# Patient Record
Sex: Female | Born: 1935 | Race: White | Hispanic: No | Smoking: Never smoker
Health system: Southern US, Community
[De-identification: ages and names within clinical notes are randomized; demographics above are authoritative.]

## PROBLEM LIST (undated history)

## (undated) DIAGNOSIS — M545 Low back pain, unspecified: Secondary | ICD-10-CM

## (undated) DIAGNOSIS — I10 Essential (primary) hypertension: Secondary | ICD-10-CM

## (undated) DIAGNOSIS — G459 Transient cerebral ischemic attack, unspecified: Secondary | ICD-10-CM

## (undated) DIAGNOSIS — I4510 Unspecified right bundle-branch block: Secondary | ICD-10-CM

## (undated) DIAGNOSIS — I639 Cerebral infarction, unspecified: Secondary | ICD-10-CM

## (undated) DIAGNOSIS — F99 Mental disorder, not otherwise specified: Secondary | ICD-10-CM

## (undated) DIAGNOSIS — R413 Other amnesia: Secondary | ICD-10-CM

## (undated) DIAGNOSIS — R51 Headache: Secondary | ICD-10-CM

## (undated) DIAGNOSIS — E785 Hyperlipidemia, unspecified: Secondary | ICD-10-CM

## (undated) DIAGNOSIS — R296 Repeated falls: Secondary | ICD-10-CM

## (undated) DIAGNOSIS — R519 Headache, unspecified: Secondary | ICD-10-CM

## (undated) DIAGNOSIS — Z9289 Personal history of other medical treatment: Secondary | ICD-10-CM

## (undated) DIAGNOSIS — R269 Unspecified abnormalities of gait and mobility: Secondary | ICD-10-CM

## (undated) DIAGNOSIS — M1990 Unspecified osteoarthritis, unspecified site: Secondary | ICD-10-CM

## (undated) DIAGNOSIS — C801 Malignant (primary) neoplasm, unspecified: Secondary | ICD-10-CM

## (undated) DIAGNOSIS — I480 Paroxysmal atrial fibrillation: Secondary | ICD-10-CM

## (undated) DIAGNOSIS — R410 Disorientation, unspecified: Secondary | ICD-10-CM

## (undated) DIAGNOSIS — R531 Weakness: Secondary | ICD-10-CM

## (undated) DIAGNOSIS — J029 Acute pharyngitis, unspecified: Secondary | ICD-10-CM

## (undated) HISTORY — DX: Acute pharyngitis, unspecified: J02.9

## (undated) HISTORY — DX: Hyperlipidemia, unspecified: E78.5

## (undated) HISTORY — DX: Unspecified osteoarthritis, unspecified site: M19.90

## (undated) HISTORY — DX: Disorientation, unspecified: R41.0

## (undated) HISTORY — PX: WRIST SURGERY: SHX841

## (undated) HISTORY — PX: OTHER SURGICAL HISTORY: SHX169

## (undated) HISTORY — DX: Headache, unspecified: R51.9

## (undated) HISTORY — PX: ABDOMINAL HYSTERECTOMY: SHX81

## (undated) HISTORY — DX: Low back pain, unspecified: M54.50

## (undated) HISTORY — DX: Essential (primary) hypertension: I10

## (undated) HISTORY — DX: Low back pain: M54.5

## (undated) HISTORY — PX: BACK SURGERY: SHX140

## (undated) HISTORY — DX: Unspecified right bundle-branch block: I45.10

## (undated) HISTORY — PX: MASTECTOMY: SHX3

## (undated) HISTORY — DX: Headache: R51

## (undated) HISTORY — DX: Malignant (primary) neoplasm, unspecified: C80.1

## (undated) HISTORY — PX: BREAST SURGERY: SHX581

## (undated) HISTORY — DX: Transient cerebral ischemic attack, unspecified: G45.9

## (undated) HISTORY — DX: Unspecified abnormalities of gait and mobility: R26.9

## (undated) HISTORY — DX: Paroxysmal atrial fibrillation: I48.0

## (undated) HISTORY — DX: Other amnesia: R41.3

## (undated) HISTORY — DX: Weakness: R53.1

## (undated) HISTORY — DX: Repeated falls: R29.6

## (undated) HISTORY — DX: Personal history of other medical treatment: Z92.89

---

## 1998-04-16 DIAGNOSIS — Z9289 Personal history of other medical treatment: Secondary | ICD-10-CM

## 1998-04-16 HISTORY — DX: Personal history of other medical treatment: Z92.89

## 1999-05-25 ENCOUNTER — Encounter: Admission: RE | Admit: 1999-05-25 | Discharge: 1999-05-25 | Payer: Self-pay | Admitting: Family Medicine

## 1999-05-25 ENCOUNTER — Encounter: Payer: Self-pay | Admitting: Family Medicine

## 1999-06-01 ENCOUNTER — Encounter: Admission: RE | Admit: 1999-06-01 | Discharge: 1999-06-01 | Payer: Self-pay | Admitting: Family Medicine

## 1999-06-01 ENCOUNTER — Encounter: Payer: Self-pay | Admitting: Family Medicine

## 2000-06-20 ENCOUNTER — Encounter: Admission: RE | Admit: 2000-06-20 | Discharge: 2000-06-20 | Payer: Self-pay | Admitting: Family Medicine

## 2000-06-20 ENCOUNTER — Encounter: Payer: Self-pay | Admitting: Family Medicine

## 2000-07-01 ENCOUNTER — Other Ambulatory Visit: Admission: RE | Admit: 2000-07-01 | Discharge: 2000-07-01 | Payer: Self-pay | Admitting: Family Medicine

## 2000-07-01 ENCOUNTER — Encounter: Payer: Self-pay | Admitting: Family Medicine

## 2000-07-01 ENCOUNTER — Encounter: Admission: RE | Admit: 2000-07-01 | Discharge: 2000-07-01 | Payer: Self-pay | Admitting: Family Medicine

## 2000-07-08 ENCOUNTER — Encounter: Admission: RE | Admit: 2000-07-08 | Discharge: 2000-07-08 | Payer: Self-pay | Admitting: General Surgery

## 2000-07-08 ENCOUNTER — Encounter: Payer: Self-pay | Admitting: General Surgery

## 2000-07-09 ENCOUNTER — Ambulatory Visit (HOSPITAL_BASED_OUTPATIENT_CLINIC_OR_DEPARTMENT_OTHER): Admission: RE | Admit: 2000-07-09 | Discharge: 2000-07-09 | Payer: Self-pay | Admitting: General Surgery

## 2000-07-09 ENCOUNTER — Encounter: Payer: Self-pay | Admitting: General Surgery

## 2000-07-09 ENCOUNTER — Encounter (INDEPENDENT_AMBULATORY_CARE_PROVIDER_SITE_OTHER): Payer: Self-pay | Admitting: *Deleted

## 2000-07-09 ENCOUNTER — Encounter: Admission: RE | Admit: 2000-07-09 | Discharge: 2000-07-09 | Payer: Self-pay | Admitting: General Surgery

## 2000-07-29 ENCOUNTER — Ambulatory Visit (HOSPITAL_BASED_OUTPATIENT_CLINIC_OR_DEPARTMENT_OTHER): Admission: RE | Admit: 2000-07-29 | Discharge: 2000-07-30 | Payer: Self-pay | Admitting: General Surgery

## 2000-07-29 ENCOUNTER — Encounter (INDEPENDENT_AMBULATORY_CARE_PROVIDER_SITE_OTHER): Payer: Self-pay | Admitting: Specialist

## 2000-07-29 ENCOUNTER — Encounter: Payer: Self-pay | Admitting: General Surgery

## 2000-08-06 ENCOUNTER — Encounter: Admission: RE | Admit: 2000-08-06 | Discharge: 2000-11-04 | Payer: Self-pay | Admitting: Radiation Oncology

## 2000-09-30 ENCOUNTER — Encounter: Admission: RE | Admit: 2000-09-30 | Discharge: 2000-11-01 | Payer: Self-pay | Admitting: Radiation Oncology

## 2001-01-14 ENCOUNTER — Encounter: Payer: Self-pay | Admitting: General Surgery

## 2001-01-14 ENCOUNTER — Encounter: Admission: RE | Admit: 2001-01-14 | Discharge: 2001-01-14 | Payer: Self-pay | Admitting: General Surgery

## 2001-03-10 ENCOUNTER — Encounter: Admission: RE | Admit: 2001-03-10 | Discharge: 2001-05-13 | Payer: Self-pay | Admitting: Family Medicine

## 2001-06-24 ENCOUNTER — Encounter: Admission: RE | Admit: 2001-06-24 | Discharge: 2001-06-24 | Payer: Self-pay | Admitting: *Deleted

## 2001-06-24 ENCOUNTER — Encounter: Payer: Self-pay | Admitting: *Deleted

## 2001-09-15 ENCOUNTER — Ambulatory Visit (HOSPITAL_COMMUNITY): Admission: RE | Admit: 2001-09-15 | Discharge: 2001-09-15 | Payer: Self-pay | Admitting: Gastroenterology

## 2002-06-03 ENCOUNTER — Ambulatory Visit (HOSPITAL_COMMUNITY): Admission: RE | Admit: 2002-06-03 | Discharge: 2002-06-03 | Payer: Self-pay | Admitting: *Deleted

## 2002-06-03 ENCOUNTER — Encounter: Payer: Self-pay | Admitting: *Deleted

## 2002-06-29 ENCOUNTER — Encounter: Admission: RE | Admit: 2002-06-29 | Discharge: 2002-06-29 | Payer: Self-pay | Admitting: Family Medicine

## 2002-06-29 ENCOUNTER — Encounter: Payer: Self-pay | Admitting: Family Medicine

## 2002-12-24 ENCOUNTER — Encounter: Payer: Self-pay | Admitting: Oncology

## 2002-12-24 ENCOUNTER — Encounter: Admission: RE | Admit: 2002-12-24 | Discharge: 2002-12-24 | Payer: Self-pay | Admitting: Oncology

## 2002-12-31 ENCOUNTER — Encounter: Payer: Self-pay | Admitting: Oncology

## 2002-12-31 ENCOUNTER — Encounter: Admission: RE | Admit: 2002-12-31 | Discharge: 2002-12-31 | Payer: Self-pay | Admitting: Oncology

## 2003-07-06 ENCOUNTER — Encounter: Admission: RE | Admit: 2003-07-06 | Discharge: 2003-07-06 | Payer: Self-pay | Admitting: General Surgery

## 2004-01-03 ENCOUNTER — Ambulatory Visit (HOSPITAL_COMMUNITY): Admission: RE | Admit: 2004-01-03 | Discharge: 2004-01-03 | Payer: Self-pay | Admitting: Oncology

## 2004-04-06 ENCOUNTER — Ambulatory Visit: Payer: Self-pay | Admitting: Oncology

## 2004-07-06 ENCOUNTER — Encounter: Admission: RE | Admit: 2004-07-06 | Discharge: 2004-07-06 | Payer: Self-pay | Admitting: Oncology

## 2004-07-12 ENCOUNTER — Encounter: Admission: RE | Admit: 2004-07-12 | Discharge: 2004-07-12 | Payer: Self-pay | Admitting: Oncology

## 2004-07-14 ENCOUNTER — Ambulatory Visit: Payer: Self-pay | Admitting: Oncology

## 2004-10-13 ENCOUNTER — Ambulatory Visit: Payer: Self-pay | Admitting: Oncology

## 2004-12-25 ENCOUNTER — Encounter: Admission: RE | Admit: 2004-12-25 | Discharge: 2004-12-25 | Payer: Self-pay | Admitting: Oncology

## 2005-04-19 ENCOUNTER — Ambulatory Visit: Payer: Self-pay | Admitting: Oncology

## 2005-05-30 ENCOUNTER — Encounter: Admission: RE | Admit: 2005-05-30 | Discharge: 2005-05-30 | Payer: Self-pay | Admitting: Specialist

## 2005-07-09 ENCOUNTER — Encounter: Admission: RE | Admit: 2005-07-09 | Discharge: 2005-07-09 | Payer: Self-pay | Admitting: Oncology

## 2005-11-13 ENCOUNTER — Ambulatory Visit: Payer: Self-pay | Admitting: Oncology

## 2005-11-15 LAB — CANCER ANTIGEN 27.29: CA 27.29: 46 U/mL — ABNORMAL HIGH (ref 0–39)

## 2005-11-15 LAB — CBC WITH DIFFERENTIAL/PLATELET
Basophils Absolute: 0 10*3/uL (ref 0.0–0.1)
Eosinophils Absolute: 0.2 10*3/uL (ref 0.0–0.5)
HCT: 36.1 % (ref 34.8–46.6)
HGB: 12.3 g/dL (ref 11.6–15.9)
LYMPH%: 30.6 % (ref 14.0–48.0)
MCV: 89.7 fL (ref 81.0–101.0)
MONO#: 0.4 10*3/uL (ref 0.1–0.9)
MONO%: 7.7 % (ref 0.0–13.0)
NEUT#: 3.4 10*3/uL (ref 1.5–6.5)
NEUT%: 58.3 % (ref 39.6–76.8)
Platelets: 243 10*3/uL (ref 145–400)
WBC: 5.8 10*3/uL (ref 3.9–10.0)

## 2005-11-15 LAB — COMPREHENSIVE METABOLIC PANEL
ALT: 9 U/L (ref 0–40)
BUN: 19 mg/dL (ref 6–23)
CO2: 28 mEq/L (ref 19–32)
Calcium: 9.2 mg/dL (ref 8.4–10.5)
Chloride: 103 mEq/L (ref 96–112)
Creatinine, Ser: 0.78 mg/dL (ref 0.40–1.20)
Glucose, Bld: 204 mg/dL — ABNORMAL HIGH (ref 70–99)
Total Bilirubin: 0.3 mg/dL (ref 0.3–1.2)

## 2005-11-15 LAB — LACTATE DEHYDROGENASE: LDH: 131 U/L (ref 94–250)

## 2005-11-21 ENCOUNTER — Ambulatory Visit (HOSPITAL_COMMUNITY): Admission: RE | Admit: 2005-11-21 | Discharge: 2005-11-21 | Payer: Self-pay | Admitting: Neurosurgery

## 2005-12-31 ENCOUNTER — Ambulatory Visit (HOSPITAL_COMMUNITY): Admission: RE | Admit: 2005-12-31 | Discharge: 2005-12-31 | Payer: Self-pay | Admitting: Oncology

## 2006-05-20 ENCOUNTER — Ambulatory Visit: Payer: Self-pay | Admitting: Oncology

## 2006-05-23 LAB — CBC WITH DIFFERENTIAL/PLATELET
BASO%: 0.5 % (ref 0.0–2.0)
EOS%: 2 % (ref 0.0–7.0)
LYMPH%: 27.6 % (ref 14.0–48.0)
MCHC: 35.4 g/dL (ref 32.0–36.0)
MCV: 87.9 fL (ref 81.0–101.0)
MONO%: 7.5 % (ref 0.0–13.0)
Platelets: 264 10*3/uL (ref 145–400)
RBC: 4.22 10*6/uL (ref 3.70–5.32)

## 2006-05-23 LAB — COMPREHENSIVE METABOLIC PANEL
ALT: 11 U/L (ref 0–35)
Alkaline Phosphatase: 77 U/L (ref 39–117)
Glucose, Bld: 137 mg/dL — ABNORMAL HIGH (ref 70–99)
Sodium: 143 mEq/L (ref 135–145)
Total Bilirubin: 0.4 mg/dL (ref 0.3–1.2)
Total Protein: 7 g/dL (ref 6.0–8.3)

## 2006-05-23 LAB — LACTATE DEHYDROGENASE: LDH: 135 U/L (ref 94–250)

## 2006-07-12 ENCOUNTER — Encounter: Admission: RE | Admit: 2006-07-12 | Discharge: 2006-07-12 | Payer: Self-pay | Admitting: Oncology

## 2006-12-19 ENCOUNTER — Ambulatory Visit: Payer: Self-pay | Admitting: Oncology

## 2006-12-23 LAB — COMPREHENSIVE METABOLIC PANEL
AST: 17 U/L (ref 0–37)
BUN: 20 mg/dL (ref 6–23)
CO2: 27 mEq/L (ref 19–32)
Calcium: 9.8 mg/dL (ref 8.4–10.5)
Chloride: 105 mEq/L (ref 96–112)
Creatinine, Ser: 0.81 mg/dL (ref 0.40–1.20)
Glucose, Bld: 136 mg/dL — ABNORMAL HIGH (ref 70–99)

## 2006-12-23 LAB — CBC WITH DIFFERENTIAL/PLATELET
Basophils Absolute: 0 10*3/uL (ref 0.0–0.1)
EOS%: 3.1 % (ref 0.0–7.0)
HCT: 36.4 % (ref 34.8–46.6)
HGB: 12.8 g/dL (ref 11.6–15.9)
MCH: 30.9 pg (ref 26.0–34.0)
NEUT%: 62.1 % (ref 39.6–76.8)
lymph#: 2.2 10*3/uL (ref 0.9–3.3)

## 2006-12-23 LAB — CANCER ANTIGEN 27.29: CA 27.29: 42 U/mL — ABNORMAL HIGH (ref 0–39)

## 2006-12-23 LAB — LACTATE DEHYDROGENASE: LDH: 142 U/L (ref 94–250)

## 2007-02-19 ENCOUNTER — Encounter: Admission: RE | Admit: 2007-02-19 | Discharge: 2007-02-19 | Payer: Self-pay | Admitting: Family Medicine

## 2007-07-10 ENCOUNTER — Ambulatory Visit: Payer: Self-pay | Admitting: Oncology

## 2007-07-14 ENCOUNTER — Encounter: Admission: RE | Admit: 2007-07-14 | Discharge: 2007-07-14 | Payer: Self-pay | Admitting: Oncology

## 2007-07-14 LAB — CBC WITH DIFFERENTIAL/PLATELET
BASO%: 0.1 % (ref 0.0–2.0)
HCT: 37.3 % (ref 34.8–46.6)
LYMPH%: 32.4 % (ref 14.0–48.0)
MCH: 31.3 pg (ref 26.0–34.0)
MCHC: 34.7 g/dL (ref 32.0–36.0)
MCV: 90.1 fL (ref 81.0–101.0)
MONO%: 7.4 % (ref 0.0–13.0)
NEUT%: 57.9 % (ref 39.6–76.8)
Platelets: 238 10*3/uL (ref 145–400)
RBC: 4.14 10*6/uL (ref 3.70–5.32)
WBC: 8.1 10*3/uL (ref 3.9–10.0)

## 2007-07-14 LAB — COMPREHENSIVE METABOLIC PANEL
ALT: 13 U/L (ref 0–35)
AST: 18 U/L (ref 0–37)
BUN: 15 mg/dL (ref 6–23)
Calcium: 9.8 mg/dL (ref 8.4–10.5)
Creatinine, Ser: 0.84 mg/dL (ref 0.40–1.20)
Total Bilirubin: 0.5 mg/dL (ref 0.3–1.2)

## 2007-07-14 LAB — CANCER ANTIGEN 27.29: CA 27.29: 43 U/mL — ABNORMAL HIGH (ref 0–39)

## 2008-07-09 ENCOUNTER — Ambulatory Visit: Payer: Self-pay | Admitting: Oncology

## 2008-07-14 ENCOUNTER — Encounter: Admission: RE | Admit: 2008-07-14 | Discharge: 2008-07-14 | Payer: Self-pay | Admitting: Oncology

## 2008-07-14 LAB — CBC WITH DIFFERENTIAL/PLATELET
BASO%: 0.3 % (ref 0.0–2.0)
EOS%: 2.2 % (ref 0.0–7.0)
HCT: 39.3 % (ref 34.8–46.6)
HGB: 13.2 g/dL (ref 11.6–15.9)
MCH: 31 pg (ref 25.1–34.0)
MCHC: 33.6 g/dL (ref 31.5–36.0)
MONO#: 0.5 10*3/uL (ref 0.1–0.9)
NEUT%: 65.8 % (ref 38.4–76.8)
RDW: 14.3 % (ref 11.2–14.5)
WBC: 7.4 10*3/uL (ref 3.9–10.3)
lymph#: 1.9 10*3/uL (ref 0.9–3.3)

## 2008-07-15 ENCOUNTER — Encounter: Admission: RE | Admit: 2008-07-15 | Discharge: 2008-07-15 | Payer: Self-pay | Admitting: Oncology

## 2008-07-15 LAB — COMPREHENSIVE METABOLIC PANEL
ALT: 11 U/L (ref 0–35)
AST: 18 U/L (ref 0–37)
Albumin: 4 g/dL (ref 3.5–5.2)
CO2: 29 mEq/L (ref 19–32)
Calcium: 9.6 mg/dL (ref 8.4–10.5)
Chloride: 103 mEq/L (ref 96–112)
Creatinine, Ser: 0.89 mg/dL (ref 0.40–1.20)
Potassium: 4 mEq/L (ref 3.5–5.3)
Total Protein: 6.7 g/dL (ref 6.0–8.3)

## 2008-07-15 LAB — LACTATE DEHYDROGENASE: LDH: 147 U/L (ref 94–250)

## 2009-07-13 ENCOUNTER — Encounter: Admission: RE | Admit: 2009-07-13 | Discharge: 2009-07-13 | Payer: Self-pay | Admitting: Sports Medicine

## 2009-07-20 ENCOUNTER — Encounter: Admission: RE | Admit: 2009-07-20 | Discharge: 2009-07-20 | Payer: Self-pay | Admitting: Sports Medicine

## 2009-07-21 ENCOUNTER — Encounter: Admission: RE | Admit: 2009-07-21 | Discharge: 2009-07-21 | Payer: Self-pay | Admitting: Oncology

## 2009-08-10 ENCOUNTER — Ambulatory Visit: Payer: Self-pay | Admitting: Oncology

## 2009-08-11 LAB — CBC WITH DIFFERENTIAL/PLATELET
Eosinophils Absolute: 0.3 10*3/uL (ref 0.0–0.5)
HCT: 39.3 % (ref 34.8–46.6)
HGB: 13.5 g/dL (ref 11.6–15.9)
LYMPH%: 29.2 % (ref 14.0–49.7)
MONO#: 0.7 10*3/uL (ref 0.1–0.9)
NEUT#: 5.6 10*3/uL (ref 1.5–6.5)
NEUT%: 60.2 % (ref 38.4–76.8)
Platelets: 197 10*3/uL (ref 145–400)
WBC: 9.3 10*3/uL (ref 3.9–10.3)
lymph#: 2.7 10*3/uL (ref 0.9–3.3)

## 2009-08-11 LAB — LACTATE DEHYDROGENASE: LDH: 179 U/L (ref 94–250)

## 2009-08-11 LAB — COMPREHENSIVE METABOLIC PANEL
CO2: 31 mEq/L (ref 19–32)
Calcium: 9.5 mg/dL (ref 8.4–10.5)
Chloride: 106 mEq/L (ref 96–112)
Creatinine, Ser: 0.81 mg/dL (ref 0.40–1.20)
Glucose, Bld: 116 mg/dL — ABNORMAL HIGH (ref 70–99)
Total Bilirubin: 0.5 mg/dL (ref 0.3–1.2)
Total Protein: 6.5 g/dL (ref 6.0–8.3)

## 2009-08-11 LAB — CANCER ANTIGEN 27.29: CA 27.29: 29 U/mL (ref 0–39)

## 2009-08-24 ENCOUNTER — Encounter: Admission: RE | Admit: 2009-08-24 | Discharge: 2009-08-24 | Payer: Self-pay | Admitting: Sports Medicine

## 2009-09-29 ENCOUNTER — Encounter: Admission: RE | Admit: 2009-09-29 | Discharge: 2009-09-29 | Payer: Self-pay | Admitting: Sports Medicine

## 2009-10-15 ENCOUNTER — Emergency Department (HOSPITAL_COMMUNITY): Admission: EM | Admit: 2009-10-15 | Discharge: 2009-10-16 | Payer: Self-pay | Admitting: Emergency Medicine

## 2009-10-19 ENCOUNTER — Ambulatory Visit: Payer: Self-pay | Admitting: Vascular Surgery

## 2009-11-09 ENCOUNTER — Ambulatory Visit (HOSPITAL_COMMUNITY): Admission: RE | Admit: 2009-11-09 | Discharge: 2009-11-09 | Payer: Self-pay | Admitting: Family Medicine

## 2009-12-08 ENCOUNTER — Inpatient Hospital Stay (HOSPITAL_COMMUNITY): Admission: RE | Admit: 2009-12-08 | Discharge: 2009-12-09 | Payer: Self-pay | Admitting: Neurosurgery

## 2010-02-07 ENCOUNTER — Inpatient Hospital Stay (HOSPITAL_COMMUNITY): Admission: EM | Admit: 2010-02-07 | Discharge: 2010-02-16 | Payer: Self-pay | Admitting: Emergency Medicine

## 2010-02-07 ENCOUNTER — Encounter (INDEPENDENT_AMBULATORY_CARE_PROVIDER_SITE_OTHER): Payer: Self-pay | Admitting: Cardiovascular Disease

## 2010-02-07 HISTORY — PX: CARDIAC CATHETERIZATION: SHX172

## 2010-02-11 HISTORY — PX: LAPAROSCOPIC CHOLECYSTECTOMY: SUR755

## 2010-02-13 ENCOUNTER — Encounter (INDEPENDENT_AMBULATORY_CARE_PROVIDER_SITE_OTHER): Payer: Self-pay | Admitting: Cardiovascular Disease

## 2010-05-06 ENCOUNTER — Other Ambulatory Visit: Payer: Self-pay | Admitting: Oncology

## 2010-05-06 DIAGNOSIS — Z1231 Encounter for screening mammogram for malignant neoplasm of breast: Secondary | ICD-10-CM

## 2010-06-27 LAB — BASIC METABOLIC PANEL
BUN: 15 mg/dL (ref 6–23)
CO2: 30 mEq/L (ref 19–32)
Chloride: 99 mEq/L (ref 96–112)
GFR calc non Af Amer: 53 mL/min — ABNORMAL LOW (ref >60)
Glucose, Bld: 155 mg/dL — ABNORMAL HIGH (ref 70–99)
Potassium: 3.9 mEq/L (ref 3.5–5.1)
Sodium: 138 mEq/L (ref 135–145)

## 2010-06-27 LAB — COMPREHENSIVE METABOLIC PANEL
ALT: 40 U/L — ABNORMAL HIGH (ref 0–35)
ALT: 65 U/L — ABNORMAL HIGH (ref 0–35)
AST: 27 U/L (ref 0–37)
AST: 37 U/L (ref 0–37)
Albumin: 2.8 g/dL — ABNORMAL LOW (ref 3.5–5.2)
Albumin: 3 g/dL — ABNORMAL LOW (ref 3.5–5.2)
CO2: 31 mEq/L (ref 19–32)
Calcium: 9.1 mg/dL (ref 8.4–10.5)
Calcium: 9.2 mg/dL (ref 8.4–10.5)
Chloride: 100 mEq/L (ref 96–112)
Creatinine, Ser: 0.89 mg/dL (ref 0.4–1.2)
Creatinine, Ser: 1.03 mg/dL (ref 0.4–1.2)
GFR calc Af Amer: >60 mL/min (ref >60)
GFR calc Af Amer: >60 mL/min (ref >60)
GFR calc non Af Amer: >60 mL/min (ref >60)
Sodium: 140 mEq/L (ref 135–145)
Sodium: 140 mEq/L (ref 135–145)
Total Protein: 6.1 g/dL (ref 6.0–8.3)

## 2010-06-27 LAB — GLUCOSE, CAPILLARY
Glucose-Capillary: 152 mg/dL — ABNORMAL HIGH (ref 70–99)
Glucose-Capillary: 192 mg/dL — ABNORMAL HIGH (ref 70–99)
Glucose-Capillary: 237 mg/dL — ABNORMAL HIGH (ref 70–99)

## 2010-06-27 LAB — CBC
Hemoglobin: 11.3 g/dL — ABNORMAL LOW (ref 12.0–15.0)
Hemoglobin: 11.7 g/dL — ABNORMAL LOW (ref 12.0–15.0)
MCH: 28.5 pg (ref 26.0–34.0)
MCH: 28.9 pg (ref 26.0–34.0)
MCHC: 32.3 g/dL (ref 30.0–36.0)
Platelets: 220 10*3/uL (ref 150–400)
Platelets: 237 10*3/uL (ref 150–400)
RBC: 4.11 MIL/uL (ref 3.87–5.11)

## 2010-06-28 LAB — COMPREHENSIVE METABOLIC PANEL
AST: 48 U/L — ABNORMAL HIGH (ref 0–37)
AST: 78 U/L — ABNORMAL HIGH (ref 0–37)
Albumin: 2.7 g/dL — ABNORMAL LOW (ref 3.5–5.2)
Albumin: 2.7 g/dL — ABNORMAL LOW (ref 3.5–5.2)
Albumin: 3.1 g/dL — ABNORMAL LOW (ref 3.5–5.2)
Alkaline Phosphatase: 181 U/L — ABNORMAL HIGH (ref 39–117)
Alkaline Phosphatase: 211 U/L — ABNORMAL HIGH (ref 39–117)
Alkaline Phosphatase: 446 U/L — ABNORMAL HIGH (ref 39–117)
BUN: 10 mg/dL (ref 6–23)
BUN: 11 mg/dL (ref 6–23)
BUN: 12 mg/dL (ref 6–23)
BUN: 14 mg/dL (ref 6–23)
CO2: 25 mEq/L (ref 19–32)
CO2: 27 mEq/L (ref 19–32)
CO2: 28 mEq/L (ref 19–32)
Calcium: 8.7 mg/dL (ref 8.4–10.5)
Calcium: 9.6 mg/dL (ref 8.4–10.5)
Calcium: 9.7 mg/dL (ref 8.4–10.5)
Chloride: 111 mEq/L (ref 96–112)
Chloride: 97 mEq/L (ref 96–112)
Creatinine, Ser: 0.74 mg/dL (ref 0.4–1.2)
Creatinine, Ser: 0.94 mg/dL (ref 0.4–1.2)
Creatinine, Ser: 0.98 mg/dL (ref 0.4–1.2)
GFR calc Af Amer: >60 mL/min (ref >60)
GFR calc Af Amer: >60 mL/min (ref >60)
GFR calc non Af Amer: 54 mL/min — ABNORMAL LOW (ref >60)
GFR calc non Af Amer: 57 mL/min — ABNORMAL LOW (ref >60)
GFR calc non Af Amer: >60 mL/min (ref >60)
Glucose, Bld: 162 mg/dL — ABNORMAL HIGH (ref 70–99)
Glucose, Bld: 164 mg/dL — ABNORMAL HIGH (ref 70–99)
Glucose, Bld: 185 mg/dL — ABNORMAL HIGH (ref 70–99)
Potassium: 3.5 mEq/L (ref 3.5–5.1)
Potassium: 3.7 mEq/L (ref 3.5–5.1)
Potassium: 4 mEq/L (ref 3.5–5.1)
Total Bilirubin: 1.5 mg/dL — ABNORMAL HIGH (ref 0.3–1.2)
Total Bilirubin: 2.2 mg/dL — ABNORMAL HIGH (ref 0.3–1.2)
Total Bilirubin: 3.1 mg/dL — ABNORMAL HIGH (ref 0.3–1.2)
Total Protein: 5.3 g/dL — ABNORMAL LOW (ref 6.0–8.3)
Total Protein: 6.5 g/dL (ref 6.0–8.3)

## 2010-06-28 LAB — CBC
HCT: 33.1 % — ABNORMAL LOW (ref 36.0–46.0)
HCT: 33.3 % — ABNORMAL LOW (ref 36.0–46.0)
HCT: 37.6 % (ref 36.0–46.0)
HCT: 42.8 % (ref 36.0–46.0)
Hemoglobin: 10.6 g/dL — ABNORMAL LOW (ref 12.0–15.0)
Hemoglobin: 12 g/dL (ref 12.0–15.0)
Hemoglobin: 12.3 g/dL (ref 12.0–15.0)
Hemoglobin: 14.1 g/dL (ref 12.0–15.0)
MCH: 28.5 pg (ref 26.0–34.0)
MCH: 28.7 pg (ref 26.0–34.0)
MCH: 28.8 pg (ref 26.0–34.0)
MCH: 29.5 pg (ref 26.0–34.0)
MCH: 29.7 pg (ref 26.0–34.0)
MCHC: 32 g/dL (ref 30.0–36.0)
MCHC: 32.9 g/dL (ref 30.0–36.0)
MCHC: 32.9 g/dL (ref 30.0–36.0)
MCHC: 33 g/dL (ref 30.0–36.0)
MCV: 89.5 fL (ref 78.0–100.0)
MCV: 89.5 fL (ref 78.0–100.0)
MCV: 89.7 fL (ref 78.0–100.0)
MCV: 89.7 fL (ref 78.0–100.0)
MCV: 90.3 fL (ref 78.0–100.0)
MCV: 90.3 fL (ref 78.0–100.0)
Platelets: 121 10*3/uL — ABNORMAL LOW (ref 150–400)
Platelets: 134 10*3/uL — ABNORMAL LOW (ref 150–400)
Platelets: 135 10*3/uL — ABNORMAL LOW (ref 150–400)
Platelets: 148 10*3/uL — ABNORMAL LOW (ref 150–400)
Platelets: 208 10*3/uL (ref 150–400)
RBC: 3.72 MIL/uL — ABNORMAL LOW (ref 3.87–5.11)
RBC: 3.92 MIL/uL (ref 3.87–5.11)
RDW: 14.4 % (ref 11.5–15.5)
RDW: 14.5 % (ref 11.5–15.5)
RDW: 14.5 % (ref 11.5–15.5)
RDW: 14.9 % (ref 11.5–15.5)
WBC: 10.7 10*3/uL — ABNORMAL HIGH (ref 4.0–10.5)
WBC: 5.5 10*3/uL (ref 4.0–10.5)
WBC: 5.6 10*3/uL (ref 4.0–10.5)
WBC: 7.4 10*3/uL (ref 4.0–10.5)
WBC: 7.5 10*3/uL (ref 4.0–10.5)

## 2010-06-28 LAB — BASIC METABOLIC PANEL
BUN: 12 mg/dL (ref 6–23)
CO2: 29 mEq/L (ref 19–32)
Calcium: 9.1 mg/dL (ref 8.4–10.5)
Creatinine, Ser: 0.78 mg/dL (ref 0.4–1.2)
Glucose, Bld: 164 mg/dL — ABNORMAL HIGH (ref 70–99)

## 2010-06-28 LAB — GLUCOSE, CAPILLARY
Glucose-Capillary: 102 mg/dL — ABNORMAL HIGH (ref 70–99)
Glucose-Capillary: 113 mg/dL — ABNORMAL HIGH (ref 70–99)
Glucose-Capillary: 118 mg/dL — ABNORMAL HIGH (ref 70–99)
Glucose-Capillary: 118 mg/dL — ABNORMAL HIGH (ref 70–99)
Glucose-Capillary: 129 mg/dL — ABNORMAL HIGH (ref 70–99)
Glucose-Capillary: 130 mg/dL — ABNORMAL HIGH (ref 70–99)
Glucose-Capillary: 142 mg/dL — ABNORMAL HIGH (ref 70–99)
Glucose-Capillary: 143 mg/dL — ABNORMAL HIGH (ref 70–99)
Glucose-Capillary: 148 mg/dL — ABNORMAL HIGH (ref 70–99)
Glucose-Capillary: 159 mg/dL — ABNORMAL HIGH (ref 70–99)
Glucose-Capillary: 159 mg/dL — ABNORMAL HIGH (ref 70–99)
Glucose-Capillary: 165 mg/dL — ABNORMAL HIGH (ref 70–99)
Glucose-Capillary: 165 mg/dL — ABNORMAL HIGH (ref 70–99)
Glucose-Capillary: 172 mg/dL — ABNORMAL HIGH (ref 70–99)
Glucose-Capillary: 174 mg/dL — ABNORMAL HIGH (ref 70–99)
Glucose-Capillary: 179 mg/dL — ABNORMAL HIGH (ref 70–99)
Glucose-Capillary: 184 mg/dL — ABNORMAL HIGH (ref 70–99)
Glucose-Capillary: 267 mg/dL — ABNORMAL HIGH (ref 70–99)

## 2010-06-28 LAB — CARDIAC PANEL(CRET KIN+CKTOT+MB+TROPI)
CK, MB: 1.1 ng/mL (ref 0.3–4.0)
Relative Index: INVALID (ref 0.0–2.5)
Relative Index: INVALID (ref 0.0–2.5)
Troponin I: 0.08 ng/mL — ABNORMAL HIGH (ref 0.00–0.06)
Troponin I: 0.15 ng/mL — ABNORMAL HIGH (ref 0.00–0.06)

## 2010-06-28 LAB — POCT I-STAT, CHEM 8
BUN: 11 mg/dL (ref 6–23)
Calcium, Ion: 1.11 mmol/L — ABNORMAL LOW (ref 1.12–1.32)
Chloride: 107 mEq/L (ref 96–112)
Creatinine, Ser: 0.7 mg/dL (ref 0.4–1.2)
Glucose, Bld: 237 mg/dL — ABNORMAL HIGH (ref 70–99)

## 2010-06-28 LAB — URINALYSIS, ROUTINE W REFLEX MICROSCOPIC
Ketones, ur: 15 mg/dL — AB
Nitrite: NEGATIVE
Protein, ur: NEGATIVE mg/dL

## 2010-06-28 LAB — MRSA PCR SCREENING: MRSA by PCR: NEGATIVE

## 2010-06-28 LAB — MITOCHONDRIAL ANTIBODIES: Mitochondrial M2 Ab, IgG: <0.6

## 2010-06-28 LAB — LIPASE, BLOOD: Lipase: 30 U/L (ref 11–59)

## 2010-06-28 LAB — POCT CARDIAC MARKERS: CKMB, poc: <1 ng/mL — ABNORMAL LOW (ref 1.0–8.0)

## 2010-06-28 LAB — TYPE AND SCREEN: ABO/RH(D): O POS

## 2010-06-28 LAB — HEPATITIS C ANTIBODY: HCV Ab: NEGATIVE

## 2010-06-28 LAB — DIFFERENTIAL
Basophils Absolute: 0 10*3/uL (ref 0.0–0.1)
Basophils Relative: 0 % (ref 0–1)
Eosinophils Absolute: 0.2 10*3/uL (ref 0.0–0.7)
Lymphocytes Relative: 7 % — ABNORMAL LOW (ref 12–46)
Lymphs Abs: 0.5 10*3/uL — ABNORMAL LOW (ref 0.7–4.0)
Monocytes Absolute: 0.5 10*3/uL (ref 0.1–1.0)
Monocytes Relative: 10 % (ref 3–12)
Monocytes Relative: 7 % (ref 3–12)
Neutro Abs: 6.5 10*3/uL (ref 1.7–7.7)
Neutrophils Relative %: 54 % (ref 43–77)

## 2010-06-28 LAB — APTT: aPTT: 67 seconds — ABNORMAL HIGH (ref 24–37)

## 2010-06-28 LAB — HEPARIN LEVEL (UNFRACTIONATED): Heparin Unfractionated: 0.26 IU/mL — ABNORMAL LOW (ref 0.30–0.70)

## 2010-06-28 LAB — HEMOGLOBIN A1C
Hgb A1c MFr Bld: 7.4 % — ABNORMAL HIGH (ref <5.7)
Mean Plasma Glucose: 166 mg/dL — ABNORMAL HIGH (ref <117)

## 2010-06-28 LAB — URINE CULTURE
Colony Count: >=100000
Culture  Setup Time: 201110251031

## 2010-06-28 LAB — LIPID PANEL
Cholesterol: 94 mg/dL (ref 0–200)
LDL Cholesterol: 51 mg/dL (ref 0–99)

## 2010-06-28 LAB — PROTEIN ELECTROPH W RFLX QUANT IMMUNOGLOBULINS
Alpha-1-Globulin: 6.5 % — ABNORMAL HIGH (ref 2.9–4.9)
Gamma Globulin: 13.6 % (ref 11.1–18.8)
M-Spike, %: NOT DETECTED g/dL

## 2010-06-28 LAB — ANA: Anti Nuclear Antibody(ANA): NEGATIVE

## 2010-06-28 LAB — HEPATIC FUNCTION PANEL
Albumin: 3.1 g/dL — ABNORMAL LOW (ref 3.5–5.2)
Total Bilirubin: 1.6 mg/dL — ABNORMAL HIGH (ref 0.3–1.2)

## 2010-06-28 LAB — BRAIN NATRIURETIC PEPTIDE: Pro B Natriuretic peptide (BNP): <30 pg/mL (ref 0.0–100.0)

## 2010-06-28 LAB — CK TOTAL AND CKMB (NOT AT ARMC): CK, MB: 1.7 ng/mL (ref 0.3–4.0)

## 2010-06-28 LAB — ABO/RH: ABO/RH(D): O POS

## 2010-06-28 LAB — PROTIME-INR: INR: 1.28 (ref 0.00–1.49)

## 2010-06-28 LAB — TSH: TSH: 0.31 u[IU]/mL — ABNORMAL LOW (ref 0.350–4.500)

## 2010-06-28 LAB — TROPONIN I: Troponin I: 0.08 ng/mL — ABNORMAL HIGH (ref 0.00–0.06)

## 2010-06-29 LAB — GLUCOSE, CAPILLARY
Glucose-Capillary: 154 mg/dL — ABNORMAL HIGH (ref 70–99)
Glucose-Capillary: 171 mg/dL — ABNORMAL HIGH (ref 70–99)
Glucose-Capillary: 183 mg/dL — ABNORMAL HIGH (ref 70–99)
Glucose-Capillary: 189 mg/dL — ABNORMAL HIGH (ref 70–99)
Glucose-Capillary: 301 mg/dL — ABNORMAL HIGH (ref 70–99)
Glucose-Capillary: 319 mg/dL — ABNORMAL HIGH (ref 70–99)

## 2010-06-29 LAB — BASIC METABOLIC PANEL
CO2: 32 mEq/L (ref 19–32)
Chloride: 105 mEq/L (ref 96–112)
GFR calc Af Amer: >60 mL/min (ref >60)
Glucose, Bld: 153 mg/dL — ABNORMAL HIGH (ref 70–99)
Sodium: 142 mEq/L (ref 135–145)

## 2010-06-29 LAB — URINALYSIS, ROUTINE W REFLEX MICROSCOPIC
Glucose, UA: NEGATIVE mg/dL
Ketones, ur: 15 mg/dL — AB
Nitrite: NEGATIVE
Protein, ur: NEGATIVE mg/dL
Urobilinogen, UA: 1 mg/dL (ref 0.0–1.0)

## 2010-06-29 LAB — URINE MICROSCOPIC-ADD ON

## 2010-06-29 LAB — CBC
HCT: 36.2 % (ref 36.0–46.0)
Hemoglobin: 12.3 g/dL (ref 12.0–15.0)
MCH: 31.1 pg (ref 26.0–34.0)
MCHC: 34 g/dL (ref 30.0–36.0)
MCV: 91.4 fL (ref 78.0–100.0)
RBC: 3.96 MIL/uL (ref 3.87–5.11)

## 2010-06-29 LAB — APTT: aPTT: 26 seconds (ref 24–37)

## 2010-06-29 LAB — SURGICAL PCR SCREEN: MRSA, PCR: NEGATIVE

## 2010-07-25 ENCOUNTER — Ambulatory Visit
Admission: RE | Admit: 2010-07-25 | Discharge: 2010-07-25 | Disposition: A | Discharge status: Home / Self Care | Payer: Medicare Other | Source: Ambulatory Visit | Attending: Oncology | Admitting: Oncology

## 2010-07-25 DIAGNOSIS — Z1231 Encounter for screening mammogram for malignant neoplasm of breast: Secondary | ICD-10-CM

## 2010-08-09 ENCOUNTER — Other Ambulatory Visit: Payer: Self-pay | Admitting: Oncology

## 2010-08-09 ENCOUNTER — Encounter (HOSPITAL_BASED_OUTPATIENT_CLINIC_OR_DEPARTMENT_OTHER): Payer: Medicare Other | Admitting: Oncology

## 2010-08-09 DIAGNOSIS — C50919 Malignant neoplasm of unspecified site of unspecified female breast: Secondary | ICD-10-CM

## 2010-08-09 LAB — CBC WITH DIFFERENTIAL/PLATELET
BASO%: 0.4 % (ref 0.0–2.0)
Basophils Absolute: 0 10*3/uL (ref 0.0–0.1)
EOS%: 2.3 % (ref 0.0–7.0)
HCT: 36.8 % (ref 34.8–46.6)
HGB: 12.5 g/dL (ref 11.6–15.9)
LYMPH%: 22.3 % (ref 14.0–49.7)
MCH: 29.6 pg (ref 25.1–34.0)
MCHC: 33.9 g/dL (ref 31.5–36.0)
MONO#: 0.5 10*3/uL (ref 0.1–0.9)
NEUT%: 68.4 % (ref 38.4–76.8)
Platelets: 209 10*3/uL (ref 145–400)
lymph#: 1.8 10*3/uL (ref 0.9–3.3)

## 2010-08-10 LAB — COMPREHENSIVE METABOLIC PANEL
ALT: 12 U/L (ref 0–35)
BUN: 19 mg/dL (ref 6–23)
CO2: 25 mEq/L (ref 19–32)
Calcium: 9.9 mg/dL (ref 8.4–10.5)
Chloride: 104 mEq/L (ref 96–112)
Creatinine, Ser: 0.87 mg/dL (ref 0.40–1.20)
Total Bilirubin: 0.4 mg/dL (ref 0.3–1.2)

## 2010-08-10 LAB — LACTATE DEHYDROGENASE: LDH: 146 U/L (ref 94–250)

## 2010-08-16 ENCOUNTER — Encounter (HOSPITAL_BASED_OUTPATIENT_CLINIC_OR_DEPARTMENT_OTHER): Payer: Medicare Other | Admitting: Oncology

## 2010-08-16 DIAGNOSIS — E559 Vitamin D deficiency, unspecified: Secondary | ICD-10-CM

## 2010-08-16 DIAGNOSIS — C50919 Malignant neoplasm of unspecified site of unspecified female breast: Secondary | ICD-10-CM

## 2010-08-17 ENCOUNTER — Other Ambulatory Visit: Payer: Self-pay | Admitting: Oncology

## 2010-08-17 DIAGNOSIS — Z1231 Encounter for screening mammogram for malignant neoplasm of breast: Secondary | ICD-10-CM

## 2010-08-29 NOTE — Assessment & Plan Note (Signed)
OFFICE VISIT   RADONNA, BRACHER I  DOB:  11-15-1925                                       10/19/2009  TDDUK#:[id number]   ADDENDUM:  I ordered, reviewed and interpreted Ms. Morneault' ABIs from  the vascular lab today.     Janetta Hora. Fields, MD  Electronically Signed   CEF/MEDQ  D:  10/19/2009  T:  10/20/2009  Job:  [id number]

## 2010-08-29 NOTE — Assessment & Plan Note (Signed)
OFFICE VISIT   Sara [name], Sara [name] [name]  DOB:  03-Dec-1925                                       10/19/2009  ZOXWR#:[id number]   The patient is an 77 year old female referred by Dr. Herd Scotland for  evaluation of lower extremity arterial occlusive disease.   The patient states that for the last 3 months she has had pain in her  back with radiation to her hip and left foot.  She states that the back  pain has improved somewhat after receiving injections from Dr. Margaretha Sheffield  but has not totally resolved.  She states that primarily at this point,  however, that she has pain in her calf and knee.  She states that  currently she can get out of bed in the morning and 2 or 3 steps later,  she begins to have pain that starts in her knee and radiates down into  her calf.  She denies any pain in her foot.  She has no numbness or  tingling in the feet.  She states that occasionally her feet do swell up  and they have a dusky appearance to them.  She does not really describe  classic claudication problems as her symptoms are not completely  reproducible and are intermittent in nature and sometimes worse on  certain days than others.   Chronic medical problems include diabetes which she has had for 10 years  and hypertension.  She also has a previous history of breast cancer in  2002 and is followed by Dr. Donnie Coffin for this.  Her diabetes and  hypertension are well-controlled and followed by Dr. Klett Scotland.  She denies  prior history of tobacco abuse or elevated cholesterol.   PAST SURGICAL HISTORY:  Hysterectomy.   PAST MEDICAL HISTORY:  As mentioned above.   SOCIAL HISTORY:  She is widowed.  She has 3 children.  She is retired.  She does not smoke or consume alcohol regularly.   FAMILY HISTORY:  Unremarkable.   REVIEW OF SYSTEMS:  A 12 point review of systems was performed with the  patient today.  Please see intake referral form for details regarding  this.   MEDICATIONS:  Medications  include Flexeril 10 mg p.r.n., Micardis  40/12.5 once a day, glucosamine, Inderal 60 mg once a day, calcium 600  mg 2 daily, Glucotrol XL 5 mg once a day, Lasix 20 mg once a day, Simcor  20/500 two p.o. q.h.s. aspirin 81 mg once a day, Aleve p.r.n.   ALLERGIES:  She has allergy listed to codeine which causes nausea.   PHYSICAL EXAMINATION:  Blood pressure is 112/67 in the left arm, heart  rate 52 and regular, respirations 16.  HEENT:  Remarkable for bilateral eye ecchymosis which occurred from a  recent fall.  NECK:  Has 2+ carotid pulses without bruit.  CHEST:  Clear to auscultation.  CARDIAC:  Regular rate and rhythm without murmur.  ABDOMEN:  Obese, soft, nontender, nondistended.  No masses.  EXTREMITIES:  She has no significant lower or upper extremity edema  today.  She has 2+ radial, 2+ femoral and 2+ dorsalis pedis pulses  bilaterally.  Posterior tibial pulses are not easily palpated and  popliteal pulses are not easily palpated but this is most likely due to  the fact that the patient has very large legs.  MUSCULOSKELETAL:  Exam shows no obvious  major joint deformities.  NEUROLOGIC:  Exam shows symmetric upper extremity and lower extremity  motor strength which is 5/5.  SKIN:  No open ulcers or rashes.  She does have ecchymosis over her  right shoulder, her right knee and the pre mentioned periorbital  ecchymosis.   She had bilateral ABIs performed today which were normal triphasic  waveforms with normal ABIs of 1.26 on the right and 1.31 on the left.   In summary, Sara [name] [name] has a normal arterial exam on physical exam by  palpation of normal dorsalis pedis pulses.  She also has normal ABIs  bilaterally.  I do not believe the pain is related to lower extremity  arterial occlusive disease.  [name] discussed with the patient and her  daughter today that this seems more musculoskeletal or orthopedic in  nature but that she had a completely normal arterial exam on noninvasive   testing and on physical exam today.  She will follow up either with Dr.  Owczarzak Scotland, Dr. Margaretha Sheffield or Dr. Phoebe Perch regarding her leg problems for further  evaluation.  She will follow up with me on an as-needed basis.     Janetta Hora. Fields, MD  Electronically Signed   CEF/MEDQ  D:  10/19/2009  T:  10/20/2009  Job:  [id number]   cc:   Tammy R. Carawan Scotland, M.D.  Clydene Fake, M.D.

## 2010-09-01 NOTE — Op Note (Signed)
[place]  Patient:    Sara [name], Sara [name]                       MRN: [id number] Proc. Date: 07/09/00 Adm. Date:  [id number] Attending:  Janalyn Rouse CC:         Tammy R. Seiple Scotland, M.D.   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  PROCEDURE:  Left partial mastectomy with wire localization.  SURGEON:  Rose Phi. Maple Hudson, M.D.  ANESTHESIA:  General.  OPERATIVE PROCEDURE:  This 77 year old white female had presented for routine mammography and was found to have 2 areas of microcalcifications in the upper portion of her left breast.  Needle core biopsy done on one of the microcalcifications showed high grade DCIS without evidence of invasion. We are going to do a partial mastectomy and have wire bracketing of both microcalcifications to include them in the partial mastectomy specimen.  After suitable general anesthesia was induced the patient was placed in the supine position with the left breast prepped and draped in the usual fashion. Using the 2 previously placed wires in the upper portion of the breast as a guide a curvilinear incision was outlined and then having made the incision I undermined superiorly and inferiorly in order to incorporate both wires within the specimen.  Using the cautery I then widely excised the wires and the surrounding tissue. Hemostasis obtained with a cautery.  Specimen mammography confirmed the removaL of both areas of microcalcifications.  A subcuticular closure of 4-0 monacryl and Steri-Strips were carried out.  Dressing applied.  The patient was transferred to the recovery room in satisfactory condition having tolerated the procedure well. DD:  07/09/00 TD:  07/09/00 Job: [id number]

## 2010-09-01 NOTE — Op Note (Signed)
[facility]  Patient:    Sara [name], Sara [name] Visit Number: [id number] MRN: [id number]          Service Type: END Location: ENDO Attending Physician:  Louie Bun Dictated by:   Everardo All Madilyn Fireman, M.D. Proc. Date: 09/15/01 Admit Date:  09/15/2001   CC:         Tammy R. Meiners Scotland, M.D.   Operative Report  INDICATIONS FOR PROCEDURE:  Rectal bleeding and a personal history of breast cancer.  PROCEDURE:  The patient was placed in the left lateral decubitus position and placed on the pulse monitor with continuous low-flow oxygen delivered by nasal cannula. He was sedated with 50 mg IV Demerol and 5 mg IV Versed. The Olympus video colonoscope is inserted into the rectum and advanced to the cecum, confirmed by transillumination of McBurneys point and visualization of the ileocecal valve and appendiceal orifice. The prep was excellent. The cecum, ascending, transverse, descending, sigmoid colon all appeared normal. No masses, polyps, diverticula, or other mucosal abnormalities. The rectum likewise appeared normal. Retroflex view of the anus revealed some small internal hemorrhoids. The colonoscope is then withdrawn and the patient returned to the recovery room in stable condition. She tolerated the procedure well and there were no immediate complications.  IMPRESSION:  Internal hemorrhoids otherwise, normal colonoscopy.  PLAN: 1. Treat hemorrhoids symptomatically. 2. Repeat colonoscopy in five years based on her personal history of breast    cancer. Dictated by:   Everardo All Madilyn Fireman, M.D. Attending Physician:  Louie Bun DD:  09/15/01 TD:  09/16/01 Job: [id number]

## 2010-09-01 NOTE — Op Note (Signed)
[place]  Patient:    Sara [name], Sara [name]                       MRN: [id number] Proc. Date: 07/29/00 Adm. Date:  [id number] Disc. Date: [id number] Attending:  Janalyn Rouse                           Operative Report  PREOPERATIVE DIAGNOSIS:  Carcinoma of the left breast.  POSTOPERATIVE DIAGNOSIS:  Carcinoma of the left breast.  OPERATION PERFORMED:  Re-excision of the left breast biopsy site and axillary lymph node dissection.  SURGEON:  Rose Phi. Maple Hudson, M.D.  ANESTHESIA:  General.  INDICATIONS FOR PROCEDURE:  This patient had had a previous excision for what was thought to be ductal carcinoma in situ.  She was found to have invasive lobular carcinoma and with a questionable margin at the 9 oclock position of the left breast.  She is scheduled now for re-excision of the previous biopsy site and sentinel node biopsy and possible axillary lymph node dissection.  DESCRIPTION OF PROCEDURE:  After suitable general anesthesia was induced, the patient was placed in the supine position with the left breast draped.  The arm was extended on the arm board.  She had previously had technetium sulfur colloid injected in the periareolar area and we injected 5 cc of Lymphazurin blue and massaged the breast.  With her asleep and prepped and draped, a transverse left axillary incision was made with careful dissection through the subcutaneous tissues to the clavipectoral fascia.  We incised the clavipectoral fascia and continued to look for blue lymphatics and for hot node using the scanning of the Neoprobe. We were unable to identify either and after searching for some time for a sentinel node we went on and did a standard node dissection by incising along the pectoralis major muscle and retracting it and along the pectoralis minor and retracting it after dividing the clavipectoral fascia over the axillary vein.  We then swept out all the axillary contents  from below the vein and deep to the minor giving a level 1 and level 2 node dissection.  The long thoracic and thoracodorsal nerves were identified and preserved.  Intercostal brachialis nerve was divided.  Following the completion of this node dissection, we had good hemostasis.  A 10 mm Jackson-Pratt catheter was inserted and brought out through a separate stab wound.  The subcutaneous tissues were closed with 3-0 Vicryl on the skin with staples.  I then incised the old partial mastectomy site and re-excised marginal tissue extending from 10 to 9 and then down to 6 oclock.  We had good hemostasis and this incision was closed in a similar fashion.  Dressings were applied and the patient transferred to the recovery room in satisfactory condition having tolerated the procedure well. DD:  08/13/00 TD:  08/13/00 Job: [id number]

## 2010-09-21 ENCOUNTER — Encounter (INDEPENDENT_AMBULATORY_CARE_PROVIDER_SITE_OTHER): Payer: Self-pay | Admitting: General Surgery

## 2010-12-06 ENCOUNTER — Encounter (INDEPENDENT_AMBULATORY_CARE_PROVIDER_SITE_OTHER): Payer: Self-pay | Admitting: General Surgery

## 2010-12-14 ENCOUNTER — Other Ambulatory Visit: Payer: Self-pay | Admitting: Physical Medicine and Rehabilitation

## 2010-12-14 DIAGNOSIS — IMO0002 Reserved for concepts with insufficient information to code with codable children: Secondary | ICD-10-CM

## 2010-12-14 DIAGNOSIS — G894 Chronic pain syndrome: Secondary | ICD-10-CM

## 2010-12-14 DIAGNOSIS — M545 Low back pain: Secondary | ICD-10-CM

## 2010-12-14 DIAGNOSIS — M25569 Pain in unspecified knee: Secondary | ICD-10-CM

## 2010-12-14 DIAGNOSIS — M961 Postlaminectomy syndrome, not elsewhere classified: Secondary | ICD-10-CM

## 2010-12-14 DIAGNOSIS — M1710 Unilateral primary osteoarthritis, unspecified knee: Secondary | ICD-10-CM

## 2010-12-15 ENCOUNTER — Other Ambulatory Visit: Payer: Self-pay | Admitting: Physical Medicine and Rehabilitation

## 2010-12-22 ENCOUNTER — Ambulatory Visit
Admission: RE | Admit: 2010-12-22 | Discharge: 2010-12-22 | Disposition: A | Discharge status: Home / Self Care | Payer: Medicare Other | Source: Ambulatory Visit | Attending: Physical Medicine and Rehabilitation | Admitting: Physical Medicine and Rehabilitation

## 2010-12-22 DIAGNOSIS — M545 Low back pain: Secondary | ICD-10-CM

## 2010-12-22 DIAGNOSIS — M961 Postlaminectomy syndrome, not elsewhere classified: Secondary | ICD-10-CM

## 2010-12-22 DIAGNOSIS — G894 Chronic pain syndrome: Secondary | ICD-10-CM

## 2010-12-22 DIAGNOSIS — IMO0002 Reserved for concepts with insufficient information to code with codable children: Secondary | ICD-10-CM

## 2010-12-22 DIAGNOSIS — M1710 Unilateral primary osteoarthritis, unspecified knee: Secondary | ICD-10-CM

## 2010-12-22 DIAGNOSIS — M25569 Pain in unspecified knee: Secondary | ICD-10-CM

## 2010-12-22 MED ORDER — GADOBENATE DIMEGLUMINE 529 MG/ML IV SOLN
19.0000 mL | Freq: Once | INTRAVENOUS | Status: AC | PRN
  Administered 2010-12-22: 19 mL via INTRAVENOUS

## 2011-01-15 HISTORY — PX: FRACTURE SURGERY: SHX138

## 2011-01-28 ENCOUNTER — Emergency Department (HOSPITAL_COMMUNITY): Payer: Medicare Other

## 2011-01-28 ENCOUNTER — Emergency Department (HOSPITAL_COMMUNITY)
Admission: EM | Admit: 2011-01-28 | Discharge: 2011-01-29 | Disposition: A | Discharge status: Home / Self Care | Payer: Medicare Other | Attending: Emergency Medicine | Admitting: Emergency Medicine

## 2011-01-28 DIAGNOSIS — W010XXA Fall on same level from slipping, tripping and stumbling without subsequent striking against object, initial encounter: Secondary | ICD-10-CM | POA: Insufficient documentation

## 2011-01-28 DIAGNOSIS — Z79899 Other long term (current) drug therapy: Secondary | ICD-10-CM | POA: Insufficient documentation

## 2011-01-28 DIAGNOSIS — E7800 Pure hypercholesterolemia, unspecified: Secondary | ICD-10-CM | POA: Insufficient documentation

## 2011-01-28 DIAGNOSIS — E119 Type 2 diabetes mellitus without complications: Secondary | ICD-10-CM | POA: Insufficient documentation

## 2011-01-28 DIAGNOSIS — Z7982 Long term (current) use of aspirin: Secondary | ICD-10-CM | POA: Insufficient documentation

## 2011-01-28 DIAGNOSIS — Y998 Other external cause status: Secondary | ICD-10-CM | POA: Insufficient documentation

## 2011-01-28 DIAGNOSIS — I10 Essential (primary) hypertension: Secondary | ICD-10-CM | POA: Insufficient documentation

## 2011-01-28 DIAGNOSIS — Z853 Personal history of malignant neoplasm of breast: Secondary | ICD-10-CM | POA: Insufficient documentation

## 2011-01-28 DIAGNOSIS — S52539A Colles' fracture of unspecified radius, initial encounter for closed fracture: Secondary | ICD-10-CM | POA: Insufficient documentation

## 2011-01-28 DIAGNOSIS — Y93G9 Activity, other involving cooking and grilling: Secondary | ICD-10-CM | POA: Insufficient documentation

## 2011-01-28 LAB — DIFFERENTIAL
Basophils Relative: 1 % (ref 0–1)
Lymphs Abs: 1.6 10*3/uL (ref 0.7–4.0)
Monocytes Relative: 8 % (ref 3–12)
Neutro Abs: 5.9 10*3/uL (ref 1.7–7.7)
Neutrophils Relative %: 71 % (ref 43–77)

## 2011-01-28 LAB — POCT I-STAT TROPONIN I: Troponin i, poc: 0 ng/mL (ref 0.00–0.08)

## 2011-01-28 LAB — CBC
Hemoglobin: 13.2 g/dL (ref 12.0–15.0)
MCH: 28.9 pg (ref 26.0–34.0)
MCV: 88.2 fL (ref 78.0–100.0)
RBC: 4.56 MIL/uL (ref 3.87–5.11)

## 2011-01-28 LAB — COMPREHENSIVE METABOLIC PANEL
Albumin: 3.2 g/dL — ABNORMAL LOW (ref 3.5–5.2)
BUN: 15 mg/dL (ref 6–23)
Calcium: 9.7 mg/dL (ref 8.4–10.5)
Creatinine, Ser: 0.71 mg/dL (ref 0.50–1.10)
Total Protein: 7 g/dL (ref 6.0–8.3)

## 2011-02-01 NOTE — Consult Note (Signed)
  Sara [name], Sara [name]                ACCOUNT NO.:  [id number]  MEDICAL RECORD NO.:  [id number]  LOCATION:  [facility]                         FACILITY:  [facility]  PHYSICIAN:  Sara Pais. Mina [name], M.D.DATE OF BIRTH:  10/15/1925  DATE OF CONSULTATION:  01/28/2011 DATE OF DISCHARGE:  01/29/2011                                CONSULTATION   PHYSICIAN REQUESTING CONSULTATION:  Lear Ng, MD  REASON FOR CONSULTATION:  Sara [name] is an 77 year old right-hand- dominant female who fell at home, presents today with a displaced distal radius fracture on her left side.  She is 77 years old.  She has no known drug allergies, though apparently she is intolerant of narcotic pain medications.  She is a non-insulin dependent diabetic.  Other medications and medical history is listed on her chart.  PHYSICAL EXAMINATION:  GENERAL:  Reveals a well-nourished female, alert and oriented x3.  She is aware of her fall and of her fracture. NEUROSENSORY EXAM:  Normal.  She has an obvious deformity. SKIN:  Intact.  No open wounds.  X-rays show a displaced distal radius fracture with significant dorsal angulation and shortening.  The patient was given 2% plain lidocaine hematoma block, was placed in finger trap traction.  Closed reduction was performed.  She is placed in a well-padded sugar-tong splint.  She is given a sling and is discharged to home with follow up in my office on this Thursday.  I discussed with the family in great detail the nature of this injury and the fact that because she is intolerant of narcotic pain medication, she can take Advil every 8 hours with food and we would repeat x-rays in my office this Thursday to determine whether or not this is something we are going to treat either surgically or non surgically.  There are to call us immediately if there is any sign of compartment syndrome, which we discussed in great detail.  Again, discharged to home.  Follow up in my office on this  Thursday.     Sara [name], M.D.     MAW/MEDQ  D:  01/29/2011  T:  01/29/2011  Job:  [id number]  Electronically Signed by Dairl Ponder M.D. on 02/01/2011 02:07:21 PM

## 2011-02-05 ENCOUNTER — Encounter (INDEPENDENT_AMBULATORY_CARE_PROVIDER_SITE_OTHER): Payer: Self-pay | Admitting: General Surgery

## 2011-02-05 ENCOUNTER — Ambulatory Visit (INDEPENDENT_AMBULATORY_CARE_PROVIDER_SITE_OTHER): Payer: Medicare Other | Admitting: General Surgery

## 2011-02-05 VITALS — BP 138/72 | HR 76 | Temp 96.9°F | Resp 20 | Ht 61.5 in | Wt 200.4 lb

## 2011-02-05 DIAGNOSIS — Z853 Personal history of malignant neoplasm of breast: Secondary | ICD-10-CM

## 2011-02-05 NOTE — Patient Instructions (Signed)
Continue regular self exams May need an evaluation by your GI doc for abdominal pain - Dr. Ewing Schlein

## 2011-02-05 NOTE — Progress Notes (Signed)
Subjective:     Patient ID: Sara [name], female   DOB: 1925/08/11, 77 y.o.   MRN: [id number]  HPI The patient is a 77 year old white female who is now 10 years out from a left breast lobectomy and negative similar biopsy for a left breast cancer by Dr. Maple Hudson. She finished radiation and 5 years of Arimidex. She recently fell and fractured her left forearm. She had to have this plated. Her only other complaint is of some epigastric discomfort. This is been occurring off and on since her cholecystectomy about a year ago.  Review of Systems  Constitutional: Negative.   HENT: Negative.   Eyes: Negative.   Respiratory: Negative.   Cardiovascular: Negative.   Gastrointestinal: Positive for nausea and abdominal pain.  Genitourinary: Negative.   Musculoskeletal: Negative.   Skin: Negative.   Neurological: Negative.   Hematological: Negative.   Psychiatric/Behavioral: Negative.        Objective:   Physical Exam  Constitutional: She is oriented to person, place, and time. She appears well-developed and well-nourished.  HENT:  Head: Normocephalic and atraumatic.  Eyes: Conjunctivae and EOM are normal. Pupils are equal, round, and reactive to light.  Neck: Normal range of motion. Neck supple.  Cardiovascular: Normal rate, regular rhythm and normal heart sounds.   Pulmonary/Chest: Effort normal and breath sounds normal.       The left breast has some contraction of her scar with some thickness that is stable. She has some petechia of the skin. Otherwise the left breast is stable. No palpable mass in the right breast. No axillary supraclavicular or cervical lymphadenopathy  Abdominal: Soft. Bowel sounds are normal.       Occasional discomfort in the epigastric region. Nontender today.  Musculoskeletal: Normal range of motion.       Left arm is in a sling and a cast  Neurological: She is alert and oriented to person, place, and time.  Skin: Skin is warm and dry.  Psychiatric: She has a  normal mood and affect. Her behavior is normal.       Assessment:     10 years out from a left breast lumpectomy    Plan:     Her last mammogram was in April that was negative for any malignancy. We will plan to see her back on a yearly basis. If she needs a followup MRI study we will leave this up to Dr. Donnie Coffin to schedule

## 2011-02-23 DIAGNOSIS — I639 Cerebral infarction, unspecified: Secondary | ICD-10-CM

## 2011-02-23 HISTORY — DX: Cerebral infarction, unspecified: I63.9

## 2011-03-03 ENCOUNTER — Emergency Department (HOSPITAL_COMMUNITY): Payer: Medicare Other

## 2011-03-03 ENCOUNTER — Other Ambulatory Visit: Payer: Self-pay

## 2011-03-03 ENCOUNTER — Encounter (HOSPITAL_COMMUNITY): Payer: Self-pay | Admitting: *Deleted

## 2011-03-03 ENCOUNTER — Inpatient Hospital Stay (HOSPITAL_COMMUNITY)
Admission: EM | Admit: 2011-03-03 | Discharge: 2011-03-12 | DRG: 749 | Disposition: A | Discharge status: Home Health | Payer: Medicare Other | Attending: Internal Medicine | Admitting: Internal Medicine

## 2011-03-03 DIAGNOSIS — I10 Essential (primary) hypertension: Secondary | ICD-10-CM | POA: Insufficient documentation

## 2011-03-03 DIAGNOSIS — N764 Abscess of vulva: Principal | ICD-10-CM | POA: Diagnosis present

## 2011-03-03 DIAGNOSIS — N739 Female pelvic inflammatory disease, unspecified: Secondary | ICD-10-CM

## 2011-03-03 DIAGNOSIS — R319 Hematuria, unspecified: Secondary | ICD-10-CM | POA: Diagnosis present

## 2011-03-03 DIAGNOSIS — L0390 Cellulitis, unspecified: Secondary | ICD-10-CM | POA: Diagnosis present

## 2011-03-03 DIAGNOSIS — A4902 Methicillin resistant Staphylococcus aureus infection, unspecified site: Secondary | ICD-10-CM | POA: Diagnosis present

## 2011-03-03 DIAGNOSIS — I158 Other secondary hypertension: Secondary | ICD-10-CM | POA: Diagnosis present

## 2011-03-03 DIAGNOSIS — Z79899 Other long term (current) drug therapy: Secondary | ICD-10-CM

## 2011-03-03 DIAGNOSIS — M519 Unspecified thoracic, thoracolumbar and lumbosacral intervertebral disc disorder: Secondary | ICD-10-CM | POA: Insufficient documentation

## 2011-03-03 DIAGNOSIS — IMO0002 Reserved for concepts with insufficient information to code with codable children: Secondary | ICD-10-CM | POA: Diagnosis present

## 2011-03-03 DIAGNOSIS — Z923 Personal history of irradiation: Secondary | ICD-10-CM

## 2011-03-03 DIAGNOSIS — Z9221 Personal history of antineoplastic chemotherapy: Secondary | ICD-10-CM

## 2011-03-03 DIAGNOSIS — E669 Obesity, unspecified: Secondary | ICD-10-CM | POA: Diagnosis present

## 2011-03-03 DIAGNOSIS — F0390 Unspecified dementia without behavioral disturbance: Secondary | ICD-10-CM | POA: Diagnosis present

## 2011-03-03 DIAGNOSIS — H9190 Unspecified hearing loss, unspecified ear: Secondary | ICD-10-CM | POA: Diagnosis present

## 2011-03-03 DIAGNOSIS — R32 Unspecified urinary incontinence: Secondary | ICD-10-CM | POA: Diagnosis present

## 2011-03-03 DIAGNOSIS — E1169 Type 2 diabetes mellitus with other specified complication: Secondary | ICD-10-CM | POA: Diagnosis present

## 2011-03-03 DIAGNOSIS — E1159 Type 2 diabetes mellitus with other circulatory complications: Secondary | ICD-10-CM | POA: Insufficient documentation

## 2011-03-03 DIAGNOSIS — Z853 Personal history of malignant neoplasm of breast: Secondary | ICD-10-CM

## 2011-03-03 DIAGNOSIS — W19XXXA Unspecified fall, initial encounter: Secondary | ICD-10-CM | POA: Diagnosis present

## 2011-03-03 DIAGNOSIS — R451 Restlessness and agitation: Secondary | ICD-10-CM

## 2011-03-03 DIAGNOSIS — N390 Urinary tract infection, site not specified: Secondary | ICD-10-CM | POA: Diagnosis present

## 2011-03-03 DIAGNOSIS — L02419 Cutaneous abscess of limb, unspecified: Secondary | ICD-10-CM | POA: Diagnosis present

## 2011-03-03 DIAGNOSIS — L0291 Cutaneous abscess, unspecified: Secondary | ICD-10-CM | POA: Diagnosis present

## 2011-03-03 DIAGNOSIS — M1990 Unspecified osteoarthritis, unspecified site: Secondary | ICD-10-CM | POA: Diagnosis present

## 2011-03-03 DIAGNOSIS — R4182 Altered mental status, unspecified: Secondary | ICD-10-CM | POA: Diagnosis present

## 2011-03-03 DIAGNOSIS — Y92009 Unspecified place in unspecified non-institutional (private) residence as the place of occurrence of the external cause: Secondary | ICD-10-CM

## 2011-03-03 HISTORY — DX: Mental disorder, not otherwise specified: F99

## 2011-03-03 HISTORY — DX: Cerebral infarction, unspecified: I63.9

## 2011-03-03 LAB — DIFFERENTIAL
Basophils Absolute: 0 10*3/uL (ref 0.0–0.1)
Eosinophils Relative: 2 % (ref 0–5)
Lymphocytes Relative: 19 % (ref 12–46)
Lymphs Abs: 2.4 10*3/uL (ref 0.7–4.0)
Monocytes Absolute: 1.1 10*3/uL — ABNORMAL HIGH (ref 0.1–1.0)
Monocytes Relative: 9 % (ref 3–12)

## 2011-03-03 LAB — CBC
HCT: 39.9 % (ref 36.0–46.0)
MCV: 88.7 fL (ref 78.0–100.0)
RDW: 15.4 % (ref 11.5–15.5)
WBC: 12.5 10*3/uL — ABNORMAL HIGH (ref 4.0–10.5)

## 2011-03-03 LAB — URINALYSIS, ROUTINE W REFLEX MICROSCOPIC
Glucose, UA: 100 mg/dL — AB
Hgb urine dipstick: NEGATIVE
Protein, ur: NEGATIVE mg/dL

## 2011-03-03 LAB — URINE MICROSCOPIC-ADD ON

## 2011-03-03 LAB — BASIC METABOLIC PANEL
BUN: 18 mg/dL (ref 6–23)
CO2: 28 mEq/L (ref 19–32)
Calcium: 9.8 mg/dL (ref 8.4–10.5)
Creatinine, Ser: 0.65 mg/dL (ref 0.50–1.10)
Glucose, Bld: 116 mg/dL — ABNORMAL HIGH (ref 70–99)

## 2011-03-03 LAB — GLUCOSE, CAPILLARY: Glucose-Capillary: 174 mg/dL — ABNORMAL HIGH (ref 70–99)

## 2011-03-03 MED ORDER — FLUOXETINE HCL 10 MG PO CAPS
10.0000 mg | ORAL_CAPSULE | Freq: Every day | ORAL | Status: DC
  Administered 2011-03-03 – 2011-03-12 (×10): 10 mg via ORAL
  Filled 2011-03-03 (×10): qty 1

## 2011-03-03 MED ORDER — METFORMIN HCL 500 MG PO TABS
500.0000 mg | ORAL_TABLET | Freq: Two times a day (BID) | ORAL | Status: DC
  Administered 2011-03-04 – 2011-03-12 (×15): 500 mg via ORAL
  Filled 2011-03-03 (×18): qty 1

## 2011-03-03 MED ORDER — DILTIAZEM HCL ER COATED BEADS 120 MG PO CP24
120.0000 mg | ORAL_CAPSULE | Freq: Every day | ORAL | Status: DC
  Administered 2011-03-03 – 2011-03-12 (×10): 120 mg via ORAL
  Filled 2011-03-03 (×10): qty 1

## 2011-03-03 MED ORDER — THERA M PLUS PO TABS
1.0000 | ORAL_TABLET | Freq: Every day | ORAL | Status: DC
  Administered 2011-03-04: 1 via ORAL
  Administered 2011-03-05: 09:00:00 via ORAL
  Administered 2011-03-06 – 2011-03-08 (×3): 1 via ORAL
  Administered 2011-03-09: 10:00:00 via ORAL
  Administered 2011-03-10 – 2011-03-11 (×2): 1 via ORAL
  Administered 2011-03-12: 11:00:00 via ORAL
  Filled 2011-03-03 (×9): qty 1

## 2011-03-03 MED ORDER — SODIUM CHLORIDE 0.9 % IV BOLUS (SEPSIS)
1000.0000 mL | Freq: Once | INTRAVENOUS | Status: AC
  Administered 2011-03-03: 1000 mL via INTRAVENOUS

## 2011-03-03 MED ORDER — NIACIN 500 MG PO TABS
1000.0000 mg | ORAL_TABLET | Freq: Every day | ORAL | Status: DC
  Administered 2011-03-03 – 2011-03-05 (×3): 1000 mg via ORAL
  Administered 2011-03-06: 23:00:00 via ORAL
  Administered 2011-03-07 – 2011-03-10 (×4): 1000 mg via ORAL
  Filled 2011-03-03 (×11): qty 2

## 2011-03-03 MED ORDER — ASPIRIN 81 MG PO TABS: 81.0000 mg | ORAL_TABLET | Freq: Every day | ORAL | Status: DC

## 2011-03-03 MED ORDER — SENNA 8.6 MG PO TABS: 2.0000 | ORAL_TABLET | Freq: Every day | ORAL | Status: DC | PRN

## 2011-03-03 MED ORDER — GLIPIZIDE ER 5 MG PO TB24
5.0000 mg | ORAL_TABLET | Freq: Every day | ORAL | Status: DC
  Administered 2011-03-04 – 2011-03-12 (×9): 5 mg via ORAL
  Filled 2011-03-03 (×9): qty 1

## 2011-03-03 MED ORDER — VANCOMYCIN HCL IN DEXTROSE 1-5 GM/200ML-% IV SOLN
1000.0000 mg | Freq: Two times a day (BID) | INTRAVENOUS | Status: DC
  Administered 2011-03-03 – 2011-03-06 (×6): 1000 mg via INTRAVENOUS
  Filled 2011-03-03 (×8): qty 200

## 2011-03-03 MED ORDER — ACETAMINOPHEN 650 MG RE SUPP: 650.0000 mg | Freq: Four times a day (QID) | RECTAL | Status: DC | PRN

## 2011-03-03 MED ORDER — SODIUM CHLORIDE 0.9 % IV SOLN
INTRAVENOUS | Status: DC
  Administered 2011-03-04: 1000 mL via INTRAVENOUS
  Administered 2011-03-04 – 2011-03-06 (×4): via INTRAVENOUS
  Administered 2011-03-07: 1000 mL via INTRAVENOUS
  Administered 2011-03-08 – 2011-03-11 (×5): via INTRAVENOUS

## 2011-03-03 MED ORDER — ALUM & MAG HYDROXIDE-SIMETH 200-200-20 MG/5ML PO SUSP: 30.0000 mL | Freq: Four times a day (QID) | ORAL | Status: DC | PRN

## 2011-03-03 MED ORDER — HYDROMORPHONE HCL PF 2 MG/ML IJ SOLN
INTRAMUSCULAR | Status: AC
  Filled 2011-03-03: qty 1

## 2011-03-03 MED ORDER — ONDANSETRON HCL 4 MG/2ML IJ SOLN
4.0000 mg | Freq: Four times a day (QID) | INTRAMUSCULAR | Status: DC | PRN
  Administered 2011-03-05 – 2011-03-08 (×2): 4 mg via INTRAVENOUS
  Filled 2011-03-03 (×2): qty 2

## 2011-03-03 MED ORDER — ACETAMINOPHEN 325 MG PO TABS
650.0000 mg | ORAL_TABLET | Freq: Four times a day (QID) | ORAL | Status: DC | PRN
  Filled 2011-03-03: qty 2

## 2011-03-03 MED ORDER — VITAMIN-B COMPLEX PO TABS: 1.0000 | ORAL_TABLET | ORAL | Status: DC

## 2011-03-03 MED ORDER — B COMPLEX-C PO TABS
1.0000 | ORAL_TABLET | Freq: Every day | ORAL | Status: DC
  Administered 2011-03-03 – 2011-03-12 (×10): 1 via ORAL
  Filled 2011-03-03 (×10): qty 1

## 2011-03-03 MED ORDER — NAPROXEN 250 MG PO TABS
250.0000 mg | ORAL_TABLET | Freq: Two times a day (BID) | ORAL | Status: DC
  Administered 2011-03-04 – 2011-03-12 (×14): 250 mg via ORAL
  Filled 2011-03-03 (×18): qty 1

## 2011-03-03 MED ORDER — ENOXAPARIN SODIUM 40 MG/0.4ML ~~LOC~~ SOLN
40.0000 mg | SUBCUTANEOUS | Status: DC
  Administered 2011-03-03 – 2011-03-10 (×8): 40 mg via SUBCUTANEOUS
  Filled 2011-03-03 (×10): qty 0.4

## 2011-03-03 MED ORDER — POTASSIUM CHLORIDE CRYS ER 20 MEQ PO TBCR
20.0000 meq | EXTENDED_RELEASE_TABLET | Freq: Every day | ORAL | Status: DC
  Administered 2011-03-03 – 2011-03-12 (×9): 20 meq via ORAL
  Filled 2011-03-03 (×10): qty 1

## 2011-03-03 MED ORDER — ACETAMINOPHEN 325 MG PO TABS: 650.0000 mg | ORAL_TABLET | Freq: Four times a day (QID) | ORAL | Status: DC | PRN

## 2011-03-03 MED ORDER — CALCIUM CARBONATE 200 MG PO CAPS: 200.0000 mg | ORAL_CAPSULE | Freq: Every day | ORAL | Status: DC

## 2011-03-03 MED ORDER — DARIFENACIN HYDROBROMIDE ER 7.5 MG PO TB24
7.5000 mg | ORAL_TABLET | Freq: Every day | ORAL | Status: DC
  Administered 2011-03-03 – 2011-03-12 (×10): 7.5 mg via ORAL
  Filled 2011-03-03 (×10): qty 1

## 2011-03-03 MED ORDER — INSULIN ASPART 100 UNIT/ML ~~LOC~~ SOLN: 0.0000 [IU] | Freq: Every day | SUBCUTANEOUS | Status: DC

## 2011-03-03 MED ORDER — CALCIUM CARBONATE ANTACID 500 MG PO CHEW
1.0000 | CHEWABLE_TABLET | Freq: Every day | ORAL | Status: DC
  Administered 2011-03-03 – 2011-03-12 (×10): 200 mg via ORAL
  Filled 2011-03-03 (×10): qty 1

## 2011-03-03 MED ORDER — INSULIN ASPART 100 UNIT/ML ~~LOC~~ SOLN
0.0000 [IU] | Freq: Three times a day (TID) | SUBCUTANEOUS | Status: DC
  Administered 2011-03-04 – 2011-03-05 (×2): 1 [IU] via SUBCUTANEOUS
  Administered 2011-03-05 – 2011-03-06 (×2): 2 [IU] via SUBCUTANEOUS
  Administered 2011-03-07 (×2): 1 [IU] via SUBCUTANEOUS
  Administered 2011-03-07 – 2011-03-08 (×2): 2 [IU] via SUBCUTANEOUS
  Administered 2011-03-08 (×2): 1 [IU] via SUBCUTANEOUS
  Administered 2011-03-12: 2 [IU] via SUBCUTANEOUS
  Filled 2011-03-03: qty 3

## 2011-03-03 MED ORDER — HYDROMORPHONE HCL PF 1 MG/ML IJ SOLN: 0.5000 mg | Freq: Once | INTRAMUSCULAR | Status: DC

## 2011-03-03 MED ORDER — HYDROMORPHONE HCL PF 1 MG/ML IJ SOLN: 0.5000 mg | INTRAMUSCULAR | Status: DC | PRN

## 2011-03-03 MED ORDER — ACETAMINOPHEN 325 MG PO TABS
650.0000 mg | ORAL_TABLET | Freq: Once | ORAL | Status: AC
  Administered 2011-03-03: 650 mg via ORAL
  Filled 2011-03-03: qty 2

## 2011-03-03 MED ORDER — PROPRANOLOL HCL 60 MG PO TABS
60.0000 mg | ORAL_TABLET | Freq: Every day | ORAL | Status: DC
  Administered 2011-03-03 – 2011-03-12 (×10): 60 mg via ORAL
  Filled 2011-03-03 (×11): qty 1

## 2011-03-03 MED ORDER — OMEGA-3 FISH OIL 1000 MG PO CAPS: 1.0000 | ORAL_CAPSULE | Freq: Two times a day (BID) | ORAL | Status: DC

## 2011-03-03 MED ORDER — ONDANSETRON HCL 4 MG PO TABS: 4.0000 mg | ORAL_TABLET | Freq: Four times a day (QID) | ORAL | Status: DC | PRN

## 2011-03-03 MED ORDER — MULTIVITAMINS PO CAPS: 1.0000 | ORAL_CAPSULE | Freq: Every day | ORAL | Status: DC

## 2011-03-03 MED ORDER — NAPROXEN SODIUM 220 MG PO TABS: 220.0000 mg | ORAL_TABLET | Freq: Two times a day (BID) | ORAL | Status: DC

## 2011-03-03 MED ORDER — DOCUSATE SODIUM 100 MG PO CAPS
100.0000 mg | ORAL_CAPSULE | Freq: Two times a day (BID) | ORAL | Status: DC
  Administered 2011-03-03 – 2011-03-12 (×17): 100 mg via ORAL
  Filled 2011-03-03 (×19): qty 1

## 2011-03-03 MED ORDER — CIPROFLOXACIN IN D5W 400 MG/200ML IV SOLN
400.0000 mg | Freq: Two times a day (BID) | INTRAVENOUS | Status: DC
  Administered 2011-03-04 – 2011-03-06 (×6): 400 mg via INTRAVENOUS
  Filled 2011-03-03 (×9): qty 200

## 2011-03-03 MED ORDER — OMEGA-3-ACID ETHYL ESTERS 1 G PO CAPS
1.0000 g | ORAL_CAPSULE | Freq: Two times a day (BID) | ORAL | Status: DC
  Administered 2011-03-03 – 2011-03-12 (×17): 1 g via ORAL
  Filled 2011-03-03 (×19): qty 1

## 2011-03-03 MED ORDER — ASPIRIN EC 81 MG PO TBEC
81.0000 mg | DELAYED_RELEASE_TABLET | Freq: Every day | ORAL | Status: DC
  Administered 2011-03-04 – 2011-03-11 (×8): 81 mg via ORAL
  Filled 2011-03-03 (×9): qty 1

## 2011-03-03 MED ORDER — HYDROCODONE-ACETAMINOPHEN 5-325 MG PO TABS: 1.0000 | ORAL_TABLET | ORAL | Status: DC | PRN

## 2011-03-03 MED ORDER — CLINDAMYCIN PHOSPHATE 900 MG/50ML IV SOLN
900.0000 mg | INTRAVENOUS | Status: AC
  Administered 2011-03-03: 900 mg via INTRAVENOUS
  Filled 2011-03-03: qty 50

## 2011-03-03 NOTE — ED Notes (Signed)
HYDROMORPHONE was NOT given---After pt.. Was scanned---and just before med was pushed--daughter came into room and stated opiates make her crazy.  Hospitalist cancelled order and ordered Tylenol as pt. Stated she only had pain when she moved her leg.

## 2011-03-03 NOTE — ED Notes (Signed)
Hospitalist here and in to assess pt---Culture obtained of right labial wound--Pt. Tolerated well.

## 2011-03-03 NOTE — H&P (Signed)
Sara [name] is an 77 y.o. female.   Chief Complaint: Falls HPI: [name] is an 77 year old Caucasian female who had been very shaky and weak the last 3 days today she had 2 falls she came into the emergency room. She had noticed skin lesions which have been getting worse over the last 3 days and she attributed her weakness and shakiness to that. She has a past medical history of left breast cancer status post lumpectomy approximately 10 years ago and has been cancer free since and she received radiation and chemotherapy following her lumpectomy. Her last cancer screening visit was this past year and her mammogram was negative for any cancer and she is just following up on a yearly basis. Patient lives alone but has supportive family. She has medical she has diabetes hypertension arthritis back problems and had received steroid injections of her back in the last few months. She had spoken with her doctor regarding the steroid injections and she was told that they were not from the labs that had been found to have contaminants. She received antibiotics in the emergency room but blood cultures were obtained prior to receiving the antibiotics and wound culture and urine culture obtained post receiving antiantibiotic. her urinalysis in the emergency room showed many bacteria and white cells so a culture was obtained.  Past Medical History  Diagnosis Date  . Cancer     breast - left  . Arthritis   . Diabetes mellitus   . Hypertension   . Generalized headaches   . Hearing loss   . Sore throat   . Confusion   . Weakness     Past Surgical History  Procedure Date  . Back surgery     lower  . Vericose veins   . Abdominal hysterectomy   . Breast surgery   . Wrist surgery     No family history on file. Social History:  reports that she has never smoked. She has never used smokeless tobacco. She reports that she does not drink alcohol or use illicit drugs.  Allergies:  Allergies    Allergen Reactions  . Codeine Nausea Only and Other (See Comments)    In addition, most pain medications cause confusion, make patient light headed.    Medications Prior to Admission  Medication Dose Route Frequency Provider Last Rate Last Dose  . acetaminophen (TYLENOL) tablet 650 mg  650 mg Oral Once Arne Cleveland, MD      . clindamycin (CLEOCIN) IVPB 900 mg  900 mg Intravenous To ER Gerhard Munch, MD   900 mg at 03/03/11 1721  . HYDROmorphone (DILAUDID) 2 MG/ML injection           . HYDROmorphone (DILAUDID) injection 0.5 mg  0.5 mg Intravenous Once Gerhard Munch, MD      . sodium chloride 0.9 % bolus 1,000 mL  1,000 mL Intravenous Once Gerhard Munch, MD   1,000 mL at 03/03/11 1720   Medications Prior to Admission  Medication Sig Dispense Refill  . acetaminophen (TYLENOL) 325 MG tablet Take 650 mg by mouth every 4 (four) hours as needed. pain      . aspirin 81 MG tablet Take 81 mg by mouth daily.        . B Complex Vitamins (VITAMIN-B COMPLEX PO) Take by mouth daily.        . calcium carbonate 200 MG capsule Take 250 mg by mouth daily.        Marland Kitchen diltiazem (CARDIZEM CD) 120  MG 24 hr capsule Take 120 mg by mouth daily.       Marland Kitchen FLUoxetine (PROZAC) 10 MG capsule Take 10 mg by mouth daily.       Marland Kitchen KLOR-CON M20 20 MEQ tablet Take 20 mEq by mouth daily.       . metFORMIN (GLUCOPHAGE) 500 MG tablet Take 500 mg by mouth 2 (two) times daily with a meal.       . Multiple Vitamin (MULTIVITAMIN) capsule Take 1 capsule by mouth daily.        . Naproxen Sodium (ALEVE PO) Take 1 tablet by mouth daily as needed. Pain.      . niacin 500 MG tablet Take 1,000 mg by mouth at bedtime.       . Omega-3 Fatty Acids (OMEGA-3 FISH OIL PO) Take 1 capsule by mouth daily.       . propranolol (INDERAL) 60 MG tablet Take 60 mg by mouth daily.       . VESICARE 5 MG tablet Take 5 mg by mouth daily.         Results for orders placed during the hospital encounter of 03/03/11 (from the past 48 hour(s))  CBC      Status: Abnormal   Collection Time   03/03/11  2:35 PM      Component Value Range Comment   WBC 12.5 (*) 4.0 - 10.5 (K/uL)    RBC 4.50  3.87 - 5.11 (MIL/uL)    Hemoglobin 13.3  12.0 - 15.0 (g/dL)    HCT 16.1  09.6 - 04.5 (%)    MCV 88.7  78.0 - 100.0 (fL)    MCH 29.6  26.0 - 34.0 (pg)    MCHC 33.3  30.0 - 36.0 (g/dL)    RDW 40.9  81.1 - 91.4 (%)    Platelets 171  150 - 400 (K/uL)   DIFFERENTIAL     Status: Abnormal   Collection Time   03/03/11  2:35 PM      Component Value Range Comment   Neutrophils Relative 71  43 - 77 (%)    Neutro Abs 8.8 (*) 1.7 - 7.7 (K/uL)    Lymphocytes Relative 19  12 - 46 (%)    Lymphs Abs 2.4  0.7 - 4.0 (K/uL)    Monocytes Relative 9  3 - 12 (%)    Monocytes Absolute 1.1 (*) 0.1 - 1.0 (K/uL)    Eosinophils Relative 2  0 - 5 (%)    Eosinophils Absolute 0.2  0.0 - 0.7 (K/uL)    Basophils Relative 0  0 - 1 (%)    Basophils Absolute 0.0  0.0 - 0.1 (K/uL)   BASIC METABOLIC PANEL     Status: Abnormal   Collection Time   03/03/11  2:35 PM      Component Value Range Comment   Sodium 140  135 - 145 (mEq/L)    Potassium 3.9  3.5 - 5.1 (mEq/L)    Chloride 105  96 - 112 (mEq/L)    CO2 28  19 - 32 (mEq/L)    Glucose, Bld 116 (*) 70 - 99 (mg/dL)    BUN 18  6 - 23 (mg/dL)    Creatinine, Ser 7.82  0.50 - 1.10 (mg/dL)    Calcium 9.8  8.4 - 10.5 (mg/dL)    GFR calc non Af Amer 80 (*) >90 (mL/min)    GFR calc Af Amer >90  >90 (mL/min)   GLUCOSE, CAPILLARY     Status:  Abnormal   Collection Time   03/03/11  4:04 PM      Component Value Range Comment   Glucose-Capillary 111 (*) 70 - 99 (mg/dL)    Comment 1 Documented in Chart      Comment 2 Notify RN     URINALYSIS, ROUTINE W REFLEX MICROSCOPIC     Status: Abnormal   Collection Time   03/03/11  4:19 PM      Component Value Range Comment   Color, Urine AMBER (*) YELLOW  BIOCHEMICALS MAY BE AFFECTED BY COLOR   Appearance CLOUDY (*) CLEAR     Specific Gravity, Urine 1.028  1.005 - 1.030     pH 5.0  5.0 - 8.0      Glucose, UA 100 (*) NEGATIVE (mg/dL)    Hgb urine dipstick NEGATIVE  NEGATIVE     Bilirubin Urine SMALL (*) NEGATIVE     Ketones, ur TRACE (*) NEGATIVE (mg/dL)    Protein, ur NEGATIVE  NEGATIVE (mg/dL)    Urobilinogen, UA 0.2  0.0 - 1.0 (mg/dL)    Nitrite NEGATIVE  NEGATIVE     Leukocytes, UA SMALL (*) NEGATIVE    URINE MICROSCOPIC-ADD ON     Status: Abnormal   Collection Time   03/03/11  4:19 PM      Component Value Range Comment   Squamous Epithelial / LPF RARE  RARE     WBC, UA 7-10  <3 (WBC/hpf)    Bacteria, UA MANY (*) RARE     Crystals CA OXALATE CRYSTALS (*) NEGATIVE     Dg Knee Ap/lat W/sunrise Left  03/03/2011  *RADIOLOGY REPORT*  Clinical Data: Fall, knee swelling  DG KNEE - 3 VIEWS  Comparison: 12/22/2010  Findings: Moderate to severe tricompartmental degenerative change again identified.  Trace suprapatellar fluid noted.  The patella tracks appropriately in the patellofemoral groove.  No fracture or dislocation.  Vascular calcification noted.  IMPRESSION: Moderate to severe tricompartmental degenerative change.  No acute finding.  Original Report Authenticated By: Harrel Lemon, M.D.    Review of Systems  Constitutional: Positive for chills and malaise/fatigue. Negative for fever.  HENT: Positive for hearing loss. Negative for congestion and sore throat.   Eyes: Negative.   Respiratory: Negative for cough, hemoptysis, sputum production, shortness of breath and wheezing.   Cardiovascular: Negative for chest pain, palpitations, orthopnea, leg swelling and PND.  Gastrointestinal: Negative for heartburn, nausea, vomiting, abdominal pain, diarrhea, constipation, blood in stool and melena.  Genitourinary: Positive for frequency and hematuria. Negative for dysuria, urgency and flank pain.  Musculoskeletal: Positive for back pain and joint pain.  Skin: Positive for rash.  Neurological: Positive for weakness and headaches. Negative for dizziness, seizures and loss of  consciousness.  Endo/Heme/Allergies: Negative.   Psychiatric/Behavioral: Positive for memory loss. Negative for depression, suicidal ideas, hallucinations and substance abuse. The patient is not nervous/anxious and does not have insomnia.     Blood pressure 134/70, pulse 108, temperature 98.1 F (36.7 C), temperature source Oral, resp. rate 18, SpO2 95.00%. Physical Exam  Constitutional: She appears well-developed and well-nourished. She appears distressed.  HENT:  Head: Normocephalic and atraumatic.  Right Ear: External ear normal.  Left Ear: External ear normal.  Nose: Nose normal.  Mouth/Throat: Oropharynx is clear and moist. No oropharyngeal exudate.  Eyes: Conjunctivae and EOM are normal. Pupils are equal, round, and reactive to light. Right eye exhibits no discharge. Left eye exhibits no discharge. No scleral icterus.  Neck: Normal range of motion. Neck supple. No  JVD present. No tracheal deviation present. No thyromegaly present.  Cardiovascular: Normal rate and normal heart sounds.  Exam reveals no friction rub.   No murmur heard. Respiratory: Effort normal and breath sounds normal. No respiratory distress. She has no wheezes. She has no rales. She exhibits no tenderness.  GI: Soft. Bowel sounds are normal. She exhibits no distension. There is no tenderness. There is no rebound.  Genitourinary:     Lymphadenopathy:    She has no cervical adenopathy.  Skin:        Assessment/Plan Generalize weakness and falls secondary to cellulitis from her multiple abscess. Abscess on her left posterior proximal forearm left mid proximal thigh right inferior perineum and spreading cellulitis.  Diabetes type 2 Mild Senile Dementia Hypertension UTI  Plan  Admit to [facility] team 4 IV antibiotics accu check AC &HS IV fluids Will treat Vancomycin & Cipro IV Am labs  Hospital sliding scale.       Nicoletta Hush 03/03/2011, 7:47 PM

## 2011-03-03 NOTE — ED Provider Notes (Signed)
History     CSN: [id number] Arrival date & time: 03/03/2011  2:54 PM   First MD Initiated Contact with Patient 03/03/11 1530      Chief Complaint  Patient presents with  . Fall  . Abscess    thigh, arm    (Consider location/radiation/quality/duration/timing/severity/associated sxs/prior treatment) HPI The patient presents following 2 falls today. She notes that aside from the recent development of multiple skin lesions, she has generally been in her usual state of health, which includes chronic back pain. Her 2 fossae were 12 hours, and 9 hours prior to presentation. Each of the falls occurred while the patient felt unbalanced. I. therefore, nor after either of the episodes the patient had any chest pain, dyspnea, headache, confusion, abdominal pain, nausea, fever, chills. She notes that each the falls occur from sitting height, one from a walker, the other from the edge of the bed. She denies any significant trauma to any joints, head, hips.  He'll exception is minor convex to the left knee on the second fall. The patient's sepsis he developed insidiously 3 days ago. Since onset she has developed 3 particular areas of discomfort. Areas are on her left proximal forearm, her left posterior mid thigh, and her right labial fold. She has a history of one prior abscess.  The pain of the abscess use is described as sharp, burning. It is otherwise nonradiating. No relief with OTC medications. No current fever, chills, nausea, disorientation. Past Medical History  Diagnosis Date  . Cancer     breast - left  . Arthritis   . Diabetes mellitus   . Hypertension   . Generalized headaches   . Hearing loss   . Sore throat   . Confusion   . Weakness     Past Surgical History  Procedure Date  . Back surgery     lower  . Vericose veins   . Abdominal hysterectomy   . Breast surgery   . Wrist surgery     No family history on file.  History  Substance Use Topics  . Smoking status: Never  Smoker   . Smokeless tobacco: Never Used  . Alcohol Use: No    OB History    Grav Para Term Preterm Abortions TAB SAB Ect Mult Living                  Review of Systems Gen: Per HPI HEENT: No HA CV: No CP Resp: No dyspnea Abd: Per HPI, otherwise negative Musk: Per HPI, otherwise negative Neuro: No dysesthesia, or focal changes GU: Per HPI, otherwise negative Skin: hpi Psych: Neg  Allergies  Codeine  Home Medications   Current Outpatient Rx  Name Route Sig Dispense Refill  . ACETAMINOPHEN 325 MG PO TABS Oral Take 650 mg by mouth every 4 (four) hours as needed.      Marland Kitchen ALUM & MAG HYDROXIDE-SIMETH 200-200-20 MG/5ML PO SUSP Oral Take 5 mLs by mouth every 2 (two) hours as needed.      . ASPIRIN 81 MG PO TABS Oral Take 81 mg by mouth daily.      Marland Kitchen VITAMIN-B COMPLEX PO Oral Take by mouth daily.      . B COMPLEX-VITAMIN B12 PO Oral Take by mouth.      Marland Kitchen CALCIUM CARBONATE 200 MG PO CAPS Oral Take 250 mg by mouth daily.      Marland Kitchen CALCIUM 600 + D PO Oral Take by mouth.      . ALLERGY RELIEF PO  Oral Take by mouth.      Marland Kitchen VITAMIN D PO Oral Take by mouth daily.      Marland Kitchen VITAMIN B-12 CR PO Oral Take by mouth daily.      . CYCLOBENZAPRINE HCL 10 MG PO TABS Oral Take 10 mg by mouth 3 (three) times daily as needed.      Marland Kitchen DILTIAZEM HCL COATED BEADS 120 MG PO CP24      . FLUOXETINE HCL 10 MG PO CAPS      . FUROSEMIDE PO Oral Take by mouth.      Marland Kitchen GLIPIZIDE PO Oral Take by mouth.      Marland Kitchen KLOR-CON M20 20 MEQ PO TBCR      . LORATADINE 10 MG PO TABS Oral Take 10 mg by mouth daily.      . MELOXICAM 7.5 MG PO TABS Oral Take 7.5 mg by mouth daily.      Marland Kitchen METFORMIN HCL 500 MG PO TABS      . MULTIVITAMINS PO CAPS Oral Take 1 capsule by mouth daily.      . ALEVE PO Oral Take by mouth.      Marland Kitchen NIACIN 500 MG PO TABS Oral Take 500 mg by mouth daily with breakfast.      . NIACIN-SIMVASTATIN 500-20 MG PO TB24 Oral Take 1 tablet by mouth at bedtime.      . OMEGA-3 FISH OIL PO Oral Take by mouth.      Marland Kitchen  PROPRANOLOL HCL 60 MG PO TABS Oral Take 60 mg by mouth 3 (three) times daily.      Marland Kitchen MICARDIS HCT PO Oral Take by mouth.      . VESICARE 5 MG PO TABS        BP 124/55  Pulse 61  Temp(Src) 98.1 F (36.7 C) (Oral)  Resp 18  SpO2 98%  Physical Exam  Constitutional: She is oriented to person, place, and time. She appears well-developed and well-nourished. No distress.  HENT:  Head: Normocephalic and atraumatic.  Eyes: Conjunctivae are normal. Pupils are equal, round, and reactive to light.  Neck: Neck supple.  Cardiovascular: Normal rate and regular rhythm.   Murmur heard. Pulmonary/Chest: Effort normal and breath sounds normal.  Abdominal: Soft. She exhibits no distension. There is no tenderness. There is no rebound and no guarding.  Genitourinary:       On the right lateral inferior labial crease, there is an actively draining, punctate lesions with surrounding erythema, approximately 4 cm in diameter.  Musculoskeletal:       On the left proximal posterior forearm there is a 5 cm circular erythematous area with a focal excoriated lesion, approximately half centimeter in diameter. No active drainage. There is mild induration focally in the Center. On the left posterior proximal mid thigh there is a 12 cm diameter erythematous area with central patches induration, approximately 6 cm. No fluctuance, or active drainage.  Neurological: She is alert and oriented to person, place, and time. No cranial nerve deficit. Coordination normal.  Skin: Skin is warm and dry.  Psychiatric: She has a normal mood and affect.    ED Course  Procedures (including critical care time)   Labs Reviewed  CBC  DIFFERENTIAL  BASIC METABOLIC PANEL  URINALYSIS, ROUTINE W REFLEX MICROSCOPIC   No results found.   No diagnosis found.   Date: 03/04/2011  Rate: 78  Rhythm: normal sinus rhythm  QRS Axis: normal  Intervals: normal  ST/T Wave abnormalities: nonspecific T wave changes  Conduction  Disutrbances:right bundle branch block and left anterior fascicular block  Narrative Interpretation:   Old EKG Reviewed: unchanged   Elderly F s/p fall w mult abscesses.  R/O occult infection vs. Systemic effects of mult skin lesions. MDM  This 77 year old female presents with new skin lesions. Notably the patient was in her usual state of health prior to development of these lesions. On exam she is in no distress but has multiple erythematous, indurated lesions, notably near her perineum. The patient is in not in distress, the rapidity of progression of the lesions is concerning for cellulitis. The patient received clindamycin, analgesics, and will be admitted for further evaluation and management.        Gerhard Munch, MD 03/04/11 [phone]

## 2011-03-03 NOTE — Progress Notes (Signed)
ANTIBIOTIC CONSULT NOTE - INITIAL  Pharmacy Consult for Vancomycin Indication: cellulitis/abscess  Allergies  Allergen Reactions  . Codeine Nausea Only and Other (See Comments)    In addition, most pain medications cause confusion, make patient light headed.    Patient Measurements:  per 02/05/11 measurements - 90.9 kg   Vital Signs: Temp: 98.5 F (36.9 C) (11/17 2100) Temp src: Oral (11/17 2100) BP: 129/62 mmHg (11/17 2100) Pulse Rate: 77  (11/17 2100) Intake/Output from previous day:   Intake/Output from this shift:    Labs:  Basename 03/03/11 1435  WBC 12.5*  HGB 13.3  PLT 171  LABCREA --  CREATININE 0.65   Normalized CrCl = 61.6 ml/min/1.74m2 The CrCl is unknown because both a height and weight (above a minimum accepted value) are required for this calculation. No results found for this basename: VANCOTROUGH:2,VANCOPEAK:2,VANCORANDOM:2,GENTTROUGH:2,GENTPEAK:2,GENTRANDOM:2,TOBRATROUGH:2,TOBRAPEAK:2,TOBRARND:2,AMIKACINPEAK:2,AMIKACINTROU:2,AMIKACIN:2, in the last 72 hours   Microbiology: No results found for this or any previous visit (from the past 720 hour(s)).  Medical History: Past Medical History  Diagnosis Date  . Cancer     breast - left  . Arthritis   . Diabetes mellitus   . Hypertension   . Generalized headaches   . Hearing loss   . Sore throat   . Confusion   . Weakness     Medications:  Scheduled:    . acetaminophen  650 mg Oral Once  . aspirin  81 mg Oral Daily  . calcium carbonate  200 mg Oral Daily  . ciprofloxacin  400 mg Intravenous Q12H  . clindamycin (CLEOCIN) IV  900 mg Intravenous To ER  . darifenacin  7.5 mg Oral Daily  . diltiazem  120 mg Oral Daily  . docusate sodium  100 mg Oral BID  . enoxaparin  40 mg Subcutaneous Q24H  . FLUoxetine  10 mg Oral Daily  . glipiZIDE  5 mg Oral Daily  . HYDROmorphone      .  HYDROmorphone (DILAUDID) injection  0.5 mg Intravenous Once  . insulin aspart  0-5 Units Subcutaneous QHS  .  insulin aspart  0-9 Units Subcutaneous TID WC  . metFORMIN  500 mg Oral BID WC  . multivitamin  1 capsule Oral Daily  . naproxen sodium  220 mg Oral BID WC  . niacin  1,000 mg Oral QHS  . omega-3 fish oil  1 capsule Oral BID  . potassium chloride SA  20 mEq Oral Daily  . propranolol  60 mg Oral Daily  . sodium chloride  1,000 mL Intravenous Once  . vancomycin  1,000 mg Intravenous Q12H  . Vitamin-B Complex  1 tablet Oral 1 day or 1 dose   Assessment: 77 yo admitted with spreading cellulitis and multiple abscesses  Goal of Therapy:  Vancomycin trough level 10-15 mcg/ml  Plan:  Follow up culture results Start Vancomycin 1g IV q12.  Will check a trough prn once reaches steady state  Berkley Harvey 03/03/2011,9:14 PM

## 2011-03-03 NOTE — ED Notes (Signed)
Pt fell twice this morning, no LOC, went to [facility] sent here for evaluation, also has wounds on arm and thigh

## 2011-03-04 ENCOUNTER — Encounter (HOSPITAL_COMMUNITY): Payer: Self-pay | Admitting: *Deleted

## 2011-03-04 LAB — MRSA PCR SCREENING: MRSA by PCR: POSITIVE — AB

## 2011-03-04 LAB — COMPREHENSIVE METABOLIC PANEL
ALT: 25 U/L (ref 0–35)
AST: 18 U/L (ref 0–37)
Albumin: 2.6 g/dL — ABNORMAL LOW (ref 3.5–5.2)
Alkaline Phosphatase: 72 U/L (ref 39–117)
GFR calc Af Amer: >90 mL/min (ref >90)
Glucose, Bld: 109 mg/dL — ABNORMAL HIGH (ref 70–99)
Potassium: 3.8 mEq/L (ref 3.5–5.1)
Sodium: 139 mEq/L (ref 135–145)
Total Protein: 5.5 g/dL — ABNORMAL LOW (ref 6.0–8.3)

## 2011-03-04 LAB — GLUCOSE, CAPILLARY
Glucose-Capillary: 111 mg/dL — ABNORMAL HIGH (ref 70–99)
Glucose-Capillary: 143 mg/dL — ABNORMAL HIGH (ref 70–99)
Glucose-Capillary: 62 mg/dL — ABNORMAL LOW (ref 70–99)
Glucose-Capillary: 138 mg/dL — ABNORMAL HIGH (ref 70–99)

## 2011-03-04 MED ORDER — CHLORHEXIDINE GLUCONATE 4 % EX LIQD
Freq: Every day | CUTANEOUS | Status: DC
  Administered 2011-03-05 – 2011-03-12 (×6): via TOPICAL
  Filled 2011-03-04 (×8): qty 15

## 2011-03-04 NOTE — Plan of Care (Signed)
Problem: Consults Goal: Skin Care Protocol Initiated - if indicated If consults are not indicated, leave blank or document N/A Outcome: Completed/Met Date Met:  03/04/11 With incontinence, used peri products.

## 2011-03-04 NOTE — Plan of Care (Signed)
Problem: Phase I Progression Outcomes Goal: Wound assessment- dressing change as appropriate Outcome: Completed/Met Date Met:  03/04/11 Wounds marked by ER MD, no dressings ordered.

## 2011-03-04 NOTE — Progress Notes (Signed)
Interval History: Sara [name] is an 77 year old female who was admitted with weakness and skin lesions. She was diagnosed with cellulitis involving her left forearm status post fall, a left thigh abscess, and also an abscess on the right labial fold. Culture was taken of the right labial wound. She was put on empiric Cipro and vancomycin by the admitting M.D. ROS: The patient states that the skin lesions are smaller and responding to the antibiotic. She denies any fever or chills. Her appetite is good with no nausea, vomiting or diarrhea.   Objective: Vital signs in last 24 hours: Temp:  [98.1 F (36.7 C)-98.9 F (37.2 C)] 98.9 F (37.2 C) (11/18 0615) Pulse Rate:  [65-108] 70  (11/18 1011) Resp:  [22-24] 24  (11/18 0615) BP: (126-134)/(62-76) 130/76 mmHg (11/18 1011) SpO2:  [92 %-97 %] 92 % (11/18 0615) Weight:  [90.719 kg (200 lb)] 200 lb (90.719 kg) (11/18 0100) Weight change:  Last BM Date: 03/02/11  Intake/Output from previous day:  Intake/Output Summary (Last 24 hours) at 03/04/11 1658 Last data filed at 03/04/11 0659  Gross per 24 hour  Intake 1364.58 ml  Output    300 ml  Net 1064.58 ml   CBG (last 3)   Basename 03/04/11 1208 03/04/11 0802 03/03/11 2307  GLUCAP 143* 111* 174*      Physical Exam:  Gen:  No acute distress. Cardiovascular:  Heart sounds regular with no murmurs, rubs, or gallops. Respiratory: Lungs clear to auscultation bilaterally. Gastrointestinal: Abdomen soft, nontender, nondistended with normal active bowel sounds. Extremities: No clubbing, edema, or cyanosis. Skin: Patient has an area on her left lateral forearm, left inner thigh, and right labial fold that are consistent with cellulitis. He has a small pustule on the left lateral forearm. There is a larger area of induration in the left inner thigh. These areas appear to be consistent with staph infection.   Lab Results: CBC    Component Value Date/Time   WBC 12.5* 03/03/2011 1435   WBC  8.1 08/09/2010 1338   RBC 4.50 03/03/2011 1435   HGB 13.3 03/03/2011 1435   HGB 12.5 08/09/2010 1338   HCT 39.9 03/03/2011 1435   HCT 36.8 08/09/2010 1338   PLT 171 03/03/2011 1435   PLT 209 08/09/2010 1338   MCV 88.7 03/03/2011 1435   MCV 87.3 08/09/2010 1338   MCH 29.6 03/03/2011 1435   MCHC 33.3 03/03/2011 1435   RDW 15.4 03/03/2011 1435   LYMPHSABS 2.4 03/03/2011 1435   MONOABS 1.1* 03/03/2011 1435   EOSABS 0.2 03/03/2011 1435   EOSABS 0.2 08/09/2010 1338   BASOSABS 0.0 03/03/2011 1435   BASOSABS 0.0 08/09/2010 1338    BMET    Component Value Date/Time   NA 139 03/04/2011 0535   K 3.8 03/04/2011 0535   CL 106 03/04/2011 0535   CO2 24 03/04/2011 0535   GLUCOSE 109* 03/04/2011 0535   BUN 12 03/04/2011 0535   CREATININE 0.54 03/04/2011 0535   CALCIUM 8.7 03/04/2011 0535   GFRNONAA 86* 03/04/2011 0535   GFRAA >90 03/04/2011 0535    Recent Results (from the past 240 hour(s))  WOUND CULTURE     Status: Normal (Preliminary result)   Collection Time   03/03/11  7:19 PM      Component Value Range Status Comment   Specimen Description VULVA RIGHT LABIA   Final    Special Requests NONE   Final    Gram Stain PENDING   Incomplete    Culture NO GROWTH  Final    Report Status PENDING   Incomplete     Studies/Results: Dg Knee Ap/lat W/sunrise Left  03/03/2011  *RADIOLOGY REPORT*  Clinical Data: Fall, knee swelling  DG KNEE - 3 VIEWS  Comparison: 12/22/2010  Findings: Moderate to severe tricompartmental degenerative change again identified.  Trace suprapatellar fluid noted.  The patella tracks appropriately in the patellofemoral groove.  No fracture or dislocation.  Vascular calcification noted.  IMPRESSION: Moderate to severe tricompartmental degenerative change.  No acute finding.  Original Report Authenticated By: Harrel Lemon, M.D.    Medications: Scheduled Meds:   . acetaminophen  650 mg Oral Once  . aspirin EC  81 mg Oral Daily  . B-complex with vitamin C  1 tablet  Oral Daily  . calcium carbonate  1 tablet Oral Daily  . ciprofloxacin  400 mg Intravenous Q12H  . clindamycin (CLEOCIN) IV  900 mg Intravenous To ER  . darifenacin  7.5 mg Oral Daily  . diltiazem  120 mg Oral Daily  . docusate sodium  100 mg Oral BID  . enoxaparin  40 mg Subcutaneous Q24H  . FLUoxetine  10 mg Oral Daily  . glipiZIDE  5 mg Oral QAC breakfast  . HYDROmorphone      .  HYDROmorphone (DILAUDID) injection  0.5 mg Intravenous Once  . insulin aspart  0-5 Units Subcutaneous QHS  . insulin aspart  0-9 Units Subcutaneous TID WC  . metFORMIN  500 mg Oral BID WC  . multivitamins ther. w/minerals  1 tablet Oral Daily  . naproxen  250 mg Oral BID WC  . niacin  1,000 mg Oral QHS  . omega-3 acid ethyl esters  1 g Oral BID  . potassium chloride SA  20 mEq Oral Daily  . propranolol  60 mg Oral Daily  . sodium chloride  1,000 mL Intravenous Once  . vancomycin  1,000 mg Intravenous Q12H  . DISCONTD: aspirin  81 mg Oral Daily  . DISCONTD: calcium carbonate  200 mg Oral Daily  . DISCONTD: multivitamin  1 capsule Oral Daily  . DISCONTD: naproxen sodium  220 mg Oral BID WC  . DISCONTD: omega-3 fish oil  1 capsule Oral BID  . DISCONTD: Vitamin-B Complex  1 tablet Oral 1 day or 1 dose   Continuous Infusions:   . sodium chloride 125 mL/hr at 03/04/11 0017   PRN Meds:.acetaminophen, acetaminophen, acetaminophen, alum & mag hydroxide-simeth, HYDROcodone-acetaminophen, HYDROmorphone, ondansetron (ZOFRAN) IV, ondansetron, senna  Assessment/Plan:  Principal Problem:  *Cellulitis and abscess The patient is on day #2 of vancomycin and Cipro. Followup wound cultures. Would also check her for nasal carriage of staph and if her screen is positive, start intranasal Bactroban. Would decontaminate skin with surgical scrub. Active Problems:  Diabetes mellitus type 2 in obese Sugars controlled on current regimen.  Fall PT/OT evaluation.  UTI (lower urinary tract infection) The patient is on  Cipro. This will cover urinary pathogens. Urine cultures do not appear to have been sent prior to the initiation of antibiotics.    LOS: 1 day   Hillery Aldo, MD Pager [phone]  03/04/2011, 4:58 PM

## 2011-03-04 NOTE — Plan of Care (Signed)
Problem: Phase II Progression Outcomes Goal: Tolerating diet Outcome: Completed/Met Date Met:  03/04/11 Eating carb mod diet

## 2011-03-04 NOTE — Plan of Care (Signed)
Problem: Phase I Progression Outcomes Goal: Initial discharge plan identified Outcome: Completed/Met Date Met:  03/04/11 Hopes to D/C to home.

## 2011-03-05 DIAGNOSIS — L03119 Cellulitis of unspecified part of limb: Secondary | ICD-10-CM

## 2011-03-05 DIAGNOSIS — IMO0002 Reserved for concepts with insufficient information to code with codable children: Secondary | ICD-10-CM

## 2011-03-05 DIAGNOSIS — L02419 Cutaneous abscess of limb, unspecified: Secondary | ICD-10-CM

## 2011-03-05 LAB — BASIC METABOLIC PANEL
BUN: 11 mg/dL (ref 6–23)
Calcium: 8.9 mg/dL (ref 8.4–10.5)
Creatinine, Ser: 0.58 mg/dL (ref 0.50–1.10)
GFR calc Af Amer: >90 mL/min (ref >90)
GFR calc non Af Amer: 84 mL/min — ABNORMAL LOW (ref >90)

## 2011-03-05 LAB — CBC
HCT: 35.8 % — ABNORMAL LOW (ref 36.0–46.0)
MCHC: 32.1 g/dL (ref 30.0–36.0)
Platelets: 143 10*3/uL — ABNORMAL LOW (ref 150–400)
RDW: 15.1 % (ref 11.5–15.5)
WBC: 10.3 10*3/uL (ref 4.0–10.5)

## 2011-03-05 MED ORDER — HALOPERIDOL LACTATE 5 MG/ML IJ SOLN
5.0000 mg | Freq: Once | INTRAMUSCULAR | Status: AC
  Administered 2011-03-05: 5 mg via INTRAVENOUS

## 2011-03-05 MED ORDER — MUPIROCIN 2 % EX OINT
TOPICAL_OINTMENT | Freq: Two times a day (BID) | CUTANEOUS | Status: DC
  Administered 2011-03-05 – 2011-03-12 (×12): via NASAL
  Filled 2011-03-05 (×2): qty 22

## 2011-03-05 MED ORDER — HALOPERIDOL LACTATE 5 MG/ML IJ SOLN
2.0000 mg | Freq: Four times a day (QID) | INTRAMUSCULAR | Status: DC | PRN
  Administered 2011-03-05 – 2011-03-11 (×3): 2 mg via INTRAVENOUS
  Filled 2011-03-05 (×4): qty 1

## 2011-03-05 MED ORDER — HALOPERIDOL LACTATE 5 MG/ML IJ SOLN
INTRAMUSCULAR | Status: AC
  Administered 2011-03-05: 5 mg via INTRAVENOUS
  Filled 2011-03-05: qty 1

## 2011-03-05 NOTE — Progress Notes (Signed)
ANTIBIOTIC CONSULT NOTE - FOLLOW UP  Pharmacy Consult for Vancomycin Indication: Multiple abscesses and spreading cellulitis  Allergies  Allergen Reactions  . Codeine Nausea Only and Other (See Comments)    In addition, most pain medications cause confusion, make patient light headed.    Patient Measurements: Height: 5\' 2"  (157.5 cm) Weight: 200 lb (90.719 kg) IBW/kg (Calculated) : 50.1    Vital Signs: Temp: 98.2 F (36.8 C) (11/19 0500) Temp src: Oral (11/19 0500) BP: 185/85 mmHg (11/19 0500) Pulse Rate: 69  (11/19 0500) Intake/Output from previous day: 11/18 0701 - 11/19 0700 In: 4523.8 [P.O.:1080; I.V.:3043.8; IV Piggyback:400] Out: 1250 [Urine:1250]  Labs:  [facility] 03/05/11 0415 03/04/11 0535 03/03/11 1435  WBC 10.3 -- 12.5*  HGB 11.5* -- 13.3  PLT 143* -- 171  LABCREA -- -- --  CREATININE 0.58 0.54 0.65   Estimated Creatinine Clearance: 56.7 ml/min (by C-G formula based on Cr of 0.58).  Microbiology: MRSA by PCR Screening = positive Wound culture vulva/right labia = moderate Staph aureus Blood and urine cultures = pending  Antibiotics: Day #3 Vancomycin 1000mg  IV q12h Day #3 Cipro 400mg  IV q12h  Assessment: Wound culture with Staph aureus Tmax 98.9 SCr stable WBCs improved  Goal of Therapy:  Vancomycin trough levels 15-20 mcg/ml Eradication of infection  Plan:  1. Continue current Vancomycin dose. 2. Trough Vancomycin level prior to 10 am dose on 11/20  [facility] 03/05/2011,11:06 AM

## 2011-03-05 NOTE — Progress Notes (Signed)
ot note  Pt states that she really doesn't want to do therapy.  Explained benefits.  Will reattempt later.

## 2011-03-05 NOTE — Consults (Signed)
Sara [name] is an 77 y.o. female.   Primary Care MD: Dewain Penning Requesting Physician: Lianne Moris Chief Complaintt: Cellulitis with multiple abscess HPI: Patient's 77 year old female who was brought to the emergency room and [facility] because of to falls and the last 3 days of progressive weakness the her family felt this was secondary to some abscess. The patient normally lives alone but has a very supportive family. Since admission she has been placed on IV Cipro and vancomycin. His sites on her left arm and left thigh has shown no appreciable improvement and we were asked to see in consultation.  Past Medical History  Diagnosis Date  . Cancer     breast - left  . Arthritis   . Diabetes mellitus   . Hypertension   . Generalized headaches   . Hearing loss   . Sore throat   . Confusion   . Weakness   . Mental disorder   . DEMENTIA   . Stroke 02-23-2011    recently diagnosed with mini-strokes    Past Surgical History  Procedure Date  . Back surgery     lower  . Vericose veins   . Abdominal hysterectomy   . Breast surgery   . Wrist surgery   . Cardiac catheterization   . Fracture surgery     left arm 01/2011  . Mastectomy     LEft breast with lymph node removeal   Review of systems: Since admission the only new problem is some mild confusion. She receive some Haldol for this early this morning but it appears to be improved. Her family says she's still not back to her baseline. No family history on file. Social History:  reports that she has never smoked. She has never used smokeless tobacco. She reports that she does not drink alcohol or use illicit drugs.  Allergies:  Allergies  Allergen Reactions  . Codeine Nausea Only and Other (See Comments)    In addition, most pain medications cause confusion, make patient light headed.    Medications Prior to Admission  Medication Dose Route Frequency Provider Last Rate Last Dose  . 0.9 %  sodium chloride  infusion   Intravenous Continuous Glenetta A. Lynch 125 mL/hr at 03/05/11 1418    . acetaminophen (TYLENOL) tablet 650 mg  650 mg Oral Q6H PRN Arne Cleveland, MD       Or  . acetaminophen (TYLENOL) suppository 650 mg  650 mg Rectal Q6H PRN Arne Cleveland, MD      . acetaminophen (TYLENOL) tablet 650 mg  650 mg Oral Once Arne Cleveland, MD   650 mg at 03/03/11 1948  . acetaminophen (TYLENOL) tablet 650 mg  650 mg Oral Q6H PRN Arne Cleveland, MD      . alum & mag hydroxide-simeth (MAALOX/MYLANTA) 200-200-20 MG/5ML suspension 30 mL  30 mL Oral Q6H PRN Arne Cleveland, MD      . aspirin EC tablet 81 mg  81 mg Oral Daily Arne Cleveland, MD   81 mg at 03/05/11 0900  . B-complex with vitamin C tablet 1 tablet  1 tablet Oral Daily Arne Cleveland, MD   1 tablet at 03/05/11 0900  . calcium carbonate (TUMS - dosed in mg elemental calcium) chewable tablet 200 mg of elemental calcium  1 tablet Oral Daily Arne Cleveland, MD   200 mg of elemental calcium at 03/05/11 0900  . chlorhexidine (HIBICLENS) 4 % liquid   Topical Daily Christina Rama      . ciprofloxacin (  CIPRO) IVPB 400 mg  400 mg Intravenous Q12H Arne Cleveland, MD   400 mg at 03/05/11 1226  . clindamycin (CLEOCIN) IVPB 900 mg  900 mg Intravenous To ER Gerhard Munch, MD   900 mg at 03/03/11 1721  . darifenacin (ENABLEX) 24 hr tablet 7.5 mg  7.5 mg Oral Daily Arne Cleveland, MD   7.5 mg at 03/05/11 0900  . diltiazem (CARDIZEM CD) 24 hr capsule 120 mg  120 mg Oral Daily Arne Cleveland, MD   120 mg at 03/05/11 0900  . docusate sodium (COLACE) capsule 100 mg  100 mg Oral BID Arne Cleveland, MD   100 mg at 03/05/11 0900  . enoxaparin (LOVENOX) injection 40 mg  40 mg Subcutaneous Q24H Arne Cleveland, MD   40 mg at 03/04/11 2135  . FLUoxetine (PROZAC) capsule 10 mg  10 mg Oral Daily Arne Cleveland, MD   10 mg at 03/05/11 0900  . glipiZIDE (GLUCOTROL XL) 24 hr tablet 5 mg  5 mg Oral QAC breakfast Arne Cleveland, MD   5 mg at 03/05/11 0900  . haloperidol lactate (HALDOL) injection 2 mg  2 mg  Intravenous Q6H PRN Lavene A. Lynch   2 mg at 03/05/11 0212  . HYDROcodone-acetaminophen (NORCO) 5-325 MG per tablet 1-2 tablet  1-2 tablet Oral Q4H PRN Arne Cleveland, MD      . HYDROmorphone (DILAUDID) 2 MG/ML injection           . HYDROmorphone (DILAUDID) injection 0.5 mg  0.5 mg Intravenous Once Gerhard Munch, MD      . insulin aspart (novoLOG) injection 0-5 Units  0-5 Units Subcutaneous QHS Arne Cleveland, MD      . insulin aspart (novoLOG) injection 0-9 Units  0-9 Units Subcutaneous TID WC Arne Cleveland, MD   2 Units at 03/05/11 1300  . metFORMIN (GLUCOPHAGE) tablet 500 mg  500 mg Oral BID WC Arne Cleveland, MD   500 mg at 03/05/11 0900  . multivitamins ther. w/minerals tablet 1 tablet  1 tablet Oral Daily Arne Cleveland, MD      . mupirocin ointment (BACTROBAN) 2 %   Nasal BID Christina Rama      . naproxen (NAPROSYN) tablet 250 mg  250 mg Oral BID WC Arne Cleveland, MD   250 mg at 03/05/11 0900  . niacin tablet 1,000 mg  1,000 mg Oral QHS Arne Cleveland, MD   1,000 mg at 03/04/11 2135  . omega-3 acid ethyl esters (LOVAZA) capsule 1 g  1 g Oral BID Arne Cleveland, MD   1 g at 03/05/11 0900  . ondansetron (ZOFRAN) tablet 4 mg  4 mg Oral Q6H PRN Arne Cleveland, MD       Or  . ondansetron [facility]) injection 4 mg  4 mg Intravenous Q6H PRN Arne Cleveland, MD   4 mg at 03/05/11 0935  . potassium chloride SA (K-DUR,KLOR-CON) CR tablet 20 mEq  20 mEq Oral Daily Arne Cleveland, MD   20 mEq at 03/05/11 0900  . propranolol (INDERAL) tablet 60 mg  60 mg Oral Daily Arne Cleveland, MD   60 mg at 03/05/11 0900  . senna (SENOKOT) tablet 17.2 mg  2 tablet Oral Daily PRN Arne Cleveland, MD      . sodium chloride 0.9 % bolus 1,000 mL  1,000 mL Intravenous Once Gerhard Munch, MD   1,000 mL at 03/03/11 1720  . vancomycin (VANCOCIN) IVPB 1000 mg/200 mL premix  1,000 mg Intravenous Q12H Arne Cleveland, MD   1,000 mg at 03/05/11  1000  . DISCONTD: aspirin tablet 81 mg  81 mg Oral Daily Arne Cleveland, MD      . DISCONTD: calcium carbonate  capsule 200 mg  200 mg Oral Daily Arne Cleveland, MD      . DISCONTD: HYDROmorphone (DILAUDID) injection 0.5 mg  0.5 mg Intravenous Q4H PRN Arne Cleveland, MD      . DISCONTD: multivitamin capsule 1 capsule  1 capsule Oral Daily Arne Cleveland, MD      . DISCONTD: naproxen sodium (ANAPROX) tablet 220 mg  220 mg Oral BID WC Arne Cleveland, MD      . DISCONTD: omega-3 fish oil (MAXEPA) capsule 1,000 mg  1 capsule Oral BID Arne Cleveland, MD      . DISCONTD: Vitamin-B Complex TABS 1 tablet  1 tablet Oral 1 day or 1 dose Arne Cleveland, MD       Medications Prior to Admission  Medication Sig Dispense Refill  . acetaminophen (TYLENOL) 325 MG tablet Take 650 mg by mouth every 4 (four) hours as needed. pain      . aspirin 81 MG tablet Take 81 mg by mouth daily.        . B Complex Vitamins (VITAMIN-B COMPLEX PO) Take by mouth daily.        . calcium carbonate 200 MG capsule Take 250 mg by mouth daily.        Marland Kitchen diltiazem (CARDIZEM CD) 120 MG 24 hr capsule Take 120 mg by mouth daily.       Marland Kitchen FLUoxetine (PROZAC) 10 MG capsule Take 10 mg by mouth daily.       Marland Kitchen KLOR-CON M20 20 MEQ tablet Take 20 mEq by mouth daily.       . metFORMIN (GLUCOPHAGE) 500 MG tablet Take 500 mg by mouth 2 (two) times daily with a meal.       . Multiple Vitamin (MULTIVITAMIN) capsule Take 1 capsule by mouth daily.        . Naproxen Sodium (ALEVE PO) Take 1 tablet by mouth daily as needed. Pain.      . niacin 500 MG tablet Take 1,000 mg by mouth at bedtime.       . Omega-3 Fatty Acids (OMEGA-3 FISH OIL PO) Take 1 capsule by mouth daily.       . propranolol (INDERAL) 60 MG tablet Take 60 mg by mouth daily.       . VESICARE 5 MG tablet Take 5 mg by mouth daily.           Blood pressure 145/80, pulse 62, temperature 97.8 F (36.6 C), temperature source Oral, resp. rate 20, height 5\' 2"  (1.575 m), weight 90.719 kg (200 lb), SpO2 81.00%. Physical exam: Well-nourished well-developed 77 year old white female in no acute distress slightly  confused Head: Normocephalic EENT: Essentially normal mucosa is normal. Neck: No JVD number is no thyromegaly. Chest: Clear to auscultation with a few basilar rales at the bases. Cardiac: Normal S1 and S2. Abdomen: Obese nontender no palpable hepatomegaly positive bowel sound. GU/rectal there is a 0.5 cm area on the right labia. Extremities: Left upper [place] she has a 1.5 cm area which is slightly fluctuant. With erythema surrounding it. Left thigh: There is a 2 cm area which is indurated with a fluctuant area in the midportion and erythema surrounding it. Neurologic: No focal cranial nerves are grossly intact. Psych: Slightly confused and disoriented to time. Impression: 1. Abscesses left arm, left thigh right labia. 2. MRSA by PCR. 3. Staph aureus on  culture. 4. History left breast cancer. 5. Diabetes mellitus Type II      Plan: Apply warm compresses tonight. K pad, I&D traded bedside we'll decide on incision and drainage of the arm and thigh abscess tomorrow. This is will [facility] physician assistant for Dr. Claud Kelp.    Results for orders placed during the hospital encounter of 03/03/11 (from the past 48 hour(s))  GLUCOSE, CAPILLARY     Status: Abnormal   Collection Time   03/03/11  4:04 PM      Component Value Range Comment   Glucose-Capillary 111 (*) 70 - 99 (mg/dL)    Comment 1 Documented in Chart      Comment 2 Notify RN     URINALYSIS, ROUTINE W REFLEX MICROSCOPIC     Status: Abnormal   Collection Time   03/03/11  4:19 PM      Component Value Range Comment   Color, Urine AMBER (*) YELLOW  BIOCHEMICALS MAY BE AFFECTED BY COLOR   Appearance CLOUDY (*) CLEAR     Specific Gravity, Urine 1.028  1.005 - 1.030     pH 5.0  5.0 - 8.0     Glucose, UA 100 (*) NEGATIVE (mg/dL)    Hgb urine dipstick NEGATIVE  NEGATIVE     Bilirubin Urine SMALL (*) NEGATIVE     Ketones, ur TRACE (*) NEGATIVE (mg/dL)    Protein, ur NEGATIVE  NEGATIVE (mg/dL)    Urobilinogen, UA 0.2  0.0 -  1.0 (mg/dL)    Nitrite NEGATIVE  NEGATIVE     Leukocytes, UA SMALL (*) NEGATIVE    URINE MICROSCOPIC-ADD ON     Status: Abnormal   Collection Time   03/03/11  4:19 PM      Component Value Range Comment   Squamous Epithelial / LPF RARE  RARE     WBC, UA 7-10  <3 (WBC/hpf)    Bacteria, UA MANY (*) RARE     Crystals CA OXALATE CRYSTALS (*) NEGATIVE    WOUND CULTURE     Status: Normal (Preliminary result)   Collection Time   03/03/11  7:19 PM      Component Value Range Comment   Specimen Description VULVA RIGHT LABIA      Special Requests NONE      Gram Stain PENDING      Culture        Value: MODERATE STAPHYLOCOCCUS AUREUS     Note: RIFAMPIN AND GENTAMICIN SHOULD NOT BE USED AS SINGLE DRUGS FOR TREATMENT OF STAPH INFECTIONS.   Report Status PENDING     GLUCOSE, CAPILLARY     Status: Abnormal   Collection Time   03/03/11 11:07 PM      Component Value Range Comment   Glucose-Capillary 174 (*) 70 - 99 (mg/dL)    Comment 1 Notify RN     COMPREHENSIVE METABOLIC PANEL     Status: Abnormal   Collection Time   03/04/11  5:35 AM      Component Value Range Comment   Sodium 139  135 - 145 (mEq/L)    Potassium 3.8  3.5 - 5.1 (mEq/L)    Chloride 106  96 - 112 (mEq/L)    CO2 24  19 - 32 (mEq/L)    Glucose, Bld 109 (*) 70 - 99 (mg/dL)    BUN 12  6 - 23 (mg/dL)    Creatinine, Ser 1.61  0.50 - 1.10 (mg/dL)    Calcium 8.7  8.4 - 10.5 (mg/dL)    Total  Protein 5.5 (*) 6.0 - 8.3 (g/dL)    Albumin 2.6 (*) 3.5 - 5.2 (g/dL)    AST 18  0 - 37 (U/L)    ALT 25  0 - 35 (U/L)    Alkaline Phosphatase 72  39 - 117 (U/L)    Total Bilirubin 0.8  0.3 - 1.2 (mg/dL)    GFR calc non Af Amer 86 (*) >90 (mL/min)    GFR calc Af Amer >90  >90 (mL/min)   GLUCOSE, CAPILLARY     Status: Abnormal   Collection Time   03/04/11  8:02 AM      Component Value Range Comment   Glucose-Capillary 111 (*) 70 - 99 (mg/dL)   GLUCOSE, CAPILLARY     Status: Abnormal   Collection Time   03/04/11 12:08 PM      Component  Value Range Comment   Glucose-Capillary 143 (*) 70 - 99 (mg/dL)   GLUCOSE, CAPILLARY     Status: Abnormal   Collection Time   03/04/11  5:03 PM      Component Value Range Comment   Glucose-Capillary 62 (*) 70 - 99 (mg/dL)   GLUCOSE, CAPILLARY     Status: Abnormal   Collection Time   03/04/11  6:58 PM      Component Value Range Comment   Glucose-Capillary 138 (*) 70 - 99 (mg/dL)   MRSA PCR SCREENING     Status: Abnormal   Collection Time   03/04/11  7:15 PM      Component Value Range Comment   MRSA by PCR POSITIVE (*) NEGATIVE    BASIC METABOLIC PANEL     Status: Abnormal   Collection Time   03/05/11  4:15 AM      Component Value Range Comment   Sodium 142  135 - 145 (mEq/L)    Potassium 3.8  3.5 - 5.1 (mEq/L)    Chloride 110  96 - 112 (mEq/L)    CO2 23  19 - 32 (mEq/L)    Glucose, Bld 138 (*) 70 - 99 (mg/dL)    BUN 11  6 - 23 (mg/dL)    Creatinine, Ser 1.61  0.50 - 1.10 (mg/dL)    Calcium 8.9  8.4 - 10.5 (mg/dL)    GFR calc non Af Amer 84 (*) >90 (mL/min)    GFR calc Af Amer >90  >90 (mL/min)   CBC     Status: Abnormal   Collection Time   03/05/11  4:15 AM      Component Value Range Comment   WBC 10.3  4.0 - 10.5 (K/uL)    RBC 4.01  3.87 - 5.11 (MIL/uL)    Hemoglobin 11.5 (*) 12.0 - 15.0 (g/dL)    HCT 09.6 (*) 04.5 - 46.0 (%)    MCV 89.3  78.0 - 100.0 (fL)    MCH 28.7  26.0 - 34.0 (pg)    MCHC 32.1  30.0 - 36.0 (g/dL)    RDW 40.9  81.1 - 91.4 (%)    Platelets 143 (*) 150 - 400 (K/uL)   GLUCOSE, CAPILLARY     Status: Abnormal   Collection Time   03/05/11  7:59 AM      Component Value Range Comment   Glucose-Capillary 133 (*) 70 - 99 (mg/dL)   GLUCOSE, CAPILLARY     Status: Abnormal   Collection Time   03/05/11 11:42 AM      Component Value Range Comment   Glucose-Capillary 159 (*) 70 - 99 (mg/dL)  GLUCOSE, CAPILLARY     Status: Normal   Collection Time   03/05/11  3:58 PM      Component Value Range Comment   Glucose-Capillary 95  70 - 99 (mg/dL)    Dg Knee  Ap/lat W/sunrise Left  03/03/2011  *RADIOLOGY REPORT*  Clinical Data: Fall, knee swelling  DG KNEE - 3 VIEWS  Comparison: 12/22/2010  Findings: Moderate to severe tricompartmental degenerative change again identified.  Trace suprapatellar fluid noted.  The patella tracks appropriately in the patellofemoral groove.  No fracture or dislocation.  Vascular calcification noted.  IMPRESSION: Moderate to severe tricompartmental degenerative change.  No acute finding.  Original Report Authenticated By: Harrel Lemon, M.D.       Assessment/Plan See above  Keilon Ressel 03/05/2011, 4:03 PM

## 2011-03-05 NOTE — Progress Notes (Addendum)
Interval History: Mrs. Wamboldt is an 77 year old female who was admitted with weakness and skin lesions. She was diagnosed with cellulitis involving her left forearm status post fall, a left thigh abscess, and also an abscess on the right labial fold. Culture was taken of the right labial wound. She was put on empiric Cipro and vancomycin by the admitting M.D.  ROS: Mrs. Gallego is more confused today per her family.  She thinks her soft tissue infections are a little better.  She denies N/V, diarrhea.   Objective: Vital signs in last 24 hours: Temp:  [98.2 F (36.8 C)-98.9 F (37.2 C)] 98.2 F (36.8 C) (11/19 0500) Pulse Rate:  [61-69] 69  (11/19 0500) Resp:  [20] 20  (11/19 0500) BP: (107-185)/(58-85) 185/85 mmHg (11/19 0500) SpO2:  [91 %-94 %] 94 % (11/19 0500) Weight change:  Last BM Date: 03/02/11  Intake/Output from previous day:  Intake/Output Summary (Last 24 hours) at 03/05/11 1311 Last data filed at 03/05/11 1008  Gross per 24 hour  Intake 4043.75 ml  Output   1650 ml  Net 2393.75 ml   CBG (last 3)   Basename 03/05/11 1142 03/05/11 0759 03/04/11 1858  GLUCAP 159* 133* 138*      Physical Exam:  Gen: No acute distress.  Cardiovascular: Heart sounds regular with no murmurs, rubs, or gallops.  Respiratory: Lungs clear to auscultation bilaterally.  Gastrointestinal: Abdomen soft, nontender, nondistended with normal active bowel sounds.  Extremities: No clubbing, edema, or cyanosis.  Skin: Patient has an area on her left lateral forearm, left inner thigh, and right labial fold that are consistent with cellulitis. He has a small pustule on the left lateral forearm. There is a larger area of induration in the left inner thigh. These areas appear to be consistent with staph infection.  There is less intense erythema than there was yesterday.   Lab Results: CBC    Component Value Date/Time   WBC 10.3 03/05/2011 0415   WBC 8.1 08/09/2010 1338   RBC 4.01 03/05/2011  0415   RBC 4.21 08/09/2010 1338   HGB 11.5* 03/05/2011 0415   HGB 12.5 08/09/2010 1338   HCT 35.8* 03/05/2011 0415   HCT 36.8 08/09/2010 1338   PLT 143* 03/05/2011 0415   PLT 209 08/09/2010 1338   MCV 89.3 03/05/2011 0415   MCV 87.3 08/09/2010 1338   MCH 28.7 03/05/2011 0415   MCH 29.6 08/09/2010 1338   MCHC 32.1 03/05/2011 0415   MCHC 33.9 08/09/2010 1338   RDW 15.1 03/05/2011 0415   RDW 15.9* 08/09/2010 1338   LYMPHSABS 2.4 03/03/2011 1435   LYMPHSABS 1.8 08/09/2010 1338   MONOABS 1.1* 03/03/2011 1435   MONOABS 0.5 08/09/2010 1338   EOSABS 0.2 03/03/2011 1435   EOSABS 0.2 08/09/2010 1338   BASOSABS 0.0 03/03/2011 1435   BASOSABS 0.0 08/09/2010 1338    BMET    Component Value Date/Time   NA 142 03/05/2011 0415   K 3.8 03/05/2011 0415   CL 110 03/05/2011 0415   CO2 23 03/05/2011 0415   GLUCOSE 138* 03/05/2011 0415   BUN 11 03/05/2011 0415   CREATININE 0.58 03/05/2011 0415   CALCIUM 8.9 03/05/2011 0415   GFRNONAA 84* 03/05/2011 0415   GFRAA >90 03/05/2011 0415    Recent Results (from the past 240 hour(s))  CULTURE, BLOOD (SINGLE)     Status: Normal (Preliminary result)   Collection Time   03/03/11  2:35 PM      Component Value Range Status Comment  Specimen Description BLOOD RIGHT ARM   Final    Special Requests BOTTLES DRAWN AEROBIC ONLY 5CC   Final    Setup Time [id number]   Final    Culture     Final    Value:        BLOOD CULTURE RECEIVED NO GROWTH TO DATE CULTURE WILL BE HELD FOR 5 DAYS BEFORE ISSUING A FINAL NEGATIVE REPORT   Report Status PENDING   Incomplete   WOUND CULTURE     Status: Normal (Preliminary result)   Collection Time   03/03/11  7:19 PM      Component Value Range Status Comment   Specimen Description VULVA RIGHT LABIA   Final    Special Requests NONE   Final    Gram Stain PENDING   Incomplete    Culture     Final    Value: MODERATE STAPHYLOCOCCUS AUREUS     Note: RIFAMPIN AND GENTAMICIN SHOULD NOT BE USED AS SINGLE DRUGS FOR TREATMENT OF STAPH  INFECTIONS.   Report Status PENDING   Incomplete   MRSA PCR SCREENING     Status: Abnormal   Collection Time   03/04/11  7:15 PM      Component Value Range Status Comment   MRSA by PCR POSITIVE (*) NEGATIVE  Final     Studies/Results: Dg Knee Ap/lat W/sunrise Left  03/03/2011  *RADIOLOGY REPORT*  Clinical Data: Fall, knee swelling  DG KNEE - 3 VIEWS  Comparison: 12/22/2010  Findings: Moderate to severe tricompartmental degenerative change again identified.  Trace suprapatellar fluid noted.  The patella tracks appropriately in the patellofemoral groove.  No fracture or dislocation.  Vascular calcification noted.  IMPRESSION: Moderate to severe tricompartmental degenerative change.  No acute finding.  Original Report Authenticated By: Harrel Lemon, M.D.    Medications: Scheduled Meds:   . aspirin EC  81 mg Oral Daily  . B-complex with vitamin C  1 tablet Oral Daily  . calcium carbonate  1 tablet Oral Daily  . chlorhexidine   Topical Daily  . ciprofloxacin  400 mg Intravenous Q12H  . darifenacin  7.5 mg Oral Daily  . diltiazem  120 mg Oral Daily  . docusate sodium  100 mg Oral BID  . enoxaparin  40 mg Subcutaneous Q24H  . FLUoxetine  10 mg Oral Daily  . glipiZIDE  5 mg Oral QAC breakfast  .  HYDROmorphone (DILAUDID) injection  0.5 mg Intravenous Once  . insulin aspart  0-5 Units Subcutaneous QHS  . insulin aspart  0-9 Units Subcutaneous TID WC  . metFORMIN  500 mg Oral BID WC  . multivitamins ther. w/minerals  1 tablet Oral Daily  . naproxen  250 mg Oral BID WC  . niacin  1,000 mg Oral QHS  . omega-3 acid ethyl esters  1 g Oral BID  . potassium chloride SA  20 mEq Oral Daily  . propranolol  60 mg Oral Daily  . vancomycin  1,000 mg Intravenous Q12H   Continuous Infusions:   . sodium chloride 125 mL/hr at 03/05/11 0307   PRN Meds:.acetaminophen, acetaminophen, acetaminophen, alum & mag hydroxide-simeth, haloperidol lactate, HYDROcodone-acetaminophen, ondansetron (ZOFRAN)  IV, ondansetron, senna, DISCONTD: HYDROmorphone  Assessment/Plan:  Principal Problem:  *Cellulitis and abscess  The patient is on day #3 of vancomycin and Cipro. Wound cultures are growing staph aureus. Her nasal swab was positive for MRSA.. Will start intranasal Bactroban. Would decontaminate skin with surgical scrub. She has been seen by the wound care nurse who  recommends surgical consultation for incision and drainage of her soft tissue infections. I have requested a surgical consultation. Active Problems:  Diabetes mellitus type 2 in obese  Sugars controlled on current regimen.  Fall  PT/OT evaluation.  UTI (lower urinary tract infection)  The patient is on Cipro. This will cover urinary pathogens. Urine cultures do not appear to have been sent prior to the initiation of antibiotics.  AMS D/C narcotics.     LOS: 2 days   Hillery Aldo, MD Pager [phone]  03/05/2011, 1:11 PM

## 2011-03-05 NOTE — Progress Notes (Signed)
PT eval deferred today due to patient being more confused. Has pulled out catheter per RN. Will f/u tomorrow.

## 2011-03-05 NOTE — Consult Note (Signed)
WOC consult Note Reason for Consult: Consult requested for multiple skin lesions. Wound type: Red pustular vesicles , each measuring .2X.2 cm no open wounds or drainage, very painful to touch, erythremia surrounding in area approx 4cm, hard to palpation. Affected areas include right labia, left thigh, left arm. Topical care will not be effective in promoting healing.  Recommend surgical consult for I&D to sites. Will not plan to follow further unless re-consulted.  [address] RN, MSN, [employer]  [phone]

## 2011-03-05 NOTE — Plan of Care (Signed)
Problem: Phase III Progression Outcomes Goal: Temperature < 100 Outcome: Completed/Met Date Met:  03/05/11 Temp < 100 all day

## 2011-03-06 DIAGNOSIS — R4182 Altered mental status, unspecified: Secondary | ICD-10-CM | POA: Diagnosis not present

## 2011-03-06 LAB — WOUND CULTURE

## 2011-03-06 LAB — GLUCOSE, CAPILLARY
Glucose-Capillary: 149 mg/dL — ABNORMAL HIGH (ref 70–99)
Glucose-Capillary: 154 mg/dL — ABNORMAL HIGH (ref 70–99)

## 2011-03-06 MED ORDER — KETOROLAC TROMETHAMINE 30 MG/ML IJ SOLN
30.0000 mg | Freq: Four times a day (QID) | INTRAMUSCULAR | Status: AC | PRN
  Filled 2011-03-06: qty 1

## 2011-03-06 MED ORDER — LORAZEPAM 2 MG/ML IJ SOLN
1.0000 mg | Freq: Once | INTRAMUSCULAR | Status: DC
  Filled 2011-03-06: qty 1

## 2011-03-06 NOTE — Progress Notes (Signed)
Occupational Therapy Evaluation Patient Details Name: Sara [name] MRN: [id number] DOB: [birth year] Today's Date: 03/07/2011 9:37-10:08  Ev II Problem List:  Patient Active Problem List  Diagnoses  . History of breast cancer in female  . Diabetes mellitus type 2 in obese  . Hypertension associated with diabetes  . Hypertension associated with diabetes  . Uncomplicated senile dementia  . Osteoarthritis (arthritis due to wear and tear of joints)  . Lumbar disc disease  . Cellulitis and abscess  . Fall  . UTI (lower urinary tract infection)  . Altered mental status    Past Medical History:  Past Medical History  Diagnosis Date  . Cancer     breast - left  . Arthritis   . Diabetes mellitus   . Hypertension   . Generalized headaches   . Hearing loss   . Sore throat   . Confusion   . Weakness   . Mental disorder   . DEMENTIA   . Stroke 02-23-2011    recently diagnosed with mini-strokes   Past Surgical History:  Past Surgical History  Procedure Date  . Back surgery     lower  . Vericose veins   . Abdominal hysterectomy   . Breast surgery   . Wrist surgery   . Cardiac catheterization   . Fracture surgery     left arm 01/2011  . Mastectomy     LEft breast with lymph node removeal    OT Assessment/Plan/Recommendation OT Assessment Clinical Impression Statement: Pleasant 77 year old female admitted with cellulitis of the LLE.  She presents with overall weakness in her LE's and right shoulder.  Currently she needs min assist for balance with simulated toildet transfers and overall mod assist for LB selfcare tasks.  Prior to admission she was mod I for most basic selfcare and had some assist for hommaking and meals.  Feel she willl benefit from acute rehab services to help progress back to a mod I level.  Do feel that she will need initial 24 hour supervision based on her balance and increased chance of falls. If 24 hour supervision is not available recommend short SNF  level rehab prio to home.  If 24 hour supervision is availble for d/c from acute services, recommend home healt OT. OT Recommendation/Assessment: Patient will need skilled OT in the acute care venue OT Problem List: Decreased strength;Impaired balance (sitting and/or standing);Decreased knowledge of use of DME or AE Barriers to Discharge: Decreased caregiver support Barriers to Discharge Comments: Pt lived alone and family is unsure if they can provide 24 hour supervision. OT Therapy Diagnosis : Generalized weakness OT Plan OT Frequency: Min 2X/week OT Treatment/Interventions: Self-care/ADL training;Therapeutic exercise;DME and/or AE instruction;Patient/family education;Balance training;Therapeutic activities OT Recommendation Follow Up Recommendations: Skilled nursing facility Equipment Recommended: None recommended by OT Individuals Consulted Consulted and Agree with Results and Recommendations: Patient OT Goals Acute Rehab OT Goals OT Goal Formulation: With patient Time For Goal Achievement: 2 weeks ADL Goals Pt Will Perform Grooming: with modified independence;Standing at sink ADL Goal: Grooming - Progress: Progressing toward goals Pt Will Perform Lower Body Bathing: with supervision;Sit to stand from bed;with adaptive equipment ADL Goal: Lower Body Bathing - Progress: Progressing toward goals Pt Will Perform Upper Body Dressing: with set-up;Sitting, bed;Unsupported ADL Goal: Upper Body Dressing - Progress: Progressing toward goals Pt Will Perform Lower Body Dressing: with supervision;with adaptive equipment;Sit to stand from bed ADL Goal: Lower Body Dressing - Progress: Progressing toward goals Pt Will Transfer to Toilet: with modified independence;with  DME;Ambulation;3-in-1 ADL Goal: Toilet Transfer - Progress: Progressing toward goals Pt Will Perform Toileting - Clothing Manipulation: with modified independence;Sitting on 3-in-1 or toilet ADL Goal: Toileting - Clothing  Manipulation - Progress: Progressing toward goals Pt Will Perform Toileting - Hygiene: with modified independence;Sit to stand from 3-in-1/toilet ADL Goal: Toileting - Hygiene - Progress: Progressing toward goals Arm Goals Additional Arm Goal #1: Pt will increase right shoulder strength to 3-/5 in order to increase functional use with all selfcare and grooming tasks.  OT Evaluation Precautions/Restrictions  Restrictions Weight Bearing Restrictions: No Prior Functioning Home Living Lives With: Alone Receives Help From: Family Type of Home: House Home Layout: One level Home Access: Stairs to enter Entrance Stairs-Rails: Right Entrance Stairs-Number of Steps: 5-6 Bathroom Shower/Tub: Walk-in shower (Pt sponge bathes and does not use shower.) Bathroom Toilet: Standard Home Adaptive Equipment: Grab bars in shower;Shower chair with back;Walker - rolling;Quad cane;Straight cane;Bedside commode/3-in-1 Additional Comments: Pt has built in shower seat. Prior Function Level of Independence: Requires assistive device for independence;Needs assistance with homemaking Meal Prep: Supervision/set-up (Other family friends would bring food as well.) Driving: Yes (She basically just drove to church.) Vocation: Retired ADL ADL Eating/Feeding: Simulated;Independent Grooming: Performed;Brushing hair;Set up Where Assessed - Grooming: Sitting, chair Upper Body Bathing: Simulated;Supervision/safety Where Assessed - Upper Body Bathing: Unsupported;Sitting, bed Lower Body Bathing: Simulated;Moderate assistance Where Assessed - Lower Body Bathing: Sit to stand from bed Upper Body Dressing: Simulated;Minimal assistance Where Assessed - Upper Body Dressing: Unsupported;Sitting, bed Lower Body Dressing: Performed;Moderate assistance Where Assessed - Lower Body Dressing: Sit to stand from bed Toilet Transfer: Simulated;Minimal assistance Toilet Transfer Method: Stand pivot Toilet Transfer Equipment:  Bedside commode Toileting - Clothing Manipulation: Simulated;Minimal assistance Where Assessed - Toileting Clothing Manipulation: Sit on 3-in-1 or toilet Toileting - Hygiene: Simulated Where Assessed - Toileting Hygiene: Sit on 3-in-1 or toilet Tub/Shower Transfer: Not assessed Equipment Used: Rolling walker ADL Comments: Pt exhibited increased difficulty reaching either foot for dressing and bathing tasks. Vision/Perception  Vision - History Baseline Vision: Wears glasses all the time Cognition Cognition Arousal/Alertness: Awake/alert Overall Cognitive Status: Appears within functional limits for tasks assessed Orientation Level: Oriented to place;Oriented to person;Oriented to situation Sensation/Coordination Sensation Light Touch: Appears Intact Extremity Assessment RUE AROM (degrees) Right Shoulder Flexion  0-170: 20 Degrees (Shoulder flexion AAROM wfls, PROM limited) RUE Strength Right Shoulder Flexion: 2-/5 Right Elbow Flexion: 3+/5 Gross Grasp: Functional LUE Strength LUE Overall Strength: Deficits;Other (Comment) (Overall strength 4/5 throughout.) Mobility  Bed Mobility Bed Mobility: Yes Supine to Sit: 3: Mod assist;With rails;HOB elevated (Comment degrees) Supine to Sit Details (indicate cue type and reason): HOB elevated approximately 40 degrees. Transfers Sit to Stand: 4: Min assist;With upper extremity assist Exercises   End of Session OT - End of Session Equipment Utilized During Treatment: Gait belt;Other (comment) (rollling walker) Activity Tolerance: Patient tolerated treatment well Patient left: in chair;with call bell in reach Nurse Communication: Mobility status for transfers General Behavior During Session: [facility] for tasks performed Cognition: [facility] for tasks performed   Daronte Shostak OTR/L 03/07/2011, 12:23 PM  Pager number [phone]

## 2011-03-06 NOTE — Progress Notes (Addendum)
OT Cancellation Note Orders received for OT consult.  Pt currently just underwent procedure and MD is recommending to hold therapy for today and resume tomorrow.  Will check back 11/21 to proceed with OT eval.  Perrin Maltese, OTR/L Pager number [phone] 03/06/2011

## 2011-03-06 NOTE — Op Note (Signed)
Patient Name:   REYAH STREETER  Date of Surgery:   03/06/2011  Pre op Diagnosis:   Abscess left thigh  Post op Diagnosis:   Abscess left thigh  Procedure:    Incision and drainage abscess left thigh  Surgeon:   Angelia Mould. Derrell Lolling, M.D., FACS  Assistant:   Zola Button, [place]  Operative Indications:   This patient is an 77 year old female with recurrent skin lesions and multiple medical problems. She has had wound cultures which are positive for MRSA. On the left medial thigh there is a small superficial abscess with a 1 cm central area of necrosis and some erythema for about 4 cm. This seemed somewhat fluctuant. We have elected to open this up to promote drainage  Operative Findings:     Procedure in Detail:   The procedure was performed at the bedside. Two nursing personnel assisted. The patient was placed in frog-leg position. The left upper medial thigh was prepped and draped in sterile fashion. 1% Xylocaine with epinephrine was used as a local infiltration anesthetic. With a sharp knife we completely excised the central necrotic skin and explored the wound digitally and with a hemostat. There was some purulent fluid which was cultured. We broke up all the superficial loculations, irrigated the wound and packed it loosely with gauze. The wound was dressed with dry gauze.She tolerated the procedure well. EBL was 5 cc.   Ernestene Mention 03/06/2011 2:54 PM

## 2011-03-06 NOTE — Progress Notes (Signed)
Interval History: Sara [name] is an 77 year old female who was admitted with weakness and skin lesions. She was diagnosed with cellulitis involving her left forearm status post fall, a left thigh abscess, and also an abscess on the right labial fold. Culture was taken of the right labial wound. She was put on empiric Cipro and vancomycin by the admitting M.D. wound cultures are growing MRSA. The surgical consultants are planning a bedside I and D. of her left inner thigh abscess today.  ROS: Sara [name] develop significant confusion overnight. She required Haldol. She continues to have discomfort in the areas of her skin that are affected by the soft tissue abscesses.   Objective: Vital signs in last 24 hours: Temp:  [97.8 F (36.6 C)-98.2 F (36.8 C)] 98.2 F (36.8 C) (11/20 0530) Pulse Rate:  [62-88] 73  (11/20 0530) Resp:  [18-20] 18  (11/20 0530) BP: (114-145)/(63-80) 114/63 mmHg (11/20 0530) SpO2:  [81 %-96 %] 92 % (11/20 0530) Weight change:  Last BM Date: 03/02/11  Intake/Output from previous day:  Intake/Output Summary (Last 24 hours) at 03/06/11 1415 Last data filed at 03/06/11 0600  Gross per 24 hour  Intake 3562.5 ml  Output      0 ml  Net 3562.5 ml     Physical Exam:  Gen: No acute distress.  Cardiovascular: Heart sounds regular with no murmurs, rubs, or gallops.  Respiratory: Lungs clear to auscultation bilaterally.  Gastrointestinal: Abdomen soft, nontender, nondistended with normal active bowel sounds.  Extremities: No clubbing, edema, or cyanosis.  Skin: Patient has an area on her left lateral forearm, left inner thigh, and right labial fold that are consistent with cellulitis. He has a small pustule on the left lateral forearm, which is now draining. There is a larger area of induration in the left inner thigh. These areas appear to be consistent with staph infection. There is less intense erythema than there was yesterday.   Lab Results: Basic Metabolic  Panel:  Lab 03/05/11 0415 03/04/11 0535 03/03/11 1435  NA 142 139 140  K 3.8 3.8 --  CL 110 106 105  CO2 23 24 28   GLUCOSE 138* 109* 116*  BUN 11 12 18   CREATININE 0.58 0.54 0.65  CALCIUM 8.9 8.7 9.8  MG -- -- --  PHOS -- -- --   Liver Function Tests:  Lab 03/04/11 0535  AST 18  ALT 25  ALKPHOS 72  BILITOT 0.8  PROT 5.5*  ALBUMIN 2.6*   No results found for this basename: LIPASE:5,AMYLASE:5 in the last 168 hours No results found for this basename: AMMONIA:5 in the last 168 hours CBC:  Lab 03/05/11 0415 03/03/11 1435  WBC 10.3 12.5*  NEUTROABS -- 8.8*  HGB 11.5* 13.3  HCT 35.8* 39.9  MCV 89.3 88.7  PLT 143* 171   Cardiac Enzymes: No results found for this basename: CKTOTAL:5,CKMB:5,CKMBINDEX:5,TROPONINI:5 in the last 168 hours BNP: No results found for this basename: POCBNP:5 in the last 168 hours CBG:  Lab 03/06/11 1209 03/06/11 0824 03/05/11 2203 03/05/11 1558 03/05/11 1142  GLUCAP 154* 151* 149* 95 159*    Recent Results (from the past 240 hour(s))  CULTURE, BLOOD (SINGLE)     Status: Normal (Preliminary result)   Collection Time   03/03/11  2:35 PM      Component Value Range Status Comment   Specimen Description BLOOD RIGHT ARM   Final    Special Requests BOTTLES DRAWN AEROBIC ONLY 5CC   Final    Setup Time [id number]  Final    Culture     Final    Value:        BLOOD CULTURE RECEIVED NO GROWTH TO DATE CULTURE WILL BE HELD FOR 5 DAYS BEFORE ISSUING A FINAL NEGATIVE REPORT   Report Status PENDING   Incomplete   WOUND CULTURE     Status: Normal   Collection Time   03/03/11  7:19 PM      Component Value Range Status Comment   Specimen Description VULVA RIGHT LABIA   Final    Special Requests NONE   Final    Gram Stain     Final    Value: MODERATE WBC PRESENT, PREDOMINANTLY PMN     NO SQUAMOUS EPITHELIAL CELLS SEEN     MODERATE GRAM POSITIVE COCCI     IN CLUSTERS IN PAIRS   Culture     Final    Value: MODERATE METHICILLIN RESISTANT STAPHYLOCOCCUS  AUREUS     Note: RIFAMPIN AND GENTAMICIN SHOULD NOT BE USED AS SINGLE DRUGS FOR TREATMENT OF STAPH INFECTIONS. This organism DOES NOT demonstrate inducible Clindamycin resistance in vitro. CRITICAL RESULT CALLED TO, READ BACK BY AND VERIFIED WITH: CAROLINE O      03/06/11 AT 810 AM BY [facility]   Report Status 03/06/2011 FINAL   Final    Organism ID, Bacteria METHICILLIN RESISTANT STAPHYLOCOCCUS AUREUS   Final   MRSA PCR SCREENING     Status: Abnormal   Collection Time   03/04/11  7:15 PM      Component Value Range Status Comment   MRSA by PCR POSITIVE (*) NEGATIVE  Final     Studies/Results: No results found.  Medications: Scheduled Meds:   . aspirin EC  81 mg Oral Daily  . B-complex with vitamin C  1 tablet Oral Daily  . calcium carbonate  1 tablet Oral Daily  . chlorhexidine   Topical Daily  . ciprofloxacin  400 mg Intravenous Q12H  . darifenacin  7.5 mg Oral Daily  . diltiazem  120 mg Oral Daily  . docusate sodium  100 mg Oral BID  . enoxaparin  40 mg Subcutaneous Q24H  . FLUoxetine  10 mg Oral Daily  . glipiZIDE  5 mg Oral QAC breakfast  . haloperidol lactate  5 mg Intravenous Once  .  HYDROmorphone (DILAUDID) injection  0.5 mg Intravenous Once  . insulin aspart  0-5 Units Subcutaneous QHS  . insulin aspart  0-9 Units Subcutaneous TID WC  . LORazepam  1 mg Intravenous Once  . metFORMIN  500 mg Oral BID WC  . multivitamins ther. w/minerals  1 tablet Oral Daily  . mupirocin ointment   Nasal BID  . naproxen  250 mg Oral BID WC  . niacin  1,000 mg Oral QHS  . omega-3 acid ethyl esters  1 g Oral BID  . potassium chloride SA  20 mEq Oral Daily  . propranolol  60 mg Oral Daily  . vancomycin  1,000 mg Intravenous Q12H   Continuous Infusions:   . sodium chloride 75 mL/hr at 03/06/11 0039   PRN Meds:.acetaminophen, acetaminophen, acetaminophen, alum & mag hydroxide-simeth, haloperidol lactate, ketorolac, ondansetron (ZOFRAN) IV, ondansetron, senna, DISCONTD:  HYDROcodone-acetaminophen  Assessment/Plan:  Principal Problem:  *Cellulitis and abscess Patient's soft tissue infection is secondary to MRSA. She is currently on day #4 of vancomycin and Cipro. Will discontinue Cipro. Active Problems:  Diabetes mellitus type 2 in obese CBGs 95-159. Good control.  Fall Followup with PT/OT recommendations.  UTI (lower urinary tract  infection)  Cultures do not appear to have been sent before initiation of antibiotics. The patient is status post 3 days of therapy with Cipro. We'll discontinue antibiotics at this time with Cipro. AMS May have been delirium versus a medication side effect. All narcotics have been discontinued. We'll continue to monitor. Disposition: Patient may need short-term skilled nursing home placement for rehabilitation.    LOS: 3 days   Hillery Aldo, MD Pager [phone]  03/06/2011, 2:15 PM

## 2011-03-06 NOTE — Progress Notes (Signed)
Agree with last note from Mr. Marlyne Beards.  See Op Note for left thigh I & D.

## 2011-03-06 NOTE — Progress Notes (Signed)
ANTIBIOTIC CONSULT NOTE - FOLLOW UP  Pharmacy Consult for Vancomycin Indication: Multiple abscesses and spreading cellulitis  Allergies  Allergen Reactions  . Codeine Nausea Only and Other (See Comments)    In addition, most pain medications cause confusion, make patient light headed.    Patient Measurements: Height: 5\' 2"  (157.5 cm) Weight: 200 lb (90.719 kg) IBW/kg (Calculated) : 50.1    Vital Signs: Temp: 98.4 F (36.9 C) (11/20 2121) Temp src: Oral (11/20 2121) BP: 134/75 mmHg (11/20 2121) Pulse Rate: 67  (11/20 2121) Intake/Output from previous day: 11/19 0701 - 11/20 0700 In: 4242.5 [P.O.:1140; I.V.:2502.5; IV Piggyback:600] Out: 525 [Urine:525]  Labs:  [facility] 03/05/11 0415 03/04/11 0535  WBC 10.3 --  HGB 11.5* --  PLT 143* --  LABCREA -- --  CREATININE 0.58 0.54   Estimated Creatinine Clearance: 56.7 ml/min (by C-G formula based on Cr of 0.58).  Microbiology: MRSA by PCR Screening = positive Wound culture vulva/right labia = moderate Staph aureus Blood and urine cultures = pending  Antibiotics: Day #4 Vancomycin 1000mg  IV q12h Cipro d/c'ed today   Assessment: Wound culture with Staph aureus Vancomycin trough level = 17.7 mcg/ml S/P I&D wound abscess today  Goal of Therapy:  Vancomycin trough levels 15-20 mcg/ml Eradication of infection  Plan:  1. Continue current Vancomycin dose.   Terrilee Files Trefz   03/06/2011,10:24 PM

## 2011-03-06 NOTE — Progress Notes (Signed)
Subjective: She got extremely confused last PM, and required haldol.  Objective: Vital signs in last 24 hours: Temp:  [97.8 F (36.6 C)-98.2 F (36.8 C)] 98.2 F (36.8 C) (11/20 0530) Pulse Rate:  [62-88] 73  (11/20 0530) Resp:  [18-20] 18  (11/20 0530) BP: (114-145)/(63-80) 114/63 mmHg (11/20 0530) SpO2:  [81 %-96 %] 92 % (11/20 0530) Last BM Date: 03/02/11  Intake/Output from previous day: 11/19 0701 - 11/20 0700 In: 4242.5 [P.O.:1140; I.V.:2502.5; IV Piggyback:600] Out: 525 [Urine:525] Intake/Output this shift:    General appearance: alert, no distress and a bit slow this AM . The abscess on L arms has opended and drained some.  I don't feel any fluctulance there.  The one on her L thigh has not drained, and fluid is stiil felt below the skin/ .  Lab Results:   Basename 03/05/11 0415 03/03/11 1435  WBC 10.3 12.5*  HGB 11.5* 13.3  HCT 35.8* 39.9  PLT 143* 171    BMET  Basename 03/05/11 0415 03/04/11 0535  NA 142 139  K 3.8 3.8  CL 110 106  CO2 23 24  GLUCOSE 138* 109*  BUN 11 12  CREATININE 0.58 0.54  CALCIUM 8.9 8.7   PT/INR No results found for this basename: LABPROT:2,INR:2 in the last 72 hours   Studies/Results: No results found.  Anti-infectives: Anti-infectives     Start     Dose/Rate Route Frequency Ordered Stop   03/03/11 2200   vancomycin (VANCOCIN) IVPB 1000 mg/200 mL premix        1,000 mg 200 mL/hr over 60 Minutes Intravenous Every 12 hours 03/03/11 2051     03/03/11 2200   ciprofloxacin (CIPRO) IVPB 400 mg        400 mg 200 mL/hr over 60 Minutes Intravenous Every 12 hours 03/03/11 2051     03/03/11 1630   clindamycin (CLEOCIN) IVPB 900 mg        900 mg 100 mL/hr over 30 Minutes Intravenous To Emergency Dept 03/03/11 1555 03/03/11 1751         Current Facility-Administered Medications  Medication Dose Route Frequency Provider Last Rate Last Dose  . 0.9 %  sodium chloride infusion   Intravenous Continuous Carlota Raspberry, MD 75  mL/hr at 03/06/11 0039    . acetaminophen (TYLENOL) tablet 650 mg  650 mg Oral Q6H PRN Arne Cleveland, MD       Or  . acetaminophen (TYLENOL) suppository 650 mg  650 mg Rectal Q6H PRN Arne Cleveland, MD      . acetaminophen (TYLENOL) tablet 650 mg  650 mg Oral Q6H PRN Arne Cleveland, MD      . alum & mag hydroxide-simeth (MAALOX/MYLANTA) 200-200-20 MG/5ML suspension 30 mL  30 mL Oral Q6H PRN Arne Cleveland, MD      . aspirin EC tablet 81 mg  81 mg Oral Daily Arne Cleveland, MD   81 mg at 03/05/11 0900  . B-complex with vitamin C tablet 1 tablet  1 tablet Oral Daily Arne Cleveland, MD   1 tablet at 03/05/11 0900  . calcium carbonate (TUMS - dosed in mg elemental calcium) chewable tablet 200 mg of elemental calcium  1 tablet Oral Daily Arne Cleveland, MD   200 mg of elemental calcium at 03/05/11 0900  . chlorhexidine (HIBICLENS) 4 % liquid   Topical Daily Christina Rama      . ciprofloxacin (CIPRO) IVPB 400 mg  400 mg Intravenous Q12H Arne Cleveland, MD   400 mg at 03/05/11 2248  .  darifenacin (ENABLEX) 24 hr tablet 7.5 mg  7.5 mg Oral Daily Arne Cleveland, MD   7.5 mg at 03/05/11 0900  . diltiazem (CARDIZEM CD) 24 hr capsule 120 mg  120 mg Oral Daily Arne Cleveland, MD   120 mg at 03/05/11 0900  . docusate sodium (COLACE) capsule 100 mg  100 mg Oral BID Arne Cleveland, MD   100 mg at 03/05/11 2302  . enoxaparin (LOVENOX) injection 40 mg  40 mg Subcutaneous Q24H Arne Cleveland, MD   40 mg at 03/05/11 2311  . FLUoxetine (PROZAC) capsule 10 mg  10 mg Oral Daily Arne Cleveland, MD   10 mg at 03/05/11 0900  . glipiZIDE (GLUCOTROL XL) 24 hr tablet 5 mg  5 mg Oral QAC breakfast Arne Cleveland, MD   5 mg at 03/05/11 0900  . haloperidol lactate (HALDOL) injection 2 mg  2 mg Intravenous Q6H PRN Srinika A. Lynch   2 mg at 03/05/11 2107  . haloperidol lactate (HALDOL) injection 5 mg  5 mg Intravenous Once Carlota Raspberry, MD   5 mg at 03/05/11 2324  . HYDROcodone-acetaminophen (NORCO) 5-325 MG per tablet 1-2 tablet  1-2 tablet Oral Q4H PRN  Arne Cleveland, MD      . HYDROmorphone (DILAUDID) injection 0.5 mg  0.5 mg Intravenous Once Gerhard Munch, MD      . insulin aspart (novoLOG) injection 0-5 Units  0-5 Units Subcutaneous QHS Arne Cleveland, MD      . insulin aspart (novoLOG) injection 0-9 Units  0-9 Units Subcutaneous TID WC Arne Cleveland, MD   2 Units at 03/05/11 1300  . metFORMIN (GLUCOPHAGE) tablet 500 mg  500 mg Oral BID WC Arne Cleveland, MD   500 mg at 03/05/11 1643  . multivitamins ther. w/minerals tablet 1 tablet  1 tablet Oral Daily Arne Cleveland, MD      . mupirocin ointment (BACTROBAN) 2 %   Nasal BID Christina Rama      . naproxen (NAPROSYN) tablet 250 mg  250 mg Oral BID WC Arne Cleveland, MD   250 mg at 03/05/11 1643  . niacin tablet 1,000 mg  1,000 mg Oral QHS Arne Cleveland, MD   1,000 mg at 03/05/11 2327  . omega-3 acid ethyl esters (LOVAZA) capsule 1 g  1 g Oral BID Arne Cleveland, MD   1 g at 03/05/11 2325  . ondansetron (ZOFRAN) tablet 4 mg  4 mg Oral Q6H PRN Arne Cleveland, MD       Or  . ondansetron [facility]) injection 4 mg  4 mg Intravenous Q6H PRN Arne Cleveland, MD   4 mg at 03/05/11 0935  . potassium chloride SA (K-DUR,KLOR-CON) CR tablet 20 mEq  20 mEq Oral Daily Arne Cleveland, MD   20 mEq at 03/05/11 0900  . propranolol (INDERAL) tablet 60 mg  60 mg Oral Daily Arne Cleveland, MD   60 mg at 03/05/11 0900  . senna (SENOKOT) tablet 17.2 mg  2 tablet Oral Daily PRN Arne Cleveland, MD      . vancomycin (VANCOCIN) IVPB 1000 mg/200 mL premix  1,000 mg Intravenous Q12H Arne Cleveland, MD   1,000 mg at 03/05/11 2325    Assessment/Plan Multiple abscess, L arm, L thigh, labia Patient Active Problem List  Diagnoses  . History of breast cancer in female  . Diabetes mellitus type 2 in obese  . Hypertension associated with diabetes  . Hypertension associated with diabetes  . Uncomplicated senile dementia  . Osteoarthritis (arthritis due to wear and tear of  joints)  . Lumbar disc disease  . Cellulitis and abscess  . Fall  . UTI  (lower urinary tract infection)   Plan:  Will continue heat for now, and come back later, probable I&D L thigh at bedside.    LOS: 3 days    Sara [name] 03/06/2011

## 2011-03-07 LAB — BASIC METABOLIC PANEL
BUN: 23 mg/dL (ref 6–23)
Creatinine, Ser: 1.37 mg/dL — ABNORMAL HIGH (ref 0.50–1.10)
GFR calc non Af Amer: 35 mL/min — ABNORMAL LOW (ref >90)
Glucose, Bld: 57 mg/dL — ABNORMAL LOW (ref 70–99)
Potassium: 5.1 mEq/L (ref 3.5–5.1)

## 2011-03-07 LAB — GLUCOSE, CAPILLARY
Glucose-Capillary: 149 mg/dL — ABNORMAL HIGH (ref 70–99)
Glucose-Capillary: 176 mg/dL — ABNORMAL HIGH (ref 70–99)
Glucose-Capillary: 145 mg/dL — ABNORMAL HIGH (ref 70–99)

## 2011-03-07 LAB — CBC
HCT: 35.9 % — ABNORMAL LOW (ref 36.0–46.0)
Hemoglobin: 11.8 g/dL — ABNORMAL LOW (ref 12.0–15.0)
MCH: 29.5 pg (ref 26.0–34.0)
MCHC: 32.9 g/dL (ref 30.0–36.0)

## 2011-03-07 MED ORDER — VANCOMYCIN HCL 1000 MG IV SOLR
750.0000 mg | Freq: Two times a day (BID) | INTRAVENOUS | Status: DC
  Administered 2011-03-07 – 2011-03-12 (×10): 750 mg via INTRAVENOUS
  Filled 2011-03-07 (×12): qty 750

## 2011-03-07 NOTE — Progress Notes (Signed)
Interval History:  Sara [name] is an 77 year old female who was admitted with weakness and skin lesions. She was diagnosed with cellulitis involving her left forearm status post fall, a left thigh abscess, and also an abscess on the right labial fold. Culture was taken of the right labial wound. She was put on empiric Cipro and vancomycin by the admitting M.D. wound cultures are growing MRSA. The surgical consultants did a bedside I and D. of her left inner thigh abscess yesterday.   Subjective: Sara [name] is concerned about her urinary incontinence which she states has been going on for several months. She's been incontinent of urine several times a day. Presently, the bed linen is wet from incontinence urine. Otherwise she has no complaints. She is alert and oriented x3. Objective: Filed Vitals:   03/07/11 0542 03/07/11 0949 03/07/11 1020 03/07/11 1442  BP: 143/70 127/87 143/70 119/64  Pulse: 72 78 72 66  Temp: 98.6 F (37 C)   98.6 F (37 C)  TempSrc: Oral   Oral  Resp: 18   18  Height:      Weight:      SpO2: 92% 96%  94%    General: Alert, awake, oriented x3, in no acute distress.  HEENT: [place]/AT PEERL, EOMI Neck: Trachea midline,  no masses, no thyromegal,y no JVD, no carotid bruit OROPHARYNX:  Moist, No exudate/ erythema/lesions.  Heart: Regular rate and rhythm, without murmurs, rubs, gallops, PMI non-displaced, no heaves or thrills on palpation.  Lungs: Clear to auscultation, no wheezing or rhonchi noted. Abdomen: Soft, nontender, nondistended, positive bowel sounds, no masses no hepatosplenomegaly noted..  Musculoskeletal: No warm swelling or erythema around joints, no spinal tenderness noted. Skin:Patient has an area on her left lateral forearm, left inner thigh, and right labial fold that are consistent with cellulitis. He has a small pustule on the left lateral forearm, which s/ p I and D and is packed with iodoform. There is a larger area of induration in the left inner  thigh. These areas appear to be consistent with staph infection. There is less intense erythema than there was yesterday.   Lab Results:  [facility] 03/07/11 0345 03/05/11 0415  NA 145 142  K 5.1 3.8  CL 105 110  CO2 24 23  GLUCOSE 57* 138*  BUN 23 11  CREATININE 1.37* 0.58  CALCIUM 9.7 8.9  MG -- --  PHOS -- --   Micro Results: Recent Results (from the past 240 hour(s))  CULTURE, BLOOD (SINGLE)     Status: Normal (Preliminary result)   Collection Time   03/03/11  2:35 PM      Component Value Range Status Comment   Specimen Description BLOOD RIGHT ARM   Final    Special Requests BOTTLES DRAWN AEROBIC ONLY 5CC   Final    Setup Time [id number]   Final    Culture     Final    Value:        BLOOD CULTURE RECEIVED NO GROWTH TO DATE CULTURE WILL BE HELD FOR 5 DAYS BEFORE ISSUING A FINAL NEGATIVE REPORT   Report Status PENDING   Incomplete   WOUND CULTURE     Status: Normal   Collection Time   03/03/11  7:19 PM      Component Value Range Status Comment   Specimen Description VULVA RIGHT LABIA   Final    Special Requests NONE   Final    Gram Stain     Final    Value: MODERATE WBC  PRESENT, PREDOMINANTLY PMN     NO SQUAMOUS EPITHELIAL CELLS SEEN     MODERATE GRAM POSITIVE COCCI     IN CLUSTERS IN PAIRS   Culture     Final    Value: MODERATE METHICILLIN RESISTANT STAPHYLOCOCCUS AUREUS     Note: RIFAMPIN AND GENTAMICIN SHOULD NOT BE USED AS SINGLE DRUGS FOR TREATMENT OF STAPH INFECTIONS. This organism DOES NOT demonstrate inducible Clindamycin resistance in vitro. CRITICAL RESULT CALLED TO, READ BACK BY AND VERIFIED WITH: CAROLINE O      03/06/11 AT 810 AM BY [facility]   Report Status 03/06/2011 FINAL   Final    Organism ID, Bacteria METHICILLIN RESISTANT STAPHYLOCOCCUS AUREUS   Final   MRSA PCR SCREENING     Status: Abnormal   Collection Time   03/04/11  7:15 PM      Component Value Range Status Comment   MRSA by PCR POSITIVE (*) NEGATIVE  Final   WOUND CULTURE     Status: Normal  (Preliminary result)   Collection Time   03/06/11  3:03 PM      Component Value Range Status Comment   Specimen Description LEG   Final    Special Requests NONE   Final    Gram Stain     Final    Value: NO WBC SEEN     NO SQUAMOUS EPITHELIAL CELLS SEEN     MODERATE GRAM POSITIVE COCCI IN PAIRS     IN CLUSTERS   Culture NO GROWTH   Final    Report Status PENDING   Incomplete     Studies/Results: Dg Knee Ap/lat W/sunrise Left  03/03/2011  *RADIOLOGY REPORT*  Clinical Data: Fall, knee swelling  DG KNEE - 3 VIEWS  Comparison: 12/22/2010  Findings: Moderate to severe tricompartmental degenerative change again identified.  Trace suprapatellar fluid noted.  The patella tracks appropriately in the patellofemoral groove.  No fracture or dislocation.  Vascular calcification noted.  IMPRESSION: Moderate to severe tricompartmental degenerative change.  No acute finding.  Original Report Authenticated By: Harrel Lemon, M.D.    Medications: I have reviewed the patient's current medications. Scheduled Meds:   . aspirin EC  81 mg Oral Daily  . B-complex with vitamin C  1 tablet Oral Daily  . calcium carbonate  1 tablet Oral Daily  . chlorhexidine   Topical Daily  . darifenacin  7.5 mg Oral Daily  . diltiazem  120 mg Oral Daily  . docusate sodium  100 mg Oral BID  . enoxaparin  40 mg Subcutaneous Q24H  . FLUoxetine  10 mg Oral Daily  . glipiZIDE  5 mg Oral QAC breakfast  .  HYDROmorphone (DILAUDID) injection  0.5 mg Intravenous Once  . insulin aspart  0-5 Units Subcutaneous QHS  . insulin aspart  0-9 Units Subcutaneous TID WC  . LORazepam  1 mg Intravenous Once  . metFORMIN  500 mg Oral BID WC  . multivitamins ther. w/minerals  1 tablet Oral Daily  . mupirocin ointment   Nasal BID  . naproxen  250 mg Oral BID WC  . niacin  1,000 mg Oral QHS  . omega-3 acid ethyl esters  1 g Oral BID  . potassium chloride SA  20 mEq Oral Daily  . propranolol  60 mg Oral Daily  . vancomycin  750 mg  Intravenous Q12H  . DISCONTD: vancomycin  1,000 mg Intravenous Q12H   Continuous Infusions:   . sodium chloride 1,000 mL (03/07/11 0840)   PRN Meds:.acetaminophen,  acetaminophen, acetaminophen, alum & mag hydroxide-simeth, haloperidol lactate, ketorolac, ondansetron (ZOFRAN) IV, ondansetron, senna Assessment/Plan: Patient Active Hospital Problem List: Cellulitis and abscess (03/03/2011)   Assessment: Culture shows MRSA continue vancomycin until sensitivities available.    Diabetes mellitus type 2 in obese (03/03/2011)   Assessment: CBGs (86-176) good control.    Fall (03/03/2011)   Assessment: PD recommending skilled nursing facility for rehabilitation care     UTI (lower urinary tract infection) (03/03/2011)   Assessment: Patient was treated empirically with ciprofloxacin. Urine cultures were not sent for evaluation.   Altered mental status (03/06/2011)   Assessment: Patient appears cleared her sensorium today.    LOS: 4 days

## 2011-03-07 NOTE — Progress Notes (Signed)
Subjective: She is in the room by herself this AM. She says leg is not hurting her today. Left arm looks better too.  Objective: Vital signs in last 24 hours: Temp:  [98.2 F (36.8 C)-99 F (37.2 C)] 98.6 F (37 C) (11/21 0542) Pulse Rate:  [67-72] 72  (11/21 0542) Resp:  [18-22] 18  (11/21 0542) BP: (109-147)/(70-84) 143/70 mmHg (11/21 0542) SpO2:  [89 %-94 %] 92 % (11/21 0542) Last BM Date: 03/02/11  Intake/Output from previous day: 11/20 0701 - 11/21 0700 In: 2355 [P.O.:420; I.V.:1735; IV Piggyback:200] Out: 2 [Urine:1; Stool:1] Intake/Output this shift:    General appearance: cooperative and no distress Left arm site looks good, cellulits around abscess is better.  L posterior thigh, dressing intact.  Still a Good deal of erythema, around I&D site.  Packing is pretty clean.  Will start dressing changes today.  Lab Results:   [facility] 03/07/11 0345 03/05/11 0415  WBC 7.6 10.3  HGB 11.8* 11.5*  HCT 35.9* 35.8*  PLT 169 143*    BMET  Basename 03/07/11 0345 03/05/11 0415  NA 145 142  K 5.1 3.8  CL 105 110  CO2 24 23  GLUCOSE 57* 138*  BUN 23 11  CREATININE 1.37* 0.58  CALCIUM 9.7 8.9   PT/INR No results found for this basename: LABPROT:2,INR:2 in the last 72 hours   Studies/Results: No results found.  Anti-infectives: Anti-infectives     Start     Dose/Rate Route Frequency Ordered Stop   03/03/11 2200   vancomycin (VANCOCIN) IVPB 1000 mg/200 mL premix        1,000 mg 200 mL/hr over 60 Minutes Intravenous Every 12 hours 03/03/11 2051     03/03/11 2200   ciprofloxacin (CIPRO) IVPB 400 mg  Status:  Discontinued        400 mg 200 mL/hr over 60 Minutes Intravenous Every 12 hours 03/03/11 2051 03/06/11 1419   03/03/11 1630   clindamycin (CLEOCIN) IVPB 900 mg        900 mg 100 mL/hr over 30 Minutes Intravenous To Emergency Dept 03/03/11 1555 03/03/11 1751         Current Facility-Administered Medications  Medication Dose Route Frequency Provider  Last Rate Last Dose  . 0.9 %  sodium chloride infusion   Intravenous Continuous Carlota Raspberry, MD 75 mL/hr at 03/06/11 0039    . acetaminophen (TYLENOL) tablet 650 mg  650 mg Oral Q6H PRN Arne Cleveland, MD       Or  . acetaminophen (TYLENOL) suppository 650 mg  650 mg Rectal Q6H PRN Arne Cleveland, MD      . acetaminophen (TYLENOL) tablet 650 mg  650 mg Oral Q6H PRN Arne Cleveland, MD      . alum & mag hydroxide-simeth (MAALOX/MYLANTA) 200-200-20 MG/5ML suspension 30 mL  30 mL Oral Q6H PRN Arne Cleveland, MD      . aspirin EC tablet 81 mg  81 mg Oral Daily Arne Cleveland, MD   81 mg at 03/06/11 1106  . B-complex with vitamin C tablet 1 tablet  1 tablet Oral Daily Arne Cleveland, MD   1 tablet at 03/06/11 1107  . calcium carbonate (TUMS - dosed in mg elemental calcium) chewable tablet 200 mg of elemental calcium  1 tablet Oral Daily Arne Cleveland, MD   200 mg of elemental calcium at 03/06/11 1108  . chlorhexidine (HIBICLENS) 4 % liquid   Topical Daily Christina Rama      . darifenacin (ENABLEX) 24 hr tablet 7.5 mg  7.5 mg Oral Daily Arne Cleveland, MD   7.5 mg at 03/06/11 1109  . diltiazem (CARDIZEM CD) 24 hr capsule 120 mg  120 mg Oral Daily Arne Cleveland, MD   120 mg at 03/06/11 1110  . docusate sodium (COLACE) capsule 100 mg  100 mg Oral BID Arne Cleveland, MD   100 mg at 03/06/11 2222  . enoxaparin (LOVENOX) injection 40 mg  40 mg Subcutaneous Q24H Arne Cleveland, MD   40 mg at 03/06/11 2221  . FLUoxetine (PROZAC) capsule 10 mg  10 mg Oral Daily Arne Cleveland, MD   10 mg at 03/06/11 1112  . glipiZIDE (GLUCOTROL XL) 24 hr tablet 5 mg  5 mg Oral QAC breakfast Arne Cleveland, MD   5 mg at 03/06/11 0751  . haloperidol lactate (HALDOL) injection 2 mg  2 mg Intravenous Q6H PRN Anacarolina A. Lynch   2 mg at 03/05/11 2107  . HYDROmorphone (DILAUDID) injection 0.5 mg  0.5 mg Intravenous Once Gerhard Munch, MD      . insulin aspart (novoLOG) injection 0-5 Units  0-5 Units Subcutaneous QHS Arne Cleveland, MD      . insulin aspart  (novoLOG) injection 0-9 Units  0-9 Units Subcutaneous TID WC Arne Cleveland, MD   2 Units at 03/06/11 [phone]  . ketorolac (TORADOL) 30 MG/ML injection 30 mg  30 mg Intravenous Q6H PRN Christina Rama      . LORazepam (ATIVAN) injection 1 mg  1 mg Intravenous Once Christina Rama      . metFORMIN (GLUCOPHAGE) tablet 500 mg  500 mg Oral BID WC Arne Cleveland, MD   500 mg at 03/05/11 1643  . multivitamins ther. w/minerals tablet 1 tablet  1 tablet Oral Daily Arne Cleveland, MD   1 tablet at 03/06/11 1112  . mupirocin ointment (BACTROBAN) 2 %   Nasal BID Christina Rama      . naproxen (NAPROSYN) tablet 250 mg  250 mg Oral BID WC Arne Cleveland, MD   250 mg at 03/05/11 1643  . niacin tablet 1,000 mg  1,000 mg Oral QHS Arne Cleveland, MD      . omega-3 acid ethyl esters (LOVAZA) capsule 1 g  1 g Oral BID Arne Cleveland, MD   1 g at 03/06/11 2222  . ondansetron (ZOFRAN) tablet 4 mg  4 mg Oral Q6H PRN Arne Cleveland, MD       Or  . ondansetron [facility]) injection 4 mg  4 mg Intravenous Q6H PRN Arne Cleveland, MD   4 mg at 03/05/11 0935  . potassium chloride SA (K-DUR,KLOR-CON) CR tablet 20 mEq  20 mEq Oral Daily Arne Cleveland, MD   20 mEq at 03/06/11 1113  . propranolol (INDERAL) tablet 60 mg  60 mg Oral Daily Arne Cleveland, MD   60 mg at 03/06/11 1115  . senna (SENOKOT) tablet 17.2 mg  2 tablet Oral Daily PRN Arne Cleveland, MD      . vancomycin (VANCOCIN) IVPB 1000 mg/200 mL premix  1,000 mg Intravenous Q12H Arne Cleveland, MD   1,000 mg at 03/06/11 2220  . DISCONTD: ciprofloxacin (CIPRO) IVPB 400 mg  400 mg Intravenous Q12H Arne Cleveland, MD   400 mg at 03/06/11 1103  . DISCONTD: HYDROcodone-acetaminophen (NORCO) 5-325 MG per tablet 1-2 tablet  1-2 tablet Oral Q4H PRN Arne Cleveland, MD        Assessment/Plan    SP I&D L THIGH, abscess. Creatinine going up.  ON Vancomycin. Pharmacy is following. I have ask the Nursing staff to  hold Toradol, and Vancomycin till seen by DR.Rama, and Pharmacy. I did not change any orders, to avoid  to many folks writing medicine orders. Plan: Bid dressing changes, and pack with Iodoform for now.  LOS: 4 days    JENNINGS,WILLARD 03/07/2011  Left thigh wound softer and less tender. C & S growing gram positive cocci in pairs.  Ernestene Mention 03/07/2011 4:22 PM

## 2011-03-07 NOTE — Progress Notes (Signed)
ANTIBIOTIC CONSULT NOTE - FOLLOW UP  Pharmacy Consult for Vancomycin Indication: Multiple abscesses and spreading cellulitis   Allergies  Allergen Reactions  . Codeine Nausea Only and Other (See Comments)    In addition, most pain medications cause confusion, make patient light headed.    Patient Measurements: Height: 5\' 2"  (157.5 cm) Weight: 200 lb (90.719 kg) IBW/kg (Calculated) : 50.1   Vital Signs: Temp: 98.6 F (37 C) (11/21 0542) Temp src: Oral (11/21 0542) BP: 143/70 mmHg (11/21 0542) Pulse Rate: 72  (11/21 0542) Intake/Output from previous day: 11/20 0701 - 11/21 0700 In: 2355 [P.O.:420; I.V.:1735; IV Piggyback:200] Out: 2 [Urine:1; Stool:1] Intake/Output from this shift:    Labs:  Basename 03/07/11 0345 03/05/11 0415  WBC 7.6 10.3  HGB 11.8* 11.5*  PLT 169 143*  LABCREA -- --  CREATININE 1.37* 0.58   Estimated Creatinine Clearance: 33.1 ml/min (by C-G formula based on Cr of 1.37).  Basename 03/06/11 2115  VANCOTROUGH 17.7  VANCOPEAK --  VANCORANDOM --  GENTTROUGH --  GENTPEAK --  GENTRANDOM --  TOBRATROUGH --  TOBRAPEAK --  TOBRARND --  AMIKACINPEAK --  AMIKACINTROU --  AMIKACIN --     Microbiology: Recent Results (from the past 720 hour(s))  CULTURE, BLOOD (SINGLE)     Status: Normal (Preliminary result)   Collection Time   03/03/11  2:35 PM      Component Value Range Status Comment   Specimen Description BLOOD RIGHT ARM   Final    Special Requests BOTTLES DRAWN AEROBIC ONLY 5CC   Final    Setup Time [id number]   Final    Culture     Final    Value:        BLOOD CULTURE RECEIVED NO GROWTH TO DATE CULTURE WILL BE HELD FOR 5 DAYS BEFORE ISSUING A FINAL NEGATIVE REPORT   Report Status PENDING   Incomplete   WOUND CULTURE     Status: Normal   Collection Time   03/03/11  7:19 PM      Component Value Range Status Comment   Specimen Description VULVA RIGHT LABIA   Final    Special Requests NONE   Final    Gram Stain     Final    Value:  MODERATE WBC PRESENT, PREDOMINANTLY PMN     NO SQUAMOUS EPITHELIAL CELLS SEEN     MODERATE GRAM POSITIVE COCCI     IN CLUSTERS IN PAIRS   Culture     Final    Value: MODERATE METHICILLIN RESISTANT STAPHYLOCOCCUS AUREUS     Note: RIFAMPIN AND GENTAMICIN SHOULD NOT BE USED AS SINGLE DRUGS FOR TREATMENT OF STAPH INFECTIONS. This organism DOES NOT demonstrate inducible Clindamycin resistance in vitro. CRITICAL RESULT CALLED TO, READ BACK BY AND VERIFIED WITH: CAROLINE O      03/06/11 AT 810 AM BY [facility]   Report Status 03/06/2011 FINAL   Final    Organism ID, Bacteria METHICILLIN RESISTANT STAPHYLOCOCCUS AUREUS   Final   MRSA PCR SCREENING     Status: Abnormal   Collection Time   03/04/11  7:15 PM      Component Value Range Status Comment   MRSA by PCR POSITIVE (*) NEGATIVE  Final   WOUND CULTURE     Status: Normal (Preliminary result)   Collection Time   03/06/11  3:03 PM      Component Value Range Status Comment   Specimen Description LEG   Final    Special Requests NONE   Final  Gram Stain     Final    Value: NO WBC SEEN     NO SQUAMOUS EPITHELIAL CELLS SEEN     MODERATE GRAM POSITIVE COCCI IN PAIRS     IN CLUSTERS   Culture NO GROWTH   Final    Report Status PENDING   Incomplete     Anti-infectives     Start     Dose/Rate Route Frequency Ordered Stop   03/03/11 2200   vancomycin (VANCOCIN) IVPB 1000 mg/200 mL premix        1,000 mg 200 mL/hr over 60 Minutes Intravenous Every 12 hours 03/03/11 2051     03/03/11 2200   ciprofloxacin (CIPRO) IVPB 400 mg  Status:  Discontinued        400 mg 200 mL/hr over 60 Minutes Intravenous Every 12 hours 03/03/11 2051 03/06/11 1419   03/03/11 1630   clindamycin (CLEOCIN) IVPB 900 mg        900 mg 100 mL/hr over 30 Minutes Intravenous To Emergency Dept 03/03/11 1555 03/03/11 1751          Assessment:  84 YOF on D4 Vancomycin 1gm q12h for L thigh and R labial fold abscesses s/p I&D and spreading cellulitis of L forearm.   11/17  wound cx of the vulva/R labia with MRSA.  11/20 leg wound cx no growth.    11/17 blood cx pending  Scr 0.58 on 11/19, no Scr 11/20 - vanc trough was 17.7.  Scr bump to 1.4 today.   Afebrile, WBC down   Goal of Therapy:  Vancomycin trough level 15-20 mcg/ml  Plan:   Unsure whether Vanc trough of 17.7 was prior to or after rise in Scr.  Vancomycin most likely not at steady state with new renal function  Will empirically decrease vancomycin to 750 mg iv q12h for now and follow up trough at steady state.  K 5.1 and Scr up to 1.4: Please consider holding daily PO KCl, metformin, naproxen and 1/2 the dose of toradol.   Geoffry Paradise Thi 03/07/2011,9:09 AM

## 2011-03-07 NOTE — Progress Notes (Signed)
Physical Therapy Evaluation Patient Details Name: Sara [name] MRN: [id number] DOB: December 13, 1925 Today's Date: 03/07/2011 [id number] List:  Patient Active Problem List  Diagnoses  . History of breast cancer in female  . Diabetes mellitus type 2 in obese  . Hypertension associated with diabetes  . Hypertension associated with diabetes  . Uncomplicated senile dementia  . Osteoarthritis (arthritis due to wear and tear of joints)  . Lumbar disc disease  . Cellulitis and abscess  . Fall  . UTI (lower urinary tract infection)  . Altered mental status    Past Medical History:  Past Medical History  Diagnosis Date  . Cancer     breast - left  . Arthritis   . Diabetes mellitus   . Hypertension   . Generalized headaches   . Hearing loss   . Sore throat   . Confusion   . Weakness   . Mental disorder   . DEMENTIA   . Stroke 02-23-2011    recently diagnosed with mini-strokes   Past Surgical History:  Past Surgical History  Procedure Date  . Back surgery     lower  . Vericose veins   . Abdominal hysterectomy   . Breast surgery   . Wrist surgery   . Cardiac catheterization   . Fracture surgery     left arm 01/2011  . Mastectomy     LEft breast with lymph node removeal    PT Assessment/Plan/Recommendation PT Assessment Clinical Impression Statement: pt admitted with cellulitis, has had Left thigh sore lanced, pt tolerated well for first time oob.  Pt can beneft from acute PT to improve strength, functional mobility to discharge to either STSNF or home Daughter not present at eval but present in room later and stated plans for pt to DC home with 24/7                                 PT Recommendation/Assessment: Patient will need skilled PT in the acute care venue PT Problem List: Decreased strength;Decreased range of motion;Decreased activity tolerance;Decreased knowledge of use of DME PT Therapy Diagnosis : Difficulty walking;Generalized weakness PT Plan PT  Frequency: Min 3X/week PT Treatment/Interventions: DME instruction;Gait training;Stair training;Functional mobility training;Patient/family education PT Recommendation Recommendations for Other Services: OT consult Follow Up Recommendations: Skilled nursing facility (unlessm family can provide 24/7 care) Equipment Recommended: None recommended by OT PT Goals  Acute Rehab PT Goals PT Goal Formulation: With patient Time For Goal Achievement: 2 weeks Pt will go Supine/Side to Sit: with supervision;with HOB 0 degrees PT Goal: Supine/Side to Sit - Progress: Progressing toward goal Pt will go Sit to Supine/Side: with supervision PT Goal: Sit to Supine/Side - Progress: Progressing toward goal Pt will Transfer Sit to Stand/Stand to Sit: with supervision PT Transfer Goal: Sit to Stand/Stand to Sit - Progress: Progressing toward goal Pt will Ambulate: 51 - 150 feet PT Goal: Ambulate - Progress: Progressing toward goal Pt will Go Up / Down Stairs: 3-5 stairs;with min assist;with least restrictive assistive device PT Goal: Up/Down Stairs - Progress: Progressing toward goal  PT Evaluation Precautions/Restrictions  Precautions Precautions: Fall Prior Functioning  Home Living Lives With: Alone Receives Help From: Family Type of Home: House Home Layout: One level Home Access: Stairs to enter Entrance Stairs-Rails: Right Entrance Stairs-Number of Steps: 5-6 Bathroom Shower/Tub: Walk-in shower (Pt sponge bathes and does not use shower.) Bathroom Toilet: Standard Home Adaptive Equipment: Grab bars in shower;Shower chair  with back;Walker - rolling;Quad cane;Straight cane;Bedside commode/3-in-1 Additional Comments: Pt has built in shower seat. Prior Function Level of Independence: Requires assistive device for independence;Needs assistance with homemaking Meal Prep: Supervision/set-up (Other family friends would bring food as well.) Driving: Yes (She basically just drove to church.) Vocation:  Retired Financial risk analyst Arousal/Alertness: Awake/alert Overall Cognitive Status: Appears within functional limits for tasks assessed Orientation Level: Oriented to place;Oriented to person;Oriented to situation Sensation/Coordination Sensation Light Touch: Appears Intact Extremity Assessment RLE Assessment RLE Assessment: Within Functional Limits (for activity) LLE Assessment LLE Assessment: Within Functional Limits (for ambulation) Mobility (including Balance) Bed Mobility Bed Mobility: Yes Supine to Sit: 3: Mod assist;With rails;HOB elevated (Comment degrees) Supine to Sit Details (indicate cue type and reason): HOB 40 Transfers Transfers: Yes Sit to Stand: 4: Min assist;With upper extremity assist;From bed Stand to Sit: 4: Min assist;To chair/3-in-1;With upper extremity assist Stand to Sit Details: vc /Tc to reach back to chair Ambulation/Gait Ambulation/Gait: Yes Ambulation/Gait Assistance: 4: Min assist;3: Mod assist Ambulation/Gait Assistance Details (indicate cue type and reason): had chair follow du to first time OOB since admit Ambulation Distance (Feet): 40 Feet Assistive device: Rolling walker Gait Pattern: Step-through pattern (tends to have RW forward and ahead)    Exercise    End of Session PT - End of Session Activity Tolerance: Patient tolerated treatment well Patient left: in chair;with call bell in reach Nurse Communication: Mobility status for transfers General Behavior During Session: [facility] for tasks performed Cognition: [facility] for tasks performed  Rada Hay 03/07/2011, 4:50 [id number]

## 2011-03-08 LAB — GLUCOSE, CAPILLARY
Glucose-Capillary: 146 mg/dL — ABNORMAL HIGH (ref 70–99)
Glucose-Capillary: 164 mg/dL — ABNORMAL HIGH (ref 70–99)
Glucose-Capillary: 159 mg/dL — ABNORMAL HIGH (ref 70–99)
Glucose-Capillary: 92 mg/dL (ref 70–99)

## 2011-03-08 NOTE — Progress Notes (Signed)
Subjective: Patient without complaints except for urinary incontinence. Objective: Filed Vitals:   03/07/11 1442 03/07/11 2035 03/08/11 0517 03/08/11 1400  BP: 119/64 134/80 123/73 122/61  Pulse: 66 62 77 72  Temp: 98.6 F (37 C) 99.2 F (37.3 C) 98.2 F (36.8 C) 98 F (36.7 C)  TempSrc: Oral Oral Oral Oral  Resp: 18 18 16 16   Height:      Weight:      SpO2: 94% 95% 95% 95%   Weight change:   Intake/Output Summary (Last 24 hours) at 03/08/11 1752 Last data filed at 03/08/11 1600  Gross per 24 hour  Intake 2932.75 ml  Output      0 ml  Net 2932.75 ml    Temp:  [98 F (36.7 C)-99.2 F (37.3 C)] 98 F (36.7 C) (11/22 1400) Pulse Rate:  [62-77] 72  (11/22 1400) Resp:  [16-18] 16  (11/22 1400) BP: (122-134)/(61-80) 122/61 mmHg (11/22 1400) SpO2:  [95 %] 95 % (11/22 1400)  General: Alert, awake, oriented x3, in no acute distress.  HEENT: Dora/AT PEERL, EOMI  Neck: Trachea midline, no masses, no thyromegal,y no JVD, no carotid bruit  OROPHARYNX: Moist, No exudate/ erythema/lesions.  Heart: Regular rate and rhythm, without murmurs, rubs, gallops, PMI non-displaced, no heaves or thrills on palpation.  Lungs: Clear to auscultation, no wheezing or rhonchi noted.  Abdomen: Soft, nontender, nondistended, positive bowel sounds, no masses no hepatosplenomegaly noted..  Musculoskeletal: No warm swelling or erythema around joints, no spinal tenderness noted.  Skin:Patient has an area on her left lateral forearm, left inner thigh, and right labial fold that are consistent with cellulitis. He has a small pustule on the left lateral forearm, which s/ p I and D and is packed with iodoform. There is a larger area of induration in the left inner thigh. These areas appear to be consistent with staph infection. There is less intense erythema than there was yesterday.       Lab Results:  [facility] 03/07/11 0345  NA 145  K 5.1  CL 105  CO2 24  GLUCOSE 57*  BUN 23  CREATININE 1.37*    CALCIUM 9.7  MG --  PHOS --    Micro Results: Recent Results (from the past 240 hour(s))  CULTURE, BLOOD (SINGLE)     Status: Normal (Preliminary result)   Collection Time   03/03/11  2:35 PM      Component Value Range Status Comment   Specimen Description BLOOD RIGHT ARM   Final    Special Requests BOTTLES DRAWN AEROBIC ONLY 5CC   Final    Setup Time [id number]   Final    Culture     Final    Value:        BLOOD CULTURE RECEIVED NO GROWTH TO DATE CULTURE WILL BE HELD FOR 5 DAYS BEFORE ISSUING A FINAL NEGATIVE REPORT   Report Status PENDING   Incomplete   WOUND CULTURE     Status: Normal   Collection Time   03/03/11  7:19 PM      Component Value Range Status Comment   Specimen Description VULVA RIGHT LABIA   Final    Special Requests NONE   Final    Gram Stain     Final    Value: MODERATE WBC PRESENT, PREDOMINANTLY PMN     NO SQUAMOUS EPITHELIAL CELLS SEEN     MODERATE GRAM POSITIVE COCCI     IN CLUSTERS IN PAIRS   Culture     Final  Value: MODERATE METHICILLIN RESISTANT STAPHYLOCOCCUS AUREUS     Note: RIFAMPIN AND GENTAMICIN SHOULD NOT BE USED AS SINGLE DRUGS FOR TREATMENT OF STAPH INFECTIONS. This organism DOES NOT demonstrate inducible Clindamycin resistance in vitro. CRITICAL RESULT CALLED TO, READ BACK BY AND VERIFIED WITH: CAROLINE O      03/06/11 AT 810 AM BY [facility]   Report Status 03/06/2011 FINAL   Final    Organism ID, Bacteria METHICILLIN RESISTANT STAPHYLOCOCCUS AUREUS   Final   MRSA PCR SCREENING     Status: Abnormal   Collection Time   03/04/11  7:15 PM      Component Value Range Status Comment   MRSA by PCR POSITIVE (*) NEGATIVE  Final   WOUND CULTURE     Status: Normal (Preliminary result)   Collection Time   03/06/11  3:03 PM      Component Value Range Status Comment   Specimen Description LEG   Final    Special Requests NONE   Final    Gram Stain     Final    Value: NO WBC SEEN     NO SQUAMOUS EPITHELIAL CELLS SEEN     MODERATE GRAM POSITIVE  COCCI IN PAIRS     IN CLUSTERS   Culture     Final    Value: MODERATE STAPHYLOCOCCUS AUREUS     Note: RIFAMPIN AND GENTAMICIN SHOULD NOT BE USED AS SINGLE DRUGS FOR TREATMENT OF STAPH INFECTIONS.   Report Status PENDING   Incomplete     Studies/Results: Dg Knee Ap/lat W/sunrise Left  03/03/2011  *RADIOLOGY REPORT*  Clinical Data: Fall, knee swelling  DG KNEE - 3 VIEWS  Comparison: 12/22/2010  Findings: Moderate to severe tricompartmental degenerative change again identified.  Trace suprapatellar fluid noted.  The patella tracks appropriately in the patellofemoral groove.  No fracture or dislocation.  Vascular calcification noted.  IMPRESSION: Moderate to severe tricompartmental degenerative change.  No acute finding.  Original Report Authenticated By: Harrel Lemon, M.D.    Medications: I have reviewed the patient's current medications. Scheduled Meds:   . aspirin EC  81 mg Oral Daily  . B-complex with vitamin C  1 tablet Oral Daily  . calcium carbonate  1 tablet Oral Daily  . chlorhexidine   Topical Daily  . darifenacin  7.5 mg Oral Daily  . diltiazem  120 mg Oral Daily  . docusate sodium  100 mg Oral BID  . enoxaparin  40 mg Subcutaneous Q24H  . FLUoxetine  10 mg Oral Daily  . glipiZIDE  5 mg Oral QAC breakfast  .  HYDROmorphone (DILAUDID) injection  0.5 mg Intravenous Once  . insulin aspart  0-5 Units Subcutaneous QHS  . insulin aspart  0-9 Units Subcutaneous TID WC  . LORazepam  1 mg Intravenous Once  . metFORMIN  500 mg Oral BID WC  . multivitamins ther. w/minerals  1 tablet Oral Daily  . mupirocin ointment   Nasal BID  . naproxen  250 mg Oral BID WC  . niacin  1,000 mg Oral QHS  . omega-3 acid ethyl esters  1 g Oral BID  . potassium chloride SA  20 mEq Oral Daily  . propranolol  60 mg Oral Daily  . vancomycin  750 mg Intravenous Q12H   Continuous Infusions:   . sodium chloride 75 mL/hr at 03/08/11 1704   PRN Meds:.acetaminophen, acetaminophen, acetaminophen,  alum & mag hydroxide-simeth, haloperidol lactate, ketorolac, ondansetron (ZOFRAN) IV, ondansetron, senna Assessment/Plan: Patient Active Hospital Problem List: Cellulitis  and abscess (03/03/2011)   Assessment: Continue antibiotics. Local care per surgery     Diabetes mellitus type 2 in obese (03/03/2011)   Assessment: Blood sugars well controlled    Fall (03/03/2011)   Assessment: Noted. PT evaluating     UTI (lower urinary tract infection) (03/03/2011)   Assessment: Empirically treated with ciprofloxacin completed.      LOS: 5 days

## 2011-03-08 NOTE — Progress Notes (Signed)
CCS progress note  Assessment/Plan:   1.Cellulitis and abscess (03/03/2011) Left arm is better. Left inner thigh.  2 cm open wound with surrounding cellulitis.  Clean.  Being packed BID.     2. Diabetes mellitus type 2 in obese (03/03/2011)     3.Fall (03/03/2011)   4. UTI (lower urinary tract infection) (03/03/2011)    5. Altered mental status (03/06/2011)  6.  On isolation.      LOS: 5 days   Subjective:  No specific complaint.  She has some trouble with incontinence.  Objective:   Filed Vitals:   03/08/11 0517  BP: 123/73  Pulse: 77  Temp: 98.2 F (36.8 C)  Resp: 16     Intake/Output from previous day:  11/21 0701 - 11/22 0700 In: 2151.3 [I.V.:1851.3; IV Piggyback:300] Out: -   Intake/Output this shift:      Physical Exam:   General: WN WF, mildly obese, who is alert and generally healthy appearing.    Left arm:  Area of cellulitis better.    Left leg:  2 cm open wound in inner thigh.  Some surrounding cellulitis.   Lab Results:    [facility] 03/07/11 0345  WBC 7.6  HGB 11.8*  HCT 35.9*  PLT 169    BMET   Basename 03/07/11 0345  NA 145  K 5.1  CL 105  CO2 24  GLUCOSE 57*  BUN 23  CREATININE 1.37*  CALCIUM 9.7    PT/INR  No results found for this basename: LABPROT:2,INR:2 in the last 72 hours  ABG  No results found for this basename: PHART:2,PCO2:2,PO2:2,HCO3:2 in the last 72 hours   Studies/Results:  No results found.   Anti-infectives:   Anti-infectives     Start     Dose/Rate Route Frequency Ordered Stop   03/07/11 1000   vancomycin (VANCOCIN) 750 mg in sodium chloride 0.9 % 150 mL IVPB        750 mg 150 mL/hr over 60 Minutes Intravenous Every 12 hours 03/07/11 0930     03/03/11 2200   vancomycin (VANCOCIN) IVPB 1000 mg/200 mL premix  Status:  Discontinued        1,000 mg 200 mL/hr over 60 Minutes Intravenous Every 12 hours 03/03/11 2051 03/07/11 0930   03/03/11 2200   ciprofloxacin (CIPRO) IVPB 400 mg  Status:  Discontinued          400 mg 200 mL/hr over 60 Minutes Intravenous Every 12 hours 03/03/11 2051 03/06/11 1419   03/03/11 1630   clindamycin (CLEOCIN) IVPB 900 mg        900 mg 100 mL/hr over 30 Minutes Intravenous To Emergency Dept 03/03/11 1555 03/03/11 1751           Ovidio Kin, MD, FACS Pager: [phone],   [facility] Office: [phone] 03/08/2011

## 2011-03-08 NOTE — Plan of Care (Signed)
Problem: Phase I Progression Outcomes Goal: OOB as tolerated unless otherwise ordered Outcome: Completed/Met Date Met:  03/08/11 Ambulate to BR with assistance.

## 2011-03-09 DIAGNOSIS — A4902 Methicillin resistant Staphylococcus aureus infection, unspecified site: Secondary | ICD-10-CM | POA: Diagnosis present

## 2011-03-09 LAB — WOUND CULTURE: Gram Stain: NONE SEEN

## 2011-03-09 LAB — BASIC METABOLIC PANEL
CO2: 24 mEq/L (ref 19–32)
Calcium: 9.4 mg/dL (ref 8.4–10.5)
Creatinine, Ser: 0.52 mg/dL (ref 0.50–1.10)
GFR calc Af Amer: >90 mL/min (ref >90)
GFR calc non Af Amer: 87 mL/min — ABNORMAL LOW (ref >90)
Sodium: 138 mEq/L (ref 135–145)

## 2011-03-09 LAB — GLUCOSE, CAPILLARY: Glucose-Capillary: 89 mg/dL (ref 70–99)

## 2011-03-09 LAB — VANCOMYCIN, TROUGH: Vancomycin Tr: 14.6 ug/mL (ref 10.0–20.0)

## 2011-03-09 NOTE — Progress Notes (Signed)
ANTIBIOTIC CONSULT NOTE - FOLLOW UP  Pharmacy Consult for Vancomycin Indication: Multiple abscesses and spreading cellulitis   Allergies  Allergen Reactions  . Codeine Nausea Only and Other (See Comments)    In addition, most pain medications cause confusion, make patient light headed.    Patient Measurements: Height: 5\' 2"  (157.5 cm) Weight: 200 lb (90.719 kg) IBW/kg (Calculated) : 50.1   Vital Signs:   Intake/Output from previous day: 11/22 0701 - 11/23 0700 In: 2281.5 [P.O.:600; I.V.:1377.5; IV Piggyback:304] Out: 151 [Urine:150; Stool:1] Intake/Output from this shift: Total I/O In: 120 [P.O.:120] Out: -   Labs:  Basename 03/09/11 1003 03/07/11 0345  WBC -- 7.6  HGB -- 11.8*  PLT -- 169  LABCREA -- --  CREATININE 0.52 1.37*   Estimated Creatinine Clearance: 56.7 ml/min (by C-G formula based on Cr of 0.52).  Basename 03/09/11 1003 03/06/11 2115  VANCOTROUGH 14.6 17.7  VANCOPEAK -- --  VANCORANDOM -- --  GENTTROUGH -- --  GENTPEAK -- --  GENTRANDOM -- --  TOBRATROUGH -- --  TOBRAPEAK -- --  TOBRARND -- --  AMIKACINPEAK -- --  AMIKACINTROU -- --  AMIKACIN -- --     Microbiology: Recent Results (from the past 720 hour(s))  CULTURE, BLOOD (SINGLE)     Status: Normal (Preliminary result)   Collection Time   03/03/11  2:35 PM      Component Value Range Status Comment   Specimen Description BLOOD RIGHT ARM   Final    Special Requests BOTTLES DRAWN AEROBIC ONLY 5CC   Final    Setup Time [id number]   Final    Culture     Final    Value:        BLOOD CULTURE RECEIVED NO GROWTH TO DATE CULTURE WILL BE HELD FOR 5 DAYS BEFORE ISSUING A FINAL NEGATIVE REPORT   Report Status PENDING   Incomplete   WOUND CULTURE     Status: Normal   Collection Time   03/03/11  7:19 PM      Component Value Range Status Comment   Specimen Description VULVA RIGHT LABIA   Final    Special Requests NONE   Final    Gram Stain     Final    Value: MODERATE WBC PRESENT,  PREDOMINANTLY PMN     NO SQUAMOUS EPITHELIAL CELLS SEEN     MODERATE GRAM POSITIVE COCCI     IN CLUSTERS IN PAIRS   Culture     Final    Value: MODERATE METHICILLIN RESISTANT STAPHYLOCOCCUS AUREUS     Note: RIFAMPIN AND GENTAMICIN SHOULD NOT BE USED AS SINGLE DRUGS FOR TREATMENT OF STAPH INFECTIONS. This organism DOES NOT demonstrate inducible Clindamycin resistance in vitro. CRITICAL RESULT CALLED TO, READ BACK BY AND VERIFIED WITH: CAROLINE O      03/06/11 AT 810 AM BY [facility]   Report Status 03/06/2011 FINAL   Final    Organism ID, Bacteria METHICILLIN RESISTANT STAPHYLOCOCCUS AUREUS   Final   MRSA PCR SCREENING     Status: Abnormal   Collection Time   03/04/11  7:15 PM      Component Value Range Status Comment   MRSA by PCR POSITIVE (*) NEGATIVE  Final   WOUND CULTURE     Status: Normal   Collection Time   03/06/11  3:03 PM      Component Value Range Status Comment   Specimen Description LEG   Final    Special Requests NONE   Final  Gram Stain     Final    Value: NO WBC SEEN     NO SQUAMOUS EPITHELIAL CELLS SEEN     MODERATE GRAM POSITIVE COCCI IN PAIRS     IN CLUSTERS   Culture     Final    Value: MODERATE METHICILLIN RESISTANT STAPHYLOCOCCUS AUREUS     Note: RIFAMPIN AND GENTAMICIN SHOULD NOT BE USED AS SINGLE DRUGS FOR TREATMENT OF STAPH INFECTIONS. This organism DOES NOT demonstrate inducible Clindamycin resistance in vitro. CRITICAL RESULT CALLED TO, READ BACK BY AND VERIFIED WITH: Marni Griffon      03/09/11 10:50 BY [facility]   Report Status 03/09/2011 FINAL   Final    Organism ID, Bacteria METHICILLIN RESISTANT STAPHYLOCOCCUS AUREUS   Final     Anti-infectives     Start     Dose/Rate Route Frequency Ordered Stop   03/07/11 1000   vancomycin (VANCOCIN) 750 mg in sodium chloride 0.9 % 150 mL IVPB        750 mg 150 mL/hr over 60 Minutes Intravenous Every 12 hours 03/07/11 0930     03/03/11 2200   vancomycin (VANCOCIN) IVPB 1000 mg/200 mL premix  Status:  Discontinued          1,000 mg 200 mL/hr over 60 Minutes Intravenous Every 12 hours 03/03/11 2051 03/07/11 0930   03/03/11 2200   ciprofloxacin (CIPRO) IVPB 400 mg  Status:  Discontinued        400 mg 200 mL/hr over 60 Minutes Intravenous Every 12 hours 03/03/11 2051 03/06/11 1419   03/03/11 1630   clindamycin (CLEOCIN) IVPB 900 mg        900 mg 100 mL/hr over 30 Minutes Intravenous To Emergency Dept 03/03/11 1555 03/03/11 1751          Assessment:  84 YOF on D4 Vancomycin 750 q12h for L thigh and R labial fold abscesses s/p I&D and spreading cellulitis of L forearm.   11/17 wound cx of the vulva/R labia with MRSA.  11/20 leg wound cx with MRSA    11/17 blood cx no growth to date  Scr 0.58 on 11/19, no Scr 11/20 - vanc trough was 17.7.  Scr bump to 1.4 11/21. SCr back to baseline 0.52 mg/dl today 16/10  Afebrile, WBC down   Day 7 Vancomycin/Cipro/Clindamycin IV  Goal of Therapy:  Vancomycin trough level 15-20 mcg/ml  Plan:   Unsure whether Vanc trough of 17.7 was prior to or after rise in Scr.  Vancomycin most likely not at steady state with new renal function  Will continue vancomycin 750 mg iv q12h   K 3.9 and Scr down to 0.52. Renal fx WNL, no changes needed for renally excreted meds.   Mikko Lewellen L 03/09/2011,11:32 AM

## 2011-03-09 NOTE — Progress Notes (Addendum)
Occupational Therapy Treatment Patient Details Name: Sara [name] MRN: [id number] DOB: [birth year] Today's Date: 03/09/2011 1344 1415 2 [place]  OT Assessment/Plan OT Assessment/Plan OT Plan: Discharge plan remains appropriate OT Frequency: Min 2X/week Follow Up Recommendations: Skilled nursing facility Equipment Recommended: Defer to next venue OT Goals ADL Goals ADL Goal: Toilet Transfer - Progress: Progressing toward goals ADL Goal: Toileting - Hygiene - Progress: Progressing toward goals  OT Treatment Precautions/Restrictions  Precautions Precautions: Fall   ADL ADL Grooming: Performed;Brushing hair Grooming Details (indicate cue type and reason): able to reach all of hair with min cues for thoroughness Where Assessed - Grooming: Sitting, chair;Supported Statistician: Mining engineer Details (indicate cue type and reason): mod cues for step length with RW to Data processing manager Method: Ambulating Toileting - Hygiene: Performed;Moderate assistance (pt incontinent x urine; hygiene in standing at sink) Toileting - Hygiene Details (indicate cue type and reason): min safety cues to watch head/face at sink counter Where Assessed - Toileting Hygiene: Standing Mobility  Bed Mobility Bed Mobility: Yes Supine to Sit: 3: Mod assist Supine to Sit Details (indicate cue type and reason): min cues Transfers Transfers: Yes Sit to Stand: 4: Min assist;From bed Exercises    End of Session OT - End of Session Equipment Utilized During Treatment: Gait belt Activity Tolerance: Patient limited by fatigue Patient left: in chair;with call bell in reach;with family/visitor present (daughter present) General Behavior During Session: Impaired Cognition: Impaired (with safety cues)    Sara [name]  [phone] 03/09/2011, 3:49 PM

## 2011-03-09 NOTE — Progress Notes (Signed)
Pt plan to discharge home with family 24/7 and [facility] for any home health needs. MP

## 2011-03-09 NOTE — Progress Notes (Addendum)
Patient referred for SNF placement. Spoke with patient and her daughter Harriett Sine. Patient does not want SNF placement nor does her daughter. Plan is to return home with home health follow-up. Patient lives alone but daughter states that she and other family members can stay with patient. She is also checked on frequently during the day and evening.  She will require DME and choice of home health is [facility] per daughter. They live in [place].  Notified Cookie, RNCM and Dr. Ashley Royalty of d/c plan. Social worker will sign off.  Darylene Price, BSW, 03/09/2011 2:31 PM

## 2011-03-09 NOTE — Progress Notes (Signed)
Patient with limited venous access sites in right arm and left arm is restricted.  Please consider PICC if patient is to continue on IV therapy/antibiotics for much longer.  Thanks. Nickoli Bagheri, Lajean Manes IV team

## 2011-03-09 NOTE — Progress Notes (Signed)
Subjective: Patient is at her baseline functioning. She has no complaints at this time. The overall plan is for the patient to go back to her home with home health services. Objective: Filed Vitals:   03/08/11 0517 03/08/11 1400 03/08/11 2200 03/09/11 1414  BP: 123/73 122/61 146/77 129/69  Pulse: 77 72 59 63  Temp: 98.2 F (36.8 C) 98 F (36.7 C) 98.3 F (36.8 C) 97.9 F (36.6 C)  TempSrc: Oral Oral  Oral  Resp: 16 16 18 16   Height:      Weight:      SpO2: 95% 95% 91% 93%   Weight change:   Intake/Output Summary (Last 24 hours) at 03/09/11 1807 Last data filed at 03/09/11 1700  Gross per 24 hour  Intake 2163.75 ml  Output    151 ml  Net 2012.75 ml   Temp:  [97.9 F (36.6 C)-98.3 F (36.8 C)] 97.9 F (36.6 C) (11/23 1414) Pulse Rate:  [59-63] 63  (11/23 1414) Resp:  [16-18] 16  (11/23 1414) BP: (129-146)/(69-77) 129/69 mmHg (11/23 1414) SpO2:  [91 %-93 %] 93 % (11/23 1414)  General: Alert, awake, oriented x3, in no acute distress.  HEENT: [place]/AT PEERL, EOMI  Neck: Trachea midline, no masses, no thyromegal,y no JVD, no carotid bruit  OROPHARYNX: Moist, No exudate/ erythema/lesions.  Heart: Regular rate and rhythm, without murmurs, rubs, gallops, PMI non-displaced, no heaves or thrills on palpation.  Lungs: Clear to auscultation, no wheezing or rhonchi noted.  Abdomen: Soft, nontender, nondistended, positive bowel sounds, no masses no hepatosplenomegaly noted..  Musculoskeletal: No warm swelling or erythema around joints, no spinal tenderness noted.  Skin:Patient has an area on her left lateral forearm, left inner thigh, and right labial fold that are consistent with cellulitis. He has a small pustule on the left lateral forearm, which s/ p I and D and is packed with iodoform. There is a larger area of induration in the left inner thigh. These areas appear to be consistent with staph infection. There is less intense erythema than there was yesterday.   Lab  Results:  Basename 03/09/11 1003 03/07/11 0345  NA 138 145  K 3.9 5.1  CL 104 105  CO2 24 24  GLUCOSE 175* 57*  BUN 9 23  CREATININE 0.52 1.37*  CALCIUM 9.4 9.7  MG -- --  PHOS -- --   No results found for this basename: AST:2,ALT:2,ALKPHOS:2,BILITOT:2,PROT:2,ALBUMIN:2 in the last 72 hours No results found for this basename: LIPASE:2,AMYLASE:2 in the last 72 hours  Basename 03/07/11 0345  WBC 7.6  NEUTROABS --  HGB 11.8*  HCT 35.9*  MCV 89.8  PLT 169   No results found for this basename: CKTOTAL:3,CKMB:3,CKMBINDEX:3,TROPONINI:3 in the last 72 hours No results found for this basename: POCBNP:3 in the last 72 hours No results found for this basename: DDIMER:2 in the last 72 hours No results found for this basename: HGBA1C:2 in the last 72 hours No results found for this basename: CHOL:2,HDL:2,LDLCALC:2,TRIG:2,CHOLHDL:2,LDLDIRECT:2 in the last 72 hours No results found for this basename: TSH,T4TOTAL,FREET3,T3FREE,THYROIDAB in the last 72 hours No results found for this basename: VITAMINB12:2,FOLATE:2,FERRITIN:2,TIBC:2,IRON:2,RETICCTPCT:2 in the last 72 hours  Micro Results: Recent Results (from the past 240 hour(s))  CULTURE, BLOOD (SINGLE)     Status: Normal (Preliminary result)   Collection Time   03/03/11  2:35 PM      Component Value Range Status Comment   Specimen Description BLOOD RIGHT ARM   Final    Special Requests BOTTLES DRAWN AEROBIC ONLY 5CC  Final    Setup Time [id number]   Final    Culture     Final    Value:        BLOOD CULTURE RECEIVED NO GROWTH TO DATE CULTURE WILL BE HELD FOR 5 DAYS BEFORE ISSUING A FINAL NEGATIVE REPORT   Report Status PENDING   Incomplete   WOUND CULTURE     Status: Normal   Collection Time   03/03/11  7:19 PM      Component Value Range Status Comment   Specimen Description VULVA RIGHT LABIA   Final    Special Requests NONE   Final    Gram Stain     Final    Value: MODERATE WBC PRESENT, PREDOMINANTLY PMN     NO SQUAMOUS  EPITHELIAL CELLS SEEN     MODERATE GRAM POSITIVE COCCI     IN CLUSTERS IN PAIRS   Culture     Final    Value: MODERATE METHICILLIN RESISTANT STAPHYLOCOCCUS AUREUS     Note: RIFAMPIN AND GENTAMICIN SHOULD NOT BE USED AS SINGLE DRUGS FOR TREATMENT OF STAPH INFECTIONS. This organism DOES NOT demonstrate inducible Clindamycin resistance in vitro. CRITICAL RESULT CALLED TO, READ BACK BY AND VERIFIED WITH: CAROLINE O      03/06/11 AT 810 AM BY [facility]   Report Status 03/06/2011 FINAL   Final    Organism ID, Bacteria METHICILLIN RESISTANT STAPHYLOCOCCUS AUREUS   Final   MRSA PCR SCREENING     Status: Abnormal   Collection Time   03/04/11  7:15 PM      Component Value Range Status Comment   MRSA by PCR POSITIVE (*) NEGATIVE  Final   WOUND CULTURE     Status: Normal   Collection Time   03/06/11  3:03 PM      Component Value Range Status Comment   Specimen Description LEG   Final    Special Requests NONE   Final    Gram Stain     Final    Value: NO WBC SEEN     NO SQUAMOUS EPITHELIAL CELLS SEEN     MODERATE GRAM POSITIVE COCCI IN PAIRS     IN CLUSTERS   Culture     Final    Value: MODERATE METHICILLIN RESISTANT STAPHYLOCOCCUS AUREUS     Note: RIFAMPIN AND GENTAMICIN SHOULD NOT BE USED AS SINGLE DRUGS FOR TREATMENT OF STAPH INFECTIONS. This organism DOES NOT demonstrate inducible Clindamycin resistance in vitro. CRITICAL RESULT CALLED TO, READ BACK BY AND VERIFIED WITH: Marni Griffon      03/09/11 10:50 BY [facility]   Report Status 03/09/2011 FINAL   Final    Organism ID, Bacteria METHICILLIN RESISTANT STAPHYLOCOCCUS AUREUS   Final     Studies/Results: Dg Knee Ap/lat W/sunrise Left  03/03/2011  *RADIOLOGY REPORT*  Clinical Data: Fall, knee swelling  DG KNEE - 3 VIEWS  Comparison: 12/22/2010  Findings: Moderate to severe tricompartmental degenerative change again identified.  Trace suprapatellar fluid noted.  The patella tracks appropriately in the patellofemoral groove.  No fracture or  dislocation.  Vascular calcification noted.  IMPRESSION: Moderate to severe tricompartmental degenerative change.  No acute finding.  Original Report Authenticated By: Harrel Lemon, M.D.    Medications: I have reviewed the patient's current medications. Scheduled Meds:   . aspirin EC  81 mg Oral Daily  . B-complex with vitamin C  1 tablet Oral Daily  . calcium carbonate  1 tablet Oral Daily  . chlorhexidine   Topical Daily  .  darifenacin  7.5 mg Oral Daily  . diltiazem  120 mg Oral Daily  . docusate sodium  100 mg Oral BID  . enoxaparin  40 mg Subcutaneous Q24H  . FLUoxetine  10 mg Oral Daily  . glipiZIDE  5 mg Oral QAC breakfast  .  HYDROmorphone (DILAUDID) injection  0.5 mg Intravenous Once  . insulin aspart  0-5 Units Subcutaneous QHS  . insulin aspart  0-9 Units Subcutaneous TID WC  . LORazepam  1 mg Intravenous Once  . metFORMIN  500 mg Oral BID WC  . multivitamins ther. w/minerals  1 tablet Oral Daily  . mupirocin ointment   Nasal BID  . naproxen  250 mg Oral BID WC  . niacin  1,000 mg Oral QHS  . omega-3 acid ethyl esters  1 g Oral BID  . potassium chloride SA  20 mEq Oral Daily  . propranolol  60 mg Oral Daily  . vancomycin  750 mg Intravenous Q12H   Continuous Infusions:   . sodium chloride 75 mL/hr at 03/09/11 1545   PRN Meds:.acetaminophen, acetaminophen, acetaminophen, alum & mag hydroxide-simeth, haloperidol lactate, ketorolac, ondansetron (ZOFRAN) IV, ondansetron, senna Assessment/Plan: Patient Active Hospital Problem List: Cellulitis and abscess (03/03/2011)  cultures grew MRSA in both cultures. The patient is on vancomycin she continued on vancomycin for a total of 10-14 days. She will need a PICC line placed for posthospitalization Plavix. Diabetes mellitus type 2 in obese (03/03/2011)   patient's blood sugars slowly drifting up. We'll continue to monitor and cover with correction dose insulin as necessary.  Fall (03/03/2011)  patient is status post  fall and will go home with physical therapy back to her daughter's home.  UTI (lower urinary tract infection) (03/03/2011)   treated empirically with ciprofloxacin.  Altered mental status (03/06/2011)   Assessment: At baseline.   MRSA (methicillin resistant Staphylococcus aureus) infection (03/09/2011)   Assessment: See above.  Likely discharge home tomorrow.   LOS: 6 days

## 2011-03-09 NOTE — Progress Notes (Signed)
Wound culture right thigh positive mrsa per lab  [employer]

## 2011-03-09 NOTE — Progress Notes (Signed)
Subjective: Leg does not bother her now.  L arm also better.  Objective: Vital signs in last 24 hours: Temp:  [98 F (36.7 C)-98.3 F (36.8 C)] 98.3 F (36.8 C) (11/22 2200) Pulse Rate:  [59-72] 59  (11/22 2200) Resp:  [16-18] 18  (11/22 2200) BP: (122-146)/(61-77) 146/77 mmHg (11/22 2200) SpO2:  [91 %-95 %] 91 % (11/22 2200) Last BM Date: 03/09/11  Intake/Output from previous day: 11/22 0701 - 11/23 0700 In: 2281.5 [P.O.:600; I.V.:1377.5; IV Piggyback:304] Out: 151 [Urine:150; Stool:1] Intake/Output this shift: Total I/O In: 120 [P.O.:120] Out: -   General appearance: alert, cooperative, no distress and oriented today, no confusion. L arm site much better.  L thigh site much improved. Pt. is incontinent, can't hold her urine, and the staff cant't get to her fast enough to get her out of bed.  So site dressing is wet. The site itself is ok, erythema improving well drained and still getting some iodoform with dressing changes..   Lab Results:   Basename 03/07/11 0345  WBC 7.6  HGB 11.8*  HCT 35.9*  PLT 169    BMET  Basename 03/07/11 0345  NA 145  K 5.1  CL 105  CO2 24  GLUCOSE 57*  BUN 23  CREATININE 1.37*  CALCIUM 9.7   PT/INR No results found for this basename: LABPROT:2,INR:2 in the last 72 hours   Studies/Results: No results found.  Anti-infectives: Anti-infectives     Start     Dose/Rate Route Frequency Ordered Stop   03/07/11 1000   vancomycin (VANCOCIN) 750 mg in sodium chloride 0.9 % 150 mL IVPB        750 mg 150 mL/hr over 60 Minutes Intravenous Every 12 hours 03/07/11 0930     03/03/11 2200   vancomycin (VANCOCIN) IVPB 1000 mg/200 mL premix  Status:  Discontinued        1,000 mg 200 mL/hr over 60 Minutes Intravenous Every 12 hours 03/03/11 2051 03/07/11 0930   03/03/11 2200   ciprofloxacin (CIPRO) IVPB 400 mg  Status:  Discontinued        400 mg 200 mL/hr over 60 Minutes Intravenous Every 12 hours 03/03/11 2051 03/06/11 1419   03/03/11 1630   clindamycin (CLEOCIN) IVPB 900 mg        900 mg 100 mL/hr over 30 Minutes Intravenous To Emergency Dept 03/03/11 1555 03/03/11 1751         Current Facility-Administered Medications  Medication Dose Route Frequency Provider Last Rate Last Dose  . 0.9 %  sodium chloride infusion   Intravenous Continuous Carlota Raspberry, MD 75 mL/hr at 03/09/11 0754    . acetaminophen (TYLENOL) tablet 650 mg  650 mg Oral Q6H PRN Arne Cleveland, MD       Or  . acetaminophen (TYLENOL) suppository 650 mg  650 mg Rectal Q6H PRN Arne Cleveland, MD      . acetaminophen (TYLENOL) tablet 650 mg  650 mg Oral Q6H PRN Arne Cleveland, MD      . alum & mag hydroxide-simeth (MAALOX/MYLANTA) 200-200-20 MG/5ML suspension 30 mL  30 mL Oral Q6H PRN Arne Cleveland, MD      . aspirin EC tablet 81 mg  81 mg Oral Daily Arne Cleveland, MD   81 mg at 03/09/11 1035  . B-complex with vitamin C tablet 1 tablet  1 tablet Oral Daily Arne Cleveland, MD   1 tablet at 03/09/11 0945  . calcium carbonate (TUMS - dosed in mg elemental calcium) chewable tablet 200 mg  of elemental calcium  1 tablet Oral Daily Arne Cleveland, MD   200 mg of elemental calcium at 03/09/11 0945  . chlorhexidine (HIBICLENS) 4 % liquid   Topical Daily Christina Rama      . darifenacin (ENABLEX) 24 hr tablet 7.5 mg  7.5 mg Oral Daily Arne Cleveland, MD   7.5 mg at 03/09/11 0945  . diltiazem (CARDIZEM CD) 24 hr capsule 120 mg  120 mg Oral Daily Arne Cleveland, MD   120 mg at 03/09/11 0946  . docusate sodium (COLACE) capsule 100 mg  100 mg Oral BID Arne Cleveland, MD   100 mg at 03/09/11 0944  . enoxaparin (LOVENOX) injection 40 mg  40 mg Subcutaneous Q24H Arne Cleveland, MD   40 mg at 03/08/11 2138  . FLUoxetine (PROZAC) capsule 10 mg  10 mg Oral Daily Arne Cleveland, MD   10 mg at 03/09/11 0944  . glipiZIDE (GLUCOTROL XL) 24 hr tablet 5 mg  5 mg Oral QAC breakfast Arne Cleveland, MD   5 mg at 03/09/11 0945  . haloperidol lactate (HALDOL) injection 2 mg  2 mg Intravenous Q6H PRN Johari  A. Lynch   2 mg at 03/05/11 2107  . HYDROmorphone (DILAUDID) injection 0.5 mg  0.5 mg Intravenous Once Gerhard Munch, MD      . insulin aspart (novoLOG) injection 0-5 Units  0-5 Units Subcutaneous QHS Arne Cleveland, MD      . insulin aspart (novoLOG) injection 0-9 Units  0-9 Units Subcutaneous TID WC Arne Cleveland, MD   2 Units at 03/08/11 1600  . ketorolac (TORADOL) 30 MG/ML injection 30 mg  30 mg Intravenous Q6H PRN Christina Rama      . LORazepam (ATIVAN) injection 1 mg  1 mg Intravenous Once Christina Rama      . metFORMIN (GLUCOPHAGE) tablet 500 mg  500 mg Oral BID WC Arne Cleveland, MD   500 mg at 03/09/11 0944  . multivitamins ther. w/minerals tablet 1 tablet  1 tablet Oral Daily Arne Cleveland, MD      . mupirocin ointment (BACTROBAN) 2 %   Nasal BID Christina Rama      . naproxen (NAPROSYN) tablet 250 mg  250 mg Oral BID WC Arne Cleveland, MD   250 mg at 03/09/11 0945  . niacin tablet 1,000 mg  1,000 mg Oral QHS Arne Cleveland, MD   1,000 mg at 03/08/11 2138  . omega-3 acid ethyl esters (LOVAZA) capsule 1 g  1 g Oral BID Arne Cleveland, MD   1 g at 03/09/11 0944  . ondansetron (ZOFRAN) tablet 4 mg  4 mg Oral Q6H PRN Arne Cleveland, MD       Or  . ondansetron [facility]) injection 4 mg  4 mg Intravenous Q6H PRN Arne Cleveland, MD   4 mg at 03/08/11 1016  . potassium chloride SA (K-DUR,KLOR-CON) CR tablet 20 mEq  20 mEq Oral Daily Arne Cleveland, MD   20 mEq at 03/09/11 0944  . propranolol (INDERAL) tablet 60 mg  60 mg Oral Daily Arne Cleveland, MD   60 mg at 03/09/11 0945  . senna (SENOKOT) tablet 17.2 mg  2 tablet Oral Daily PRN Arne Cleveland, MD      . vancomycin (VANCOCIN) 750 mg in sodium chloride 0.9 % 150 mL IVPB  750 mg Intravenous Q12H Thuyvan Thi Phan, PHARMD   750 mg at 03/09/11 1000    Assessment/Plan   s/p drainage of infected posterior L thigh abscess.  Continues to improve. Resolving abscess of  L arm and labia. Mild rise in Creat, defer to primary service. On Vancomycin. Patient Active  Problem List  Diagnoses  . History of breast cancer in female  . Diabetes mellitus type 2 in obese  . Hypertension associated with diabetes  . Hypertension associated with diabetes  . Uncomplicated senile dementia  . Osteoarthritis (arthritis due to wear and tear of joints)  . Lumbar disc disease  . Cellulitis and abscess  . Fall  . UTI (lower urinary tract infection)  . Altered mental status   Plan; continue dressing changes to L thigh.  LOS: 6 days    Avyan Livesay 03/09/2011

## 2011-03-10 LAB — DIFFERENTIAL
Basophils Absolute: 0 10*3/uL (ref 0.0–0.1)
Basophils Relative: 1 % (ref 0–1)
Lymphocytes Relative: 30 % (ref 12–46)
Monocytes Absolute: 0.6 10*3/uL (ref 0.1–1.0)
Monocytes Relative: 8 % (ref 3–12)
Neutro Abs: 3.6 10*3/uL (ref 1.7–7.7)
Neutrophils Relative %: 54 % (ref 43–77)

## 2011-03-10 LAB — CBC
HCT: 35.3 % — ABNORMAL LOW (ref 36.0–46.0)
Hemoglobin: 11.8 g/dL — ABNORMAL LOW (ref 12.0–15.0)
MCHC: 33.4 g/dL (ref 30.0–36.0)
RBC: 3.99 MIL/uL (ref 3.87–5.11)
WBC: 6.7 10*3/uL (ref 4.0–10.5)

## 2011-03-10 LAB — GLUCOSE, CAPILLARY
Glucose-Capillary: 96 mg/dL (ref 70–99)
Glucose-Capillary: 106 mg/dL — ABNORMAL HIGH (ref 70–99)

## 2011-03-10 LAB — CULTURE, BLOOD (SINGLE): Culture  Setup Time: 201211181120

## 2011-03-10 LAB — CREATININE, SERUM
GFR calc Af Amer: >90 mL/min (ref >90)
GFR calc non Af Amer: 84 mL/min — ABNORMAL LOW (ref >90)

## 2011-03-10 NOTE — Progress Notes (Signed)
Subjective: Is a patient complaints sitting up in the bed. The patient's daughter has inquired about seeing urologist regarding the patient's ongoing urinary incontinence. I informed the patient as well as urology and he recommended the patient be seen by them as an outpatient. Objective: Filed Vitals:   03/09/11 1414 03/09/11 2230 03/10/11 0500 03/10/11 0600  BP: 129/69 159/92  154/81  Pulse: 63 78  64  Temp: 97.9 F (36.6 C) 98.6 F (37 C)  98.1 F (36.7 C)  TempSrc: Oral Oral  Oral  Resp: 16 18  18   Height:      Weight:   88.5 kg (195 lb 1.7 oz)   SpO2: 93% 92%  93%   Weight change:   Intake/Output Summary (Last 24 hours) at 03/10/11 1703 Last data filed at 03/10/11 1300  Gross per 24 hour  Intake    480 ml  Output    302 ml  Net    178 ml   Temp:  [98.1 F (36.7 C)-98.6 F (37 C)] 98.1 F (36.7 C) (11/24 0600) Pulse Rate:  [64-78] 64  (11/24 0600) Resp:  [18] 18  (11/24 0600) BP: (154-159)/(81-92) 154/81 mmHg (11/24 0600) SpO2:  [92 %-93 %] 93 % (11/24 0600) Weight:  [88.5 kg (195 lb 1.7 oz)] 195 lb 1.7 oz (88.5 kg) (11/24 0500)  General: Alert, awake, oriented x3, in no acute distress.  HEENT: [place]/AT PEERL, EOMI  Neck: Trachea midline, no masses, no thyromegal,y no JVD, no carotid bruit  OROPHARYNX: Moist, No exudate/ erythema/lesions.  Heart: Regular rate and rhythm, without murmurs, rubs, gallops, PMI non-displaced, no heaves or thrills on palpation.  Lungs: Clear to auscultation, no wheezing or rhonchi noted.  Abdomen: Soft, nontender, nondistended, positive bowel sounds, no masses no hepatosplenomegaly noted..  Musculoskeletal: No warm swelling or erythema around joints, no spinal tenderness noted.  Skin:Patient has an area on her left lateral forearm, left inner thigh, and right labial fold that are consistent with cellulitis. He has a small pustule on the left lateral forearm, which s/ p I and D and is packed with iodoform. There is a larger area of induration in  the left inner thigh. These areas appear to be consistent with staph infection. There is less intense erythema than there was yesterday Lab Results:  Basename 03/10/11 0430 03/09/11 1003  NA -- 138  K -- 3.9  CL -- 104  CO2 -- 24  GLUCOSE -- 175*  BUN -- 9  CREATININE 0.58 0.52  CALCIUM -- 9.4  MG -- --  PHOS -- --   No results found for this basename: AST:2,ALT:2,ALKPHOS:2,BILITOT:2,PROT:2,ALBUMIN:2 in the last 72 hours No results found for this basename: LIPASE:2,AMYLASE:2 in the last 72 hours  Basename 03/10/11 0430  WBC 6.7  NEUTROABS 3.6  HGB 11.8*  HCT 35.3*  MCV 88.5  PLT 192   No results found for this basename: CKTOTAL:3,CKMB:3,CKMBINDEX:3,TROPONINI:3 in the last 72 hours No results found for this basename: POCBNP:3 in the last 72 hours No results found for this basename: DDIMER:2 in the last 72 hours No results found for this basename: HGBA1C:2 in the last 72 hours No results found for this basename: CHOL:2,HDL:2,LDLCALC:2,TRIG:2,CHOLHDL:2,LDLDIRECT:2 in the last 72 hours No results found for this basename: TSH,T4TOTAL,FREET3,T3FREE,THYROIDAB in the last 72 hours No results found for this basename: VITAMINB12:2,FOLATE:2,FERRITIN:2,TIBC:2,IRON:2,RETICCTPCT:2 in the last 72 hours  Micro Results: Recent Results (from the past 240 hour(s))  CULTURE, BLOOD (SINGLE)     Status: Normal   Collection Time   03/03/11  2:35 PM      Component Value Range Status Comment   Specimen Description BLOOD RIGHT ARM   Final    Special Requests BOTTLES DRAWN AEROBIC ONLY 5CC   Final    Setup Time [id number]   Final    Culture NO GROWTH 5 DAYS   Final    Report Status 03/10/2011 FINAL   Final   WOUND CULTURE     Status: Normal   Collection Time   03/03/11  7:19 PM      Component Value Range Status Comment   Specimen Description VULVA RIGHT LABIA   Final    Special Requests NONE   Final    Gram Stain     Final    Value: MODERATE WBC PRESENT, PREDOMINANTLY PMN     NO SQUAMOUS  EPITHELIAL CELLS SEEN     MODERATE GRAM POSITIVE COCCI     IN CLUSTERS IN PAIRS   Culture     Final    Value: MODERATE METHICILLIN RESISTANT STAPHYLOCOCCUS AUREUS     Note: RIFAMPIN AND GENTAMICIN SHOULD NOT BE USED AS SINGLE DRUGS FOR TREATMENT OF STAPH INFECTIONS. This organism DOES NOT demonstrate inducible Clindamycin resistance in vitro. CRITICAL RESULT CALLED TO, READ BACK BY AND VERIFIED WITH: CAROLINE O      03/06/11 AT 810 AM BY [facility]   Report Status 03/06/2011 FINAL   Final    Organism ID, Bacteria METHICILLIN RESISTANT STAPHYLOCOCCUS AUREUS   Final   MRSA PCR SCREENING     Status: Abnormal   Collection Time   03/04/11  7:15 PM      Component Value Range Status Comment   MRSA by PCR POSITIVE (*) NEGATIVE  Final   WOUND CULTURE     Status: Normal   Collection Time   03/06/11  3:03 PM      Component Value Range Status Comment   Specimen Description LEG   Final    Special Requests NONE   Final    Gram Stain     Final    Value: NO WBC SEEN     NO SQUAMOUS EPITHELIAL CELLS SEEN     MODERATE GRAM POSITIVE COCCI IN PAIRS     IN CLUSTERS   Culture     Final    Value: MODERATE METHICILLIN RESISTANT STAPHYLOCOCCUS AUREUS     Note: RIFAMPIN AND GENTAMICIN SHOULD NOT BE USED AS SINGLE DRUGS FOR TREATMENT OF STAPH INFECTIONS. This organism DOES NOT demonstrate inducible Clindamycin resistance in vitro. CRITICAL RESULT CALLED TO, READ BACK BY AND VERIFIED WITH: Marni Griffon      03/09/11 10:50 BY [facility]   Report Status 03/09/2011 FINAL   Final    Organism ID, Bacteria METHICILLIN RESISTANT STAPHYLOCOCCUS AUREUS   Final     Studies/Results: Dg Knee Ap/lat W/sunrise Left  03/03/2011  *RADIOLOGY REPORT*  Clinical Data: Fall, knee swelling  DG KNEE - 3 VIEWS  Comparison: 12/22/2010  Findings: Moderate to severe tricompartmental degenerative change again identified.  Trace suprapatellar fluid noted.  The patella tracks appropriately in the patellofemoral groove.  No fracture or  dislocation.  Vascular calcification noted.  IMPRESSION: Moderate to severe tricompartmental degenerative change.  No acute finding.  Original Report Authenticated By: Harrel Lemon, M.D.    Medications: I have reviewed the patient's current medications. Scheduled Meds:   . aspirin EC  81 mg Oral Daily  . B-complex with vitamin C  1 tablet Oral Daily  . calcium carbonate  1 tablet Oral Daily  .  chlorhexidine   Topical Daily  . darifenacin  7.5 mg Oral Daily  . diltiazem  120 mg Oral Daily  . docusate sodium  100 mg Oral BID  . enoxaparin  40 mg Subcutaneous Q24H  . FLUoxetine  10 mg Oral Daily  . glipiZIDE  5 mg Oral QAC breakfast  .  HYDROmorphone (DILAUDID) injection  0.5 mg Intravenous Once  . insulin aspart  0-5 Units Subcutaneous QHS  . insulin aspart  0-9 Units Subcutaneous TID WC  . LORazepam  1 mg Intravenous Once  . metFORMIN  500 mg Oral BID WC  . multivitamins ther. w/minerals  1 tablet Oral Daily  . mupirocin ointment   Nasal BID  . naproxen  250 mg Oral BID WC  . niacin  1,000 mg Oral QHS  . omega-3 acid ethyl esters  1 g Oral BID  . potassium chloride SA  20 mEq Oral Daily  . propranolol  60 mg Oral Daily  . vancomycin  750 mg Intravenous Q12H   Continuous Infusions:   . sodium chloride 75 mL/hr at 03/09/11 1545   PRN Meds:.acetaminophen, acetaminophen, acetaminophen, alum & mag hydroxide-simeth, haloperidol lactate, ketorolac, ondansetron (ZOFRAN) IV, ondansetron, senna Assessment/Plan: Patient Active Hospital Problem List: Cellulitis and abscess (03/03/2011)   Assessment:cultures grew MRSA in both cultures. The patient is on vancomycin she continued on vancomycin for a total of 10-14 days. She will need a PICC line placed for posthospitalization antibiotics.  Diabetes mellitus type 2 in obese (03/03/2011)   Assessment: Sugars well-controlled  Fall (03/03/2011)   Assessment: Patient will need 24-hour supervision and will go home with home health physical  therapy for ongoing therapy.  UTI (lower urinary tract infection) (03/03/2011)   Assessment: Completed treatment empirically with ciprofloxacin  Altered mental status (03/06/2011)   Assessment: Patient to baseline of mental function and  MRSA (methicillin resistant Staphylococcus aureus) infection (03/09/2011)   Assessment: See above    plan to discharge home with home health tomorrow once PICC line is placed.   LOS: 7 days

## 2011-03-11 DIAGNOSIS — L0291 Cutaneous abscess, unspecified: Secondary | ICD-10-CM | POA: Diagnosis present

## 2011-03-11 LAB — GLUCOSE, CAPILLARY
Glucose-Capillary: 110 mg/dL — ABNORMAL HIGH (ref 70–99)
Glucose-Capillary: 117 mg/dL — ABNORMAL HIGH (ref 70–99)
Glucose-Capillary: 88 mg/dL (ref 70–99)

## 2011-03-11 LAB — VANCOMYCIN, TROUGH: Vancomycin Tr: 14.9 ug/mL (ref 10.0–20.0)

## 2011-03-11 MED ORDER — SODIUM CHLORIDE 0.9 % IJ SOLN: 10.0000 mL | Freq: Two times a day (BID) | INTRAMUSCULAR | Status: DC

## 2011-03-11 MED ORDER — VANCOMYCIN HCL 1000 MG IV SOLR: INTRAVENOUS | Status: DC

## 2011-03-11 MED ORDER — HALOPERIDOL LACTATE 5 MG/ML IJ SOLN
5.0000 mg | Freq: Once | INTRAMUSCULAR | Status: AC
  Administered 2011-03-11: 5 mg via INTRAVENOUS
  Filled 2011-03-11: qty 1

## 2011-03-11 MED ORDER — SODIUM CHLORIDE 0.9 % IJ SOLN
10.0000 mL | INTRAMUSCULAR | Status: DC | PRN
  Administered 2011-03-12: 10 mL

## 2011-03-11 NOTE — Progress Notes (Signed)
Spoke with patient's daughter Ms. Krystal Eaton today who are now verbalizes that she does not feel ready to take the patient home. I again explored the option of skilled nursing facility as she feels unprepared to care for her mother. She states that skilled nursing facilities not an option. I inquired her about the barriers where that would prevent her from taking her mother home and whether or not we could be of any assistance in ameliorating those barriers. She was unable to articulate any barriers. Just reinforcing that she just did not feel ready to take her mother home. She stated that she had to get her personal life together in order to accept her mother at home. I explained to her that if caring for the PICC line was a concern that the patient would get hurt last dose today of vancomycin here in the hospital and then see advanced home care nurses would be out in the morning to administer the antibiotic. At this point the patient is discharged home. If however the patient's daughter does not feel comfortable taking the patient home it is likely that the discharge will be held until the patient can be discharged to a safe environment. I've made the patient's bedside nurse Kim aware of this.

## 2011-03-11 NOTE — Progress Notes (Signed)
Patient's daughter, Harriett Sine, states that she feels it would be better for her mother to stay here until the morning.  She voices concern about her mother becoming more confused at night and thinks that it would be better for her to come home in the morning.  Dr. Ashley Royalty will be notified.  Case Manager notified about patient staying until the am.

## 2011-03-11 NOTE — Progress Notes (Signed)
ANTIBIOTIC CONSULT NOTE - FOLLOW UP  Pharmacy Consult for Vancomycin Indication: Multiple abscesses and spreading cellulitis   Allergies  Allergen Reactions  . Codeine Nausea Only and Other (See Comments)    In addition, most pain medications cause confusion, make patient light headed.    Patient Measurements: Height: 5\' 2"  (157.5 cm) Weight: 191 lb 5.8 oz (86.8 kg) IBW/kg (Calculated) : 50.1   Vital Signs: Temp: 98.2 F (36.8 C) (11/25 0500) Temp src: Oral (11/25 0500) BP: 146/66 mmHg (11/25 0500) Pulse Rate: 65  (11/24 2117) Intake/Output from previous day: 11/24 0701 - 11/25 0700 In: 2143.8 [P.O.:720; I.V.:973.8; IV Piggyback:450] Out: 1527 [Urine:1525; Stool:2] Intake/Output from this shift:    Labs:  Basename 03/10/11 0430 03/09/11 1003  WBC 6.7 --  HGB 11.8* --  PLT 192 --  LABCREA -- --  CREATININE 0.58 0.52   Estimated Creatinine Clearance: 55.5 ml/min (by C-G formula based on Cr of 0.58).  Vancomycin trough 14.6 on 11/23 at 1000  Microbiology: Recent Results (from the past 720 hour(s))  CULTURE, BLOOD (SINGLE)     Status: Normal   Collection Time   03/03/11  2:35 PM      Component Value Range Status Comment   Specimen Description BLOOD RIGHT ARM   Final    Special Requests BOTTLES DRAWN AEROBIC ONLY 5CC   Final    Setup Time [id number]   Final    Culture NO GROWTH 5 DAYS   Final    Report Status 03/10/2011 FINAL   Final   WOUND CULTURE     Status: Normal   Collection Time   03/03/11  7:19 PM      Component Value Range Status Comment   Specimen Description VULVA RIGHT LABIA   Final    Special Requests NONE   Final    Gram Stain     Final    Value: MODERATE WBC PRESENT, PREDOMINANTLY PMN     NO SQUAMOUS EPITHELIAL CELLS SEEN     MODERATE GRAM POSITIVE COCCI     IN CLUSTERS IN PAIRS   Culture     Final    Value: MODERATE METHICILLIN RESISTANT STAPHYLOCOCCUS AUREUS     Note: RIFAMPIN AND GENTAMICIN SHOULD NOT BE USED AS SINGLE DRUGS FOR  TREATMENT OF STAPH INFECTIONS. This organism DOES NOT demonstrate inducible Clindamycin resistance in vitro. CRITICAL RESULT CALLED TO, READ BACK BY AND VERIFIED WITH: CAROLINE O      03/06/11 AT 810 AM BY [facility]   Report Status 03/06/2011 FINAL   Final    Organism ID, Bacteria METHICILLIN RESISTANT STAPHYLOCOCCUS AUREUS   Final   MRSA PCR SCREENING     Status: Abnormal   Collection Time   03/04/11  7:15 PM      Component Value Range Status Comment   MRSA by PCR POSITIVE (*) NEGATIVE  Final   WOUND CULTURE     Status: Normal   Collection Time   03/06/11  3:03 PM      Component Value Range Status Comment   Specimen Description LEG   Final    Special Requests NONE   Final    Gram Stain     Final    Value: NO WBC SEEN     NO SQUAMOUS EPITHELIAL CELLS SEEN     MODERATE GRAM POSITIVE COCCI IN PAIRS     IN CLUSTERS   Culture     Final    Value: MODERATE METHICILLIN RESISTANT STAPHYLOCOCCUS AUREUS     Note:  RIFAMPIN AND GENTAMICIN SHOULD NOT BE USED AS SINGLE DRUGS FOR TREATMENT OF STAPH INFECTIONS. This organism DOES NOT demonstrate inducible Clindamycin resistance in vitro. CRITICAL RESULT CALLED TO, READ BACK BY AND VERIFIED WITH: Marni Griffon      03/09/11 10:50 BY [facility]   Report Status 03/09/2011 FINAL   Final    Organism ID, Bacteria METHICILLIN RESISTANT STAPHYLOCOCCUS AUREUS   Final     Anti-infectives     Start     Dose/Rate Route Frequency Ordered Stop   03/07/11 1000   vancomycin (VANCOCIN) 750 mg in sodium chloride 0.9 % 150 mL IVPB        750 mg 150 mL/hr over 60 Minutes Intravenous Every 12 hours 03/07/11 0930     03/03/11 2200   vancomycin (VANCOCIN) IVPB 1000 mg/200 mL premix  Status:  Discontinued        1,000 mg 200 mL/hr over 60 Minutes Intravenous Every 12 hours 03/03/11 2051 03/07/11 0930   03/03/11 2200   ciprofloxacin (CIPRO) IVPB 400 mg  Status:  Discontinued        400 mg 200 mL/hr over 60 Minutes Intravenous Every 12 hours 03/03/11 2051 03/06/11 1419    03/03/11 1630   clindamycin (CLEOCIN) IVPB 900 mg        900 mg 100 mL/hr over 30 Minutes Intravenous To Emergency Dept 03/03/11 1555 03/03/11 1751         Vancomycin 1g Q12 hr from 11/17 - 11/21, then vanc 750mg  Q12 hr from 11/21 - now  Assessment:  84 YOF on D5 Vancomycin 750 q12h for L thigh and R labial fold abscesses s/p I&D and spreading cellulitis of L forearm.   11/17 wound cx of the vulva/R labia with MRSA.  11/20 leg wound cx with MRSA.  11/17 blood cx no growth to date  Day 8 Vancomycin total  Goal of Therapy:  Vancomycin trough level 15-20 mcg/ml  Plan:   Continue vancomycin 750 mg iv q12h  Vancomycin trough level tonight before 1700 dose.  Probable discharge tomorrow with PICC line for continued IV antibiotics.  Lynann Beaver PharmD  Pager [phone] 03/11/2011 9:09 AM

## 2011-03-11 NOTE — Discharge Summary (Signed)
Sara [name] MRN: [id number] DOB/AGE: 1925-10-15 77 y.o.  Admit date: 03/03/2011 Discharge date: 03/11/2011  Primary Care Physician:  Herb Grays, MD, MD   Discharge Diagnoses:   Patient Active Problem List  Diagnoses  . History of breast cancer in female  . Diabetes mellitus type 2 in obese  . Hypertension associated with diabetes  . Hypertension associated with diabetes  . Uncomplicated senile dementia  . Osteoarthritis (arthritis due to wear and tear of joints)  . Lumbar disc disease  . Cellulitis and abscess  . Fall  . UTI (lower urinary tract infection)  . Altered mental status  . MRSA (methicillin resistant Staphylococcus aureus) infection  . Abscess of posterior thigh     DISCHARGE MEDICATION: Current Discharge Medication List    START taking these medications   Details  sodium chloride 0.9 % SOLN 150 mL with vancomycin 1000 MG SOLR 750 mg IV Q12h x 5 days per [facility] Vancomycin protocol. End date 03/16/2011 Qty: 7500 mg, Refills: 0      CONTINUE these medications which have NOT CHANGED   Details  acetaminophen (TYLENOL) 325 MG tablet Take 650 mg by mouth every 4 (four) hours as needed. pain    aspirin 81 MG tablet Take 81 mg by mouth daily.      B Complex Vitamins (VITAMIN-B COMPLEX PO) Take by mouth daily.      calcium carbonate 200 MG capsule Take 250 mg by mouth daily.      diltiazem (CARDIZEM CD) 120 MG 24 hr capsule Take 120 mg by mouth daily.     FLUoxetine (PROZAC) 10 MG capsule Take 10 mg by mouth daily.     glipiZIDE (GLUCOTROL XL) 5 MG 24 hr tablet Take 5 mg by mouth daily.      KLOR-CON M20 20 MEQ tablet Take 20 mEq by mouth daily.     metFORMIN (GLUCOPHAGE) 500 MG tablet Take 500 mg by mouth 2 (two) times daily with a meal.     Multiple Vitamin (MULTIVITAMIN) capsule Take 1 capsule by mouth daily.      Naproxen Sodium (ALEVE PO) Take 1 tablet by mouth daily as needed. Pain.    niacin 500 MG tablet Take 1,000 mg by mouth at bedtime.       Omega-3 Fatty Acids (OMEGA-3 FISH OIL PO) Take 1 capsule by mouth daily.     propranolol (INDERAL) 60 MG tablet Take 60 mg by mouth daily.     VESICARE 5 MG tablet Take 5 mg by mouth daily.            Consults: Treatment Team:  Md [facility]   SIGNIFICANT DIAGNOSTIC STUDIES:  Dg Knee Ap/lat W/sunrise Left  03/03/2011  *RADIOLOGY REPORT*  Clinical Data: Fall, knee swelling  DG KNEE - 3 VIEWS  Comparison: 12/22/2010  Findings: Moderate to severe tricompartmental degenerative change again identified.  Trace suprapatellar fluid noted.  The patella tracks appropriately in the patellofemoral groove.  No fracture or dislocation.  Vascular calcification noted.  IMPRESSION: Moderate to severe tricompartmental degenerative change.  No acute finding.  Original Report Authenticated By: Harrel Lemon, M.D.          OTHER PROCEDURES: Incision and drainage of posterior thigh abscess.  Recent Results (from the past 240 hour(s))  CULTURE, BLOOD (SINGLE)     Status: Normal   Collection Time   03/03/11  2:35 PM      Component Value Range Status Comment   Specimen Description BLOOD RIGHT ARM   Final  Special Requests BOTTLES DRAWN AEROBIC ONLY 5CC   Final    Setup Time [id number]   Final    Culture NO GROWTH 5 DAYS   Final    Report Status 03/10/2011 FINAL   Final   WOUND CULTURE     Status: Normal   Collection Time   03/03/11  7:19 PM      Component Value Range Status Comment   Specimen Description VULVA RIGHT LABIA   Final    Special Requests NONE   Final    Gram Stain     Final    Value: MODERATE WBC PRESENT, PREDOMINANTLY PMN     NO SQUAMOUS EPITHELIAL CELLS SEEN     MODERATE GRAM POSITIVE COCCI     IN CLUSTERS IN PAIRS   Culture     Final    Value: MODERATE METHICILLIN RESISTANT STAPHYLOCOCCUS AUREUS     Note: RIFAMPIN AND GENTAMICIN SHOULD NOT BE USED AS SINGLE DRUGS FOR TREATMENT OF STAPH INFECTIONS. This organism DOES NOT demonstrate inducible Clindamycin resistance in  vitro. CRITICAL RESULT CALLED TO, READ BACK BY AND VERIFIED WITH: CAROLINE O      03/06/11 AT 810 AM BY [facility]   Report Status 03/06/2011 FINAL   Final    Organism ID, Bacteria METHICILLIN RESISTANT STAPHYLOCOCCUS AUREUS   Final   MRSA PCR SCREENING     Status: Abnormal   Collection Time   03/04/11  7:15 PM      Component Value Range Status Comment   MRSA by PCR POSITIVE (*) NEGATIVE  Final   WOUND CULTURE     Status: Normal   Collection Time   03/06/11  3:03 PM      Component Value Range Status Comment   Specimen Description LEG   Final    Special Requests NONE   Final    Gram Stain     Final    Value: NO WBC SEEN     NO SQUAMOUS EPITHELIAL CELLS SEEN     MODERATE GRAM POSITIVE COCCI IN PAIRS     IN CLUSTERS   Culture     Final    Value: MODERATE METHICILLIN RESISTANT STAPHYLOCOCCUS AUREUS     Note: RIFAMPIN AND GENTAMICIN SHOULD NOT BE USED AS SINGLE DRUGS FOR TREATMENT OF STAPH INFECTIONS. This organism DOES NOT demonstrate inducible Clindamycin resistance in vitro. CRITICAL RESULT CALLED TO, READ BACK BY AND VERIFIED WITH: Marni Griffon      03/09/11 10:50 BY [facility]   Report Status 03/09/2011 FINAL   Final    Organism ID, Bacteria METHICILLIN RESISTANT STAPHYLOCOCCUS AUREUS   Final     BRIEF ADMITTING H & P: [name] is an 77 year old Caucasian female who had been very shaky and weak the last 3 days today she had 2 falls she came into the emergency room. She had noticed skin lesions which have been getting worse over the last 3 days and she attributed her weakness and shakiness to that. She has a past medical history of left breast cancer status post lumpectomy approximately 10 years ago and has been cancer free since and she received radiation and chemotherapy following her lumpectomy. Her last cancer screening visit was this past year and her mammogram was negative for any cancer and she is just following up on a yearly basis. Patient lives alone but has supportive family.  She has medical she has diabetes hypertension arthritis back problems and had received steroid injections of her back in the last few months. She  had spoken with her doctor regarding the steroid injections and she was told that they were not from the labs that had been found to have contaminants. She received antibiotics in the emergency room but blood cultures were obtained prior to receiving the antibiotics and wound culture and urine culture obtained post receiving antiantibiotic. her urinalysis in the emergency room showed many bacteria and white cells so a culture was obtained.     Hospital Course:  Present on Admission:   .Cellulitis and abscess: Patient was found to have an abscess in the right labial fold and cellulitis of the thigh. General surgery was consult and perform incision and drainage. Cultures taken from the abscess showed MRSA sensitive to vancomycin. The patient is on day #5/10 of vancomycin and will continue with vancomycin at home [facility].    .Abscess posterior thigh:  The incised and drained wound is being treated locally with iodoform packing and covered with a sterile gauze. General surgery is recommended local wound care for 4-6 weeks.   Marland KitchenMRSA (methicillin resistant Staphylococcus aureus) infection: See above  .Diabetes mellitus type 2 in obese: Blood sugars are well controlled continue on Glucophage and glipizide.   .Fall: The patient is status post multiple falls. Physical therapy saw evaluated the patient and recommended short-term skilled facility. However the patient's daughter wanted to take the patient home with her with home care services.   Marland KitchenUTI (lower urinary tract infection): The patient was treated Empirically with Ciprofloxacin. She's had good clinical improvement    Disposition and Follow-up:  Patient is being discharged home with her daughter. She will receive home care services [facility]. She is  to followup with her primary care physician Dr. Dewain Penning within one week.  Discharge Orders    Future Appointments: Provider: Department: Dept Phone: Center:   07/30/2011 9:00 AM [facility] [phone] [facility]     Future Orders Please Complete By Expires   Diet - low sodium heart healthy      Diet Carb Modified      Increase activity slowly      Change dressing (specify)      Comments:   Dressing change:Clean with saline, pack with iodoform and cover with sterile guaze 2 times per day.   Call MD for:  temperature >100.4      Increase activity slowly      Change dressing (specify)      Comments:   Dressing change:1 time per day using saline to clean. Packed with iodoform dressing and covered with guaze for 4-6 weeks.      DISCHARGE EXAM:  Blood pressure 146/66, pulse 65, temperature 98.2 F (36.8 C), temperature source Oral, resp. rate 18, height 5\' 2"  (1.575 m), weight 86.8 kg (191 lb 5.8 oz), SpO2 96.00%. General: Alert, awake, oriented x3, in no acute distress.  HEENT: [place]/AT PEERL, EOMI  Neck: Trachea midline, no masses, no thyromegal,y no JVD, no carotid bruit  OROPHARYNX: Moist, No exudate/ erythema/lesions.  Heart: Regular rate and rhythm, without murmurs, rubs, gallops, PMI non-displaced, no heaves or thrills on palpation.  Lungs: Clear to auscultation, no wheezing or rhonchi noted.  Abdomen: Soft, nontender, nondistended, positive bowel sounds, no masses no hepatosplenomegaly noted..  Musculoskeletal: No warm swelling or erythema around joints, no spinal tenderness noted.  Skin:Patient has an area on her left lateral forearm which is almost healed. The right labial fold which s/ p I and D  is packed  with iodoform and is clean dry and intact.      Basename 03/10/11 0430 03/09/11 1003  NA -- 138  K -- 3.9  CL -- 104  CO2 -- 24  GLUCOSE -- 175*  BUN -- 9  CREATININE 0.58 0.52  CALCIUM -- 9.4  MG -- --  PHOS -- --   No results found for  this basename: AST:2,ALT:2,ALKPHOS:2,BILITOT:2,PROT:2,ALBUMIN:2 in the last 72 hours No results found for this basename: LIPASE:2,AMYLASE:2 in the last 72 hours  Basename 03/10/11 0430  WBC 6.7  NEUTROABS 3.6  HGB 11.8*  HCT 35.3*  MCV 88.5  PLT 192    Signed: MATTHEWS,MICHELLE A. 03/11/2011, 2:16 PM

## 2011-03-11 NOTE — Progress Notes (Signed)
Pt set up with [facility] for IV ABX, HHRN, HHPT/HHOT. Pt discharged 11/25 but family not ready to accept pt home. Pharmacy at [facility] contacted. Per pharmacist Baxter Hire [phone] IV ABX will be delivered to home 11/26 am for pm dose if pt discharges in am. Per daughter will be ready for pt in am. CM unable to get daughter to sign IM but daughter aware possibly liable for cost of hospitalization after patient is discharged. Weekday CM please contact [facility] if pt does d/c 11/26.

## 2011-03-11 NOTE — Progress Notes (Signed)
ANTIBIOTIC CONSULT NOTE - FOLLOW UP  Pharmacy Consult for Vancomycin Indication: Multiple MRSA abscesses & spreading cellulitis  Patient Measurements: Height: 5\' 2"  (157.5 cm) Weight: 191 lb 5.8 oz (86.8 kg) IBW/kg (Calculated) : 50.1    Basename 03/10/11 0430 03/09/11 1003  WBC 6.7 --  HGB 11.8* --  PLT 192 --  LABCREA -- --  CREATININE 0.58 0.52   Vancomycin trough = 14.9 mcg/ml on 750mg  IV q12h CrCl = 80 ml/min normalized  Assessment: Vancomycin trough essentially at goal.  Goal of Therapy:  Vancomycin trough level 15-20 mcg/ml  Plan:  Continue vancomycin 750mg  IV q12h.  MD note indicates that patient will receive 5 more days of vancomycin.  Suggest that she have one more vancomycin trough and serum creatinine checked in ~3 more days if possible just to r/o accumulation.  Loralee Pacas R 03/11/2011,5:44 PM

## 2011-03-11 NOTE — Plan of Care (Signed)
Problem: Phase II Progression Outcomes Goal: Wound without signs/symptoms of infection, decreasing edema Outcome: Completed/Met Date Met:  03/11/11 Edema decreasing, especially noted on Left arm.  Maintaining BID dressing change schedule.  Redness also decreasing.

## 2011-03-11 NOTE — Progress Notes (Signed)
Dr. Ashley Royalty informed of daughter's request for patient to stay until am.

## 2011-03-11 NOTE — Progress Notes (Signed)
Cm spoke with pt with daughter at bedside concerning d/c planning. Per pt choice [facility] to provide HHRn,HHPT/OT. Per pt's family who will provide 24/7 care, they are not ready for pt to come home. Pt has been medically cleared and discharged by Dr. Deno Etienne. RN.Louie Bun Notified of family's concerns. [facility] notified if pt discharged for start of care 11/26 related to IV ABX. Pt and family educated concerning IIMPORTANT MEDICARE MESSAGE.

## 2011-03-12 MED ORDER — HEPARIN SOD (PORK) LOCK FLUSH 100 UNIT/ML IV SOLN
250.0000 [IU] | INTRAVENOUS | Status: AC | PRN
  Administered 2011-03-12: 250 [IU]

## 2011-03-12 NOTE — Progress Notes (Signed)
Interval History: Sara [name] is an 77 year old female who was admitted with weakness and skin lesions. She was diagnosed with cellulitis involving her left forearm status post fall, a left thigh abscess, and also an abscess on the right labial fold. Culture was taken of the right labial wound. She was put on empiric Cipro and vancomycin by the admitting M.D. With wound cultures growing MRSA. She was seen by Dr. Derrell Lolling who perfomed a bedside I&D on 03/06/11  ROS: No complaints.  Left inner thigh abscess not painful.  No fever/chills.   Objective: Vital signs in last 24 hours: Temp:  [98.1 F (36.7 C)-98.4 F (36.9 C)] 98.1 F (36.7 C) (11/26 0500) Pulse Rate:  [68-77] 77  (11/26 0500) Resp:  [16-20] 20  (11/26 0500) BP: (125-172)/(72-102) 172/102 mmHg (11/26 0500) SpO2:  [93 %-95 %] 95 % (11/26 0500) Weight change:  Last BM Date: 03/10/11  Intake/Output from previous day:  Intake/Output Summary (Last 24 hours) at 03/12/11 1049 Last data filed at 03/12/11 0900  Gross per 24 hour  Intake 2643.75 ml  Output    378 ml  Net 2265.75 ml     Physical Exam:  Gen: No acute distress.  Cardiovascular: Heart sounds regular with no murmurs, rubs, or gallops.  Respiratory: Lungs clear to auscultation bilaterally.  Gastrointestinal: Abdomen soft, nontender, nondistended with normal active bowel sounds.  Extremities: No clubbing, edema, or cyanosis.  Skin: Patient has an area on her left lateral forearm, left inner thigh, and right labial fold that are consistent with cellulitis. She had a small pustule on the left lateral forearm, which is now almost completely healed. The area of induration in the left inner thigh has a small incision and is packed.  The surrounding erythema has shrunk to about 3 cm. In diameter. I can no longer appreciate the pustule that was located on her right labia.  Lab Results: Basic Metabolic Panel:  Lab 03/10/11 4098 03/09/11 1003 03/07/11 0345  NA -- 138 145    K -- 3.9 5.1  CL -- 104 105  CO2 -- 24 24  GLUCOSE -- 175* 57*  BUN -- 9 23  CREATININE 0.58 0.52 1.37*  CALCIUM -- 9.4 9.7  MG -- -- --  PHOS -- -- --   CBC:  Lab 03/10/11 0430 03/07/11 0345  WBC 6.7 7.6  NEUTROABS 3.6 --  HGB 11.8* 11.8*  HCT 35.3* 35.9*  MCV 88.5 89.8  PLT 192 169   CBG:  Lab 03/12/11 0738 03/11/11 1657 03/11/11 1135 03/11/11 0824 03/10/11 2115  GLUCAP 111* 88 117* 110* 106*    Recent Results (from the past 240 hour(s))  CULTURE, BLOOD (SINGLE)     Status: Normal   Collection Time   03/03/11  2:35 PM      Component Value Range Status Comment   Specimen Description BLOOD RIGHT ARM   Final    Special Requests BOTTLES DRAWN AEROBIC ONLY 5CC   Final    Setup Time [id number]   Final    Culture NO GROWTH 5 DAYS   Final    Report Status 03/10/2011 FINAL   Final   WOUND CULTURE     Status: Normal   Collection Time   03/03/11  7:19 PM      Component Value Range Status Comment   Specimen Description VULVA RIGHT LABIA   Final    Special Requests NONE   Final    Gram Stain     Final    Value: MODERATE  WBC PRESENT, PREDOMINANTLY PMN     NO SQUAMOUS EPITHELIAL CELLS SEEN     MODERATE GRAM POSITIVE COCCI     IN CLUSTERS IN PAIRS   Culture     Final    Value: MODERATE METHICILLIN RESISTANT STAPHYLOCOCCUS AUREUS     Note: RIFAMPIN AND GENTAMICIN SHOULD NOT BE USED AS SINGLE DRUGS FOR TREATMENT OF STAPH INFECTIONS. This organism DOES NOT demonstrate inducible Clindamycin resistance in vitro. CRITICAL RESULT CALLED TO, READ BACK BY AND VERIFIED WITH: CAROLINE O      03/06/11 AT 810 AM BY [facility]   Report Status 03/06/2011 FINAL   Final    Organism ID, Bacteria METHICILLIN RESISTANT STAPHYLOCOCCUS AUREUS   Final   MRSA PCR SCREENING     Status: Abnormal   Collection Time   03/04/11  7:15 PM      Component Value Range Status Comment   MRSA by PCR POSITIVE (*) NEGATIVE  Final   WOUND CULTURE     Status: Normal   Collection Time   03/06/11  3:03 PM       Component Value Range Status Comment   Specimen Description LEG   Final    Special Requests NONE   Final    Gram Stain     Final    Value: NO WBC SEEN     NO SQUAMOUS EPITHELIAL CELLS SEEN     MODERATE GRAM POSITIVE COCCI IN PAIRS     IN CLUSTERS   Culture     Final    Value: MODERATE METHICILLIN RESISTANT STAPHYLOCOCCUS AUREUS     Note: RIFAMPIN AND GENTAMICIN SHOULD NOT BE USED AS SINGLE DRUGS FOR TREATMENT OF STAPH INFECTIONS. This organism DOES NOT demonstrate inducible Clindamycin resistance in vitro. CRITICAL RESULT CALLED TO, READ BACK BY AND VERIFIED WITH: Marni Griffon      03/09/11 10:50 BY [facility]   Report Status 03/09/2011 FINAL   Final    Organism ID, Bacteria METHICILLIN RESISTANT STAPHYLOCOCCUS AUREUS   Final     Studies/Results: No results found.  Medications: Scheduled Meds:   . aspirin EC  81 mg Oral Daily  . B-complex with vitamin C  1 tablet Oral Daily  . calcium carbonate  1 tablet Oral Daily  . chlorhexidine   Topical Daily  . darifenacin  7.5 mg Oral Daily  . diltiazem  120 mg Oral Daily  . docusate sodium  100 mg Oral BID  . enoxaparin  40 mg Subcutaneous Q24H  . FLUoxetine  10 mg Oral Daily  . glipiZIDE  5 mg Oral QAC breakfast  . haloperidol lactate  5 mg Intravenous Once  .  HYDROmorphone (DILAUDID) injection  0.5 mg Intravenous Once  . insulin aspart  0-5 Units Subcutaneous QHS  . insulin aspart  0-9 Units Subcutaneous TID WC  . LORazepam  1 mg Intravenous Once  . metFORMIN  500 mg Oral BID WC  . multivitamins ther. w/minerals  1 tablet Oral Daily  . mupirocin ointment   Nasal BID  . naproxen  250 mg Oral BID WC  . niacin  1,000 mg Oral QHS  . omega-3 acid ethyl esters  1 g Oral BID  . potassium chloride SA  20 mEq Oral Daily  . propranolol  60 mg Oral Daily  . sodium chloride  10 mL Intracatheter Q12H  . vancomycin  750 mg Intravenous Q12H   Continuous Infusions:   . sodium chloride 75 mL/hr at 03/11/11 1907   PRN Meds:.acetaminophen,  acetaminophen, alum &  mag hydroxide-simeth, haloperidol lactate, ketorolac, ondansetron (ZOFRAN) IV, ondansetron, senna, sodium chloride  Assessment/Plan:  Principal Problem:  *Cellulitis and abscess/MRSA (methicillin resistant Staphylococcus aureus) infection The patient is currently on day #10 of appropriate therapy with Vancomycin.  Will d/c home on an additional 4 days of vancomycin.   Active Problems:  Altered mental status May have been delirium versus a medication side effect. All narcotics have been discontinued.  Diabetes mellitus type 2 in obese CBGs 88-117, excellent control.  Fall The patient is status post multiple falls. Physical therapy saw evaluated the patient and recommended short-term skilled facility. However the patient's daughter wanted to take the patient home with her with home care services.   UTI (lower urinary tract infection)  S/P empiric treatment with Cipro.    LOS: 9 days   Hillery Aldo, MD Pager [phone]  03/12/2011, 10:49 AM

## 2011-03-12 NOTE — Discharge Instructions (Signed)

## 2011-03-12 NOTE — Progress Notes (Signed)
Patient became very agitated & combative. Patient was given prn Haldol 2 mg IV with no improvement noted. Patient continued to pull at tubing,  Push/pinch staff, & trying to stand (alarming bed). Patient refusing all 2200 medications & CBG's.  Patient family made aware of the situation & the patient son came in to stay with the patient. The patient son tried to get his mother to take her medications, but she continued to refuse. On-Call MD made aware of increased agitation & medication refusal. New Order given for One-Time Haldol dose. Patient continued to remain agitated throughout the night, but combativeness subsided. Bed Alarm remains active. Will make On-Coming RN aware & continue to monitor the patient. Bennetta Laos, RN

## 2011-03-12 NOTE — Progress Notes (Signed)
Physical Therapy Treatment Patient Details Name: Sara [name] MRN: [id number] DOB: 08/28/1925 Today's Date: 03/12/2011 1110-1150 2 scg PT Assessment/Plan  PT - Assessment/Plan Comments on Treatment Session: pt perkier than whe this writer stopped by at 900 when pt noted to be much more sluggish and leaning sideways. PT Plan: Discharge plan remains appropriate;Frequency remains appropriate (reinforced pt need for close 24hr supervision/assist) PT Frequency: Min 3X/week Follow Up Recommendations: Home health PT;24 hour supervision/assistance (dtr now taking pt home) Equipment Recommended: None recommended by PT PT Goals  Acute Rehab PT Goals PT Goal Formulation: With patient/family Time For Goal Achievement: 2 weeks Pt will go Supine/Side to Sit: with supervision;with HOB 0 degrees PT Goal: Supine/Side to Sit - Progress: Partly met Pt will go Sit to Supine/Side: with supervision PT Goal: Sit to Supine/Side - Progress: Progressing toward goal Pt will Transfer Sit to Stand/Stand to Sit: with supervision PT Transfer Goal: Sit to Stand/Stand to Sit - Progress: Progressing toward goal Pt will Ambulate: 51 - 150 feet PT Goal: Ambulate - Progress: Progressing toward goal Pt will Go Up / Down Stairs: 3-5 stairs;with min assist;with least restrictive assistive device PT Goal: Up/Down Stairs - Progress: Partly met  PT Treatment Precautions/Restrictions  Precautions Precautions: Fall Restrictions Weight Bearing Restrictions: No Mobility (including Balance) Bed Mobility Bed Mobility: Yes Supine to Sit: 4: Min assist;With rails Transfers Transfers: Yes Sit to Stand: 4: Min assist;From bed;From chair/3-in-1 Sit to Stand Details (indicate cue type and reason): vc to push from armrests Stand to Sit: To bed;To chair/3-in-1;With upper extremity assist;4: Min assist Stand to Sit Details: vc to reach to armrests and bed Ambulation/Gait Ambulation/Gait: Yes Ambulation/Gait Assistance: 4: Min  assist Ambulation Distance (Feet): 10 Feet (then 30 to practice steps, pt incontinent of urine) Assistive device: Rolling walker Gait Pattern: Step-through pattern Gait velocity: slow Stairs: Yes Stairs Assistance: 4: Min assist Stairs Assistance Details (indicate cue type and reason): vc to use one rail and turn sideways, pt used 2 rails anyway Stair Management Technique: One rail Right (pt used 2 anyway, reviewed with dtr. for pt to go sideways) Number of Stairs: 2  Height of Stairs: 6     Exercise    End of Session PT - End of Session Equipment Utilized During Treatment: Gait belt Activity Tolerance: Patient tolerated treatment well Patient left: in chair;with call bell in reach General Behavior During Session: [facility] for tasks performed Cognition:  (pt seemed slow to respon. pt had haldol overnight per dtr)  Rada Hay 03/12/2011, 1:35 PM

## 2011-04-18 ENCOUNTER — Emergency Department (HOSPITAL_COMMUNITY): Payer: Medicare Other

## 2011-04-18 ENCOUNTER — Inpatient Hospital Stay (HOSPITAL_COMMUNITY)
Admission: EM | Admit: 2011-04-18 | Discharge: 2011-04-23 | DRG: 101 | Disposition: A | Discharge status: Home / Self Care | Payer: Medicare Other | Attending: Internal Medicine | Admitting: Internal Medicine

## 2011-04-18 ENCOUNTER — Other Ambulatory Visit: Payer: Self-pay

## 2011-04-18 ENCOUNTER — Encounter (HOSPITAL_COMMUNITY): Payer: Self-pay | Admitting: Emergency Medicine

## 2011-04-18 DIAGNOSIS — R569 Unspecified convulsions: Principal | ICD-10-CM | POA: Diagnosis present

## 2011-04-18 DIAGNOSIS — Z888 Allergy status to other drugs, medicaments and biological substances status: Secondary | ICD-10-CM

## 2011-04-18 DIAGNOSIS — R51 Headache: Secondary | ICD-10-CM | POA: Diagnosis present

## 2011-04-18 DIAGNOSIS — Z7982 Long term (current) use of aspirin: Secondary | ICD-10-CM

## 2011-04-18 DIAGNOSIS — F0390 Unspecified dementia without behavioral disturbance: Secondary | ICD-10-CM | POA: Diagnosis present

## 2011-04-18 DIAGNOSIS — M129 Arthropathy, unspecified: Secondary | ICD-10-CM | POA: Diagnosis present

## 2011-04-18 DIAGNOSIS — R4701 Aphasia: Secondary | ICD-10-CM | POA: Diagnosis present

## 2011-04-18 DIAGNOSIS — I4891 Unspecified atrial fibrillation: Secondary | ICD-10-CM | POA: Diagnosis present

## 2011-04-18 DIAGNOSIS — E876 Hypokalemia: Secondary | ICD-10-CM | POA: Diagnosis present

## 2011-04-18 DIAGNOSIS — E119 Type 2 diabetes mellitus without complications: Secondary | ICD-10-CM | POA: Diagnosis present

## 2011-04-18 DIAGNOSIS — Z853 Personal history of malignant neoplasm of breast: Secondary | ICD-10-CM

## 2011-04-18 DIAGNOSIS — Z8673 Personal history of transient ischemic attack (TIA), and cerebral infarction without residual deficits: Secondary | ICD-10-CM

## 2011-04-18 DIAGNOSIS — G459 Transient cerebral ischemic attack, unspecified: Secondary | ICD-10-CM | POA: Diagnosis present

## 2011-04-18 DIAGNOSIS — I10 Essential (primary) hypertension: Secondary | ICD-10-CM | POA: Diagnosis present

## 2011-04-18 DIAGNOSIS — Z79899 Other long term (current) drug therapy: Secondary | ICD-10-CM

## 2011-04-18 DIAGNOSIS — R404 Transient alteration of awareness: Secondary | ICD-10-CM | POA: Diagnosis present

## 2011-04-18 DIAGNOSIS — Z9181 History of falling: Secondary | ICD-10-CM

## 2011-04-18 DIAGNOSIS — I498 Other specified cardiac arrhythmias: Secondary | ICD-10-CM | POA: Diagnosis present

## 2011-04-18 LAB — CBC
HCT: 39.7 % (ref 36.0–46.0)
Hemoglobin: 13.2 g/dL (ref 12.0–15.0)
MCH: 29.8 pg (ref 26.0–34.0)
MCHC: 33.2 g/dL (ref 30.0–36.0)
MCV: 89.6 fL (ref 78.0–100.0)
Platelets: 198 10*3/uL (ref 150–400)
RBC: 4.43 MIL/uL (ref 3.87–5.11)
RDW: 14.5 % (ref 11.5–15.5)
WBC: 7.2 10*3/uL (ref 4.0–10.5)

## 2011-04-18 LAB — URINALYSIS, ROUTINE W REFLEX MICROSCOPIC
Bilirubin Urine: NEGATIVE
Glucose, UA: NEGATIVE mg/dL
Hgb urine dipstick: NEGATIVE
Ketones, ur: NEGATIVE mg/dL
Leukocytes, UA: NEGATIVE
Nitrite: NEGATIVE
Protein, ur: NEGATIVE mg/dL
Specific Gravity, Urine: 1.014 (ref 1.005–1.030)
Urobilinogen, UA: 0.2 mg/dL (ref 0.0–1.0)
pH: 5.5 (ref 5.0–8.0)

## 2011-04-18 LAB — BASIC METABOLIC PANEL
BUN: 15 mg/dL (ref 6–23)
CO2: 29 mEq/L (ref 19–32)
Calcium: 9.9 mg/dL (ref 8.4–10.5)
Chloride: 104 mEq/L (ref 96–112)
Creatinine, Ser: 0.65 mg/dL (ref 0.50–1.10)
GFR calc Af Amer: >90 mL/min (ref >90)
GFR calc non Af Amer: 80 mL/min — ABNORMAL LOW (ref >90)
Glucose, Bld: 79 mg/dL (ref 70–99)
Potassium: 4.4 mEq/L (ref 3.5–5.1)
Sodium: 140 mEq/L (ref 135–145)

## 2011-04-18 NOTE — ED Notes (Signed)
Onset today 1700 patient eating with daughter starting to talk non sense witnessed by daughter.  Upon arrival patient talking clear sentences. Answering and following commands appropriate. Airway intact bilateral equal chest rise and fall.  Bilateral equal hand grasps moderate strength.  Patient acting herself stated by two family members at bedside compared to before. Pedal pulses +2 bilateral with +1 edema ankle and feet bilaterally.

## 2011-04-18 NOTE — H&P (Signed)
Sara [name] is an 78 y.o. female.   PCP - Dr. Dewain Penning Oncologist - Dr. Clide Dales Cardiologist - [facility] Chief Complaint: Difficulty speaking. HPI: History obtained from patient's daughter. 78 year old female with history of breast cancer status post left-sided lumpectomy and radiation 10 years ago, diabetes mellitus type 2, paroxysmal atrial fibrillation not on Coumadin presently in sinus rhythm, hypertension , dementia and history of previous TIA was brought in the ER the patient's daughter found the patient had difficulty speaking at around 5 PM today. Patient was having supper when she was trying to talk and not able to bring the words out. It lasted for 45 minutes and by the time patient reached the ER her symptoms resolved. But patient still mildly confused and does not recall an episode. As per patient's daughter patient has been complaining of headache since morning. She does have chronic headache which happens every 3 or 4 days and gets relieved by Tylenol. Patient did not have any visual symptoms, and did not have any focal deficits. As per patient's daughter and EMS arrived patient walked towards the ambulance. Patient had a CAT scan of the head which was showing nothing acute and has been admitted for further workup. Since her symptoms of expressive aphasia has resolved patient was felt not to be a candidate for TPA. Patient lives alone at home but is frequented by her family every day.  Past Medical History  Diagnosis Date  . Cancer     breast - left  . Arthritis   . Diabetes mellitus   . Hypertension   . Generalized headaches   . Hearing loss   . Sore throat   . Confusion   . Weakness   . Mental disorder   . DEMENTIA   . Stroke 02-23-2011    recently diagnosed with mini-strokes    Past Surgical History  Procedure Date  . Back surgery     lower  . Vericose veins   . Abdominal hysterectomy   . Breast surgery   . Wrist surgery   . Cardiac catheterization   . Fracture  surgery     left arm 01/2011  . Mastectomy     LEft breast with lymph node removeal    Family History  Problem Relation Age of Onset  . Diabetes type II Son    Social History:  reports that she has never smoked. She has never used smokeless tobacco. She reports that she does not drink alcohol or use illicit drugs.  Allergies:  Allergies  Allergen Reactions  . Codeine Nausea Only and Other (See Comments)    In addition, most pain medications cause confusion, make patient light headed.    No current facility-administered medications on file as of 04/18/2011.   Medications Prior to Admission  Medication Sig Dispense Refill  . acetaminophen (TYLENOL) 325 MG tablet Take 650 mg by mouth every 4 (four) hours as needed. pain      . aspirin 81 MG tablet Take 81 mg by mouth daily.        . calcium carbonate 200 MG capsule Take 250 mg by mouth daily.        Marland Kitchen diltiazem (CARDIZEM CD) 120 MG 24 hr capsule Take 120 mg by mouth daily.       Marland Kitchen FLUoxetine (PROZAC) 10 MG capsule Take 10 mg by mouth daily.       Marland Kitchen glipiZIDE (GLUCOTROL XL) 5 MG 24 hr tablet Take 5 mg by mouth daily.        Marland Kitchen  KLOR-CON M20 20 MEQ tablet Take 20 mEq by mouth daily.       . metFORMIN (GLUCOPHAGE) 500 MG tablet Take 500 mg by mouth 2 (two) times daily with a meal.       . Multiple Vitamin (MULTIVITAMIN) capsule Take 1 capsule by mouth daily.        . niacin 500 MG tablet Take 1,000 mg by mouth at bedtime.       . Omega-3 Fatty Acids (OMEGA-3 FISH OIL PO) Take 1 capsule by mouth daily.       . propranolol (INDERAL) 60 MG tablet Take 60 mg by mouth daily.       . VESICARE 5 MG tablet Take 5 mg by mouth daily.         Results for orders placed during the hospital encounter of 04/18/11 (from the past 48 hour(s))  CBC     Status: Normal   Collection Time   04/18/11  6:56 PM      Component Value Range Comment   WBC 7.2  4.0 - 10.5 (K/uL)    RBC 4.43  3.87 - 5.11 (MIL/uL)    Hemoglobin 13.2  12.0 - 15.0 (g/dL)    HCT 96.0   45.4 - 09.8 (%)    MCV 89.6  78.0 - 100.0 (fL)    MCH 29.8  26.0 - 34.0 (pg)    MCHC 33.2  30.0 - 36.0 (g/dL)    RDW 11.9  14.7 - 82.9 (%)    Platelets 198  150 - 400 (K/uL)   BASIC METABOLIC PANEL     Status: Abnormal   Collection Time   04/18/11  6:56 PM      Component Value Range Comment   Sodium 140  135 - 145 (mEq/L)    Potassium 4.4  3.5 - 5.1 (mEq/L)    Chloride 104  96 - 112 (mEq/L)    CO2 29  19 - 32 (mEq/L)    Glucose, Bld 79  70 - 99 (mg/dL)    BUN 15  6 - 23 (mg/dL)    Creatinine, Ser 5.62  0.50 - 1.10 (mg/dL)    Calcium 9.9  8.4 - 10.5 (mg/dL)    GFR calc non Af Amer 80 (*) >90 (mL/min)    GFR calc Af Amer >90  >90 (mL/min)   URINALYSIS, ROUTINE W REFLEX MICROSCOPIC     Status: Normal   Collection Time   04/18/11  8:31 PM      Component Value Range Comment   Color, Urine YELLOW  YELLOW     APPearance CLEAR  CLEAR     Specific Gravity, Urine 1.014  1.005 - 1.030     pH 5.5  5.0 - 8.0     Glucose, UA NEGATIVE  NEGATIVE (mg/dL)    Hgb urine dipstick NEGATIVE  NEGATIVE     Bilirubin Urine NEGATIVE  NEGATIVE     Ketones, ur NEGATIVE  NEGATIVE (mg/dL)    Protein, ur NEGATIVE  NEGATIVE (mg/dL)    Urobilinogen, UA 0.2  0.0 - 1.0 (mg/dL)    Nitrite NEGATIVE  NEGATIVE     Leukocytes, UA NEGATIVE  NEGATIVE  MICROSCOPIC NOT DONE ON URINES WITH NEGATIVE PROTEIN, BLOOD, LEUKOCYTES, NITRITE, OR GLUCOSE <1000 mg/dL.   Ct Head Wo Contrast  04/18/2011  *RADIOLOGY REPORT*  Clinical Data: TIA.  Speech difficulty.  CT HEAD WITHOUT CONTRAST  Technique:  Contiguous axial images were obtained from the base of the skull through the vertex without  contrast.  Comparison: 10/16/2009  Findings: The brain shows generalized atrophy.  There are extensive chronic small vessel changes throughout the hemispheric white matter.  No identifiably acute infarction, mass lesion, hemorrhage, hydrocephalus or extra-axial collection.  The calvarium is unremarkable.  Sinuses, middle ears and mastoids are clear.   IMPRESSION: No acute or reversible findings.  Chronic brain atrophy with extensive chronic small vessel changes throughout the hemispheric white matter.  Original Report Authenticated By: Thomasenia Sales, M.D.    Review of Systems  Constitutional: Negative.   Eyes: Negative.   Respiratory: Negative.   Cardiovascular: Negative.   Gastrointestinal: Negative.   Genitourinary: Negative.   Musculoskeletal: Negative.   Skin: Negative.   Neurological: Positive for headaches.       Difficulty talking  Endo/Heme/Allergies: Negative.   Psychiatric/Behavioral: Negative.     Blood pressure 133/56, pulse 59, temperature 97.9 F (36.6 C), temperature source Oral, resp. rate 17, SpO2 93.00%. Physical Exam  Constitutional: She appears well-developed and well-nourished. No distress.  HENT:  Head: Normocephalic and atraumatic.  Right Ear: External ear normal.  Left Ear: External ear normal.  Nose: Nose normal.  Mouth/Throat: Oropharynx is clear and moist. No oropharyngeal exudate.  Eyes: Conjunctivae and EOM are normal. Pupils are equal, round, and reactive to light. Right eye exhibits no discharge. Left eye exhibits no discharge. No scleral icterus.  Neck: Normal range of motion. Neck supple.  Cardiovascular: Normal rate, regular rhythm and normal heart sounds.   Respiratory: Effort normal and breath sounds normal. No respiratory distress. She has no wheezes. She has no rales.  GI: Soft. Bowel sounds are normal. She exhibits no distension. There is no tenderness. There is no rebound.  Musculoskeletal: Normal range of motion. She exhibits no edema and no tenderness.  Neurological: She is alert.       Oriented to her name and place but does not know date and time. Appears little confused. Follows commands. Most upper and lower extremity5/5. No obvious facial asymmetry. Tongue is midline.  Skin: Skin is warm and dry. She is not diaphoretic. No erythema.  Psychiatric: Her behavior is normal.      Assessment/Plan #1. Brief episode of expressive aphasia which has resolved will have rule out TIA/CVA - have discussed with neurologist on call Dr.Dohmier. They'll be seeing patient in consult. We will get stroke workup done including MRI/MRA of the brain, carotid Doppler and 2-D echo patient to be in aspirin for now. Start diet if patient has to swallow. Patient is still mildly confused. Patient is afebrile we will hydrate patient, check ammonia level closely observe. #2. History of paroxysmal atrial fibrillation not on Coumadin - presently patient is in sinus rhythm. We'll continue aspirin for now. Check 2-D echo. Patient has had a recent fall and had sustained a fracture of her wrist requiring surgery. Not sure if patient may be a candidate for Coumadin due to risk for fall. #3. Headache - patient's daughter states she has chronic headache but was not diagnosed with migraine. Her headaches are usually in the frontal area has no visual symptoms or nausea vomiting dizziness. We will check sedimentation rate and further recommendations after tests ordered. #4. History of breast cancer status post lumpectomy and radiation and 5 years of Arimidex - followed by Dr. Caron Presume. #5. History of hypertension and diabetes mellitus2 - continue present medications but will hold off metformin. Patient will be on sliding scale coverage.  CODE STATUS - full code.  Eduard Clos 04/18/2011, 10:42 PM

## 2011-04-18 NOTE — ED Provider Notes (Cosign Needed)
History    85yF with confused speech. Onset 1700. Pt was sitting at dinner table when "began speaking japanese." Speech clear but didn't make any sense. Lasted about 5 minutes and then resolved. Pt currently seem tired to daughter and pt endorses this as well. C/o frontal HA. Onset of HA about 3d ago. Constant. No appreciable exacerbating or relieving factors. No recent falls. No urinary complaints. No n/v. No visual changes.  CSN: [id number]  Arrival date & time 04/18/11  1836   First MD Initiated Contact with Patient 04/18/11 1851      Chief Complaint  Patient presents with  . Transient Ischemic Attack    (Consider location/radiation/quality/duration/timing/severity/associated sxs/prior treatment) HPI  Past Medical History  Diagnosis Date  . Cancer     breast - left  . Arthritis   . Diabetes mellitus   . Hypertension   . Generalized headaches   . Hearing loss   . Sore throat   . Confusion   . Weakness   . Mental disorder   . DEMENTIA   . Stroke 02-23-2011    recently diagnosed with mini-strokes    Past Surgical History  Procedure Date  . Back surgery     lower  . Vericose veins   . Abdominal hysterectomy   . Breast surgery   . Wrist surgery   . Cardiac catheterization   . Fracture surgery     left arm 01/2011  . Mastectomy     LEft breast with lymph node removeal    No family history on file.  History  Substance Use Topics  . Smoking status: Never Smoker   . Smokeless tobacco: Never Used  . Alcohol Use: No    OB History    Grav Para Term Preterm Abortions TAB SAB Ect Mult Living                  Review of Systems   Review of symptoms negative unless otherwise noted in HPI.   Allergies  Codeine  Home Medications   Current Outpatient Rx  Name Route Sig Dispense Refill  . ACETAMINOPHEN 325 MG PO TABS Oral Take 650 mg by mouth every 4 (four) hours as needed. pain    . ASPIRIN 81 MG PO TABS Oral Take 81 mg by mouth daily.      Marland Kitchen VITAMIN-B  COMPLEX PO Oral Take by mouth daily.      Marland Kitchen CALCIUM CARBONATE 200 MG PO CAPS Oral Take 250 mg by mouth daily.      Marland Kitchen DILTIAZEM HCL ER COATED BEADS 120 MG PO CP24 Oral Take 120 mg by mouth daily.     Marland Kitchen FLUOXETINE HCL 10 MG PO CAPS Oral Take 10 mg by mouth daily.     Marland Kitchen GLIPIZIDE ER 5 MG PO TB24 Oral Take 5 mg by mouth daily.      Marland Kitchen KLOR-CON M20 20 MEQ PO TBCR Oral Take 20 mEq by mouth daily.     Marland Kitchen METFORMIN HCL 500 MG PO TABS Oral Take 500 mg by mouth 2 (two) times daily with a meal.     . MULTIVITAMINS PO CAPS Oral Take 1 capsule by mouth daily.      . ALEVE PO Oral Take 1 tablet by mouth daily as needed. Pain.    Marland Kitchen NIACIN 500 MG PO TABS Oral Take 1,000 mg by mouth at bedtime.     . OMEGA-3 FISH OIL PO Oral Take 1 capsule by mouth daily.     Marland Kitchen  PROPRANOLOL HCL 60 MG PO TABS Oral Take 60 mg by mouth daily.     Marland Kitchen VANCOMYCIN 750 MG IVPB  750 mg IV Q12h x 5 days per [facility] Vancomycin protocol. End date 03/16/2011 7500 mg 0  . VESICARE 5 MG PO TABS Oral Take 5 mg by mouth daily.       BP 129/77  Pulse 62  Temp(Src) 97.9 F (36.6 C) (Oral)  Resp 16  SpO2 96%  Physical Exam  Nursing note and vitals reviewed. Constitutional: She is oriented to person, place, and time. She appears well-developed and well-nourished. No distress.       Sitting up in bed. NAD.  HENT:  Head: Normocephalic and atraumatic.  Eyes: Conjunctivae are normal. Pupils are equal, round, and reactive to light. Right eye exhibits no discharge. Left eye exhibits no discharge.  Neck: Neck supple.  Cardiovascular: Normal rate, regular rhythm and normal heart sounds.  Exam reveals no gallop and no friction rub.   No murmur heard. Pulmonary/Chest: Effort normal and breath sounds normal. No respiratory distress.  Abdominal: Soft. She exhibits no distension. There is no tenderness.  Musculoskeletal: She exhibits no edema and no tenderness.  Neurological: She is alert and oriented to person, place, and time. No cranial nerve deficit.  She exhibits normal muscle tone. Coordination normal.       Good finger-to-nose b/l. No pronator drift. Speech clear and content appropriate.   Skin: Skin is warm and dry. She is not diaphoretic.  Psychiatric: Her behavior is normal. Thought content normal.       Pt quiet. Answering questions appropriately when addressed.    ED Course  Procedures (including critical care time)  Labs Reviewed  BASIC METABOLIC PANEL - Abnormal; Notable for the following:    GFR calc non Af Amer 80 (*)    All other components within normal limits  HEMOGLOBIN A1C - Abnormal; Notable for the following:    Hemoglobin A1C 5.9 (*)    Mean Plasma Glucose 123 (*)    All other components within normal limits  COMPREHENSIVE METABOLIC PANEL - Abnormal; Notable for the following:    Albumin 3.0 (*)    GFR calc non Af Amer 80 (*)    All other components within normal limits  AMMONIA - Abnormal; Notable for the following:    Ammonia <10 (*)    All other components within normal limits  LIPID PANEL - Abnormal; Notable for the following:    HDL 29 (*)    LDL Cholesterol 105 (*)    All other components within normal limits  HEMOGLOBIN A1C - Abnormal; Notable for the following:    Hemoglobin A1C 5.9 (*)    Mean Plasma Glucose 123 (*)    All other components within normal limits  GLUCOSE, CAPILLARY - Abnormal; Notable for the following:    Glucose-Capillary 158 (*)    All other components within normal limits  CBC  URINALYSIS, ROUTINE W REFLEX MICROSCOPIC  CBC  TSH  SEDIMENTATION RATE  MRSA PCR SCREENING  GLUCOSE, CAPILLARY  I-STAT TROPONIN I  POCT CBG MONITORING   Dg Chest 2 View  04/19/2011  *RADIOLOGY REPORT*  Clinical Data: Stroke  CHEST - 2 VIEW  Comparison: 02/06/2010  Findings: Shallow inspiration.  Heart size and pulmonary vascularity are normal for technique.  Calcified and tortuous aorta.  Slight fibrosis in the lung bases.  No focal airspace consolidation.  Degenerative changes in the thoracic  spine and shoulders.  Surgical clips in the left axilla.  Stable appearance since previous study.  IMPRESSION: Shallow inspiration.  No evidence of active pulmonary disease.  Original Report Authenticated By: Marlon Pel, M.D.   Ct Head Wo Contrast  04/18/2011  *RADIOLOGY REPORT*  Clinical Data: TIA.  Speech difficulty.  CT HEAD WITHOUT CONTRAST  Technique:  Contiguous axial images were obtained from the base of the skull through the vertex without contrast.  Comparison: 10/16/2009  Findings: The brain shows generalized atrophy.  There are extensive chronic small vessel changes throughout the hemispheric white matter.  No identifiably acute infarction, mass lesion, hemorrhage, hydrocephalus or extra-axial collection.  The calvarium is unremarkable.  Sinuses, middle ears and mastoids are clear.  IMPRESSION: No acute or reversible findings.  Chronic brain atrophy with extensive chronic small vessel changes throughout the hemispheric white matter.  Original Report Authenticated By: Thomasenia Sales, M.D.    6:56 PM Pt with symptoms concerning for TIA. No objective neuro findings on exam. CT, labs and admit for further eval.  EKG:  Rhythm: normal sinus Rate: ~62 Axis: Left Intervals/Conduction: RBBB. LAFB. Poor baseline. ST segments:NS ST changes   1. TIA (transient ischemic attack)       MDM  85yF with symptoms concerning for TIA. CT head with no acute findings. Admit for further tx and eval.        Raeford Razor, MD 04/19/11 1553

## 2011-04-18 NOTE — ED Notes (Signed)
Patient is resting comfortably. 

## 2011-04-18 NOTE — ED Notes (Signed)
Rn introduced to pt and family. No signs of distress; VSS.

## 2011-04-18 NOTE — ED Notes (Signed)
Patient transported to CT 

## 2011-04-18 NOTE — ED Notes (Signed)
Patient sitting with daughter eating and when talking did not make sense at 1700.  EMS started IV left AC, NSR, CBG 98 Daughter gave 81mg  aspirin today patient having a headache all day.  Alert talking clearly upon arrival.

## 2011-04-19 ENCOUNTER — Inpatient Hospital Stay (HOSPITAL_COMMUNITY): Payer: Medicare Other

## 2011-04-19 ENCOUNTER — Encounter (HOSPITAL_COMMUNITY): Payer: Self-pay

## 2011-04-19 LAB — COMPREHENSIVE METABOLIC PANEL
Albumin: 3 g/dL — ABNORMAL LOW (ref 3.5–5.2)
BUN: 11 mg/dL (ref 6–23)
Creatinine, Ser: 0.65 mg/dL (ref 0.50–1.10)
Total Bilirubin: 0.4 mg/dL (ref 0.3–1.2)
Total Protein: 6.1 g/dL (ref 6.0–8.3)

## 2011-04-19 LAB — HEMOGLOBIN A1C: Mean Plasma Glucose: 123 mg/dL — ABNORMAL HIGH (ref <117)

## 2011-04-19 LAB — CBC
MCV: 89 fL (ref 78.0–100.0)
Platelets: 169 10*3/uL (ref 150–400)
RBC: 4.27 MIL/uL (ref 3.87–5.11)
WBC: 6.4 10*3/uL (ref 4.0–10.5)

## 2011-04-19 LAB — GLUCOSE, CAPILLARY
Glucose-Capillary: 99 mg/dL (ref 70–99)
Glucose-Capillary: 158 mg/dL — ABNORMAL HIGH (ref 70–99)

## 2011-04-19 LAB — LIPID PANEL
Cholesterol: 163 mg/dL (ref 0–200)
Total CHOL/HDL Ratio: 5.6 RATIO

## 2011-04-19 LAB — MRSA PCR SCREENING: MRSA by PCR: NEGATIVE

## 2011-04-19 LAB — SEDIMENTATION RATE: Sed Rate: 8 mm/hr (ref 0–22)

## 2011-04-19 MED ORDER — DARIFENACIN HYDROBROMIDE ER 7.5 MG PO TB24
7.5000 mg | ORAL_TABLET | Freq: Every day | ORAL | Status: DC
  Administered 2011-04-19 – 2011-04-23 (×5): 7.5 mg via ORAL
  Filled 2011-04-19 (×5): qty 1

## 2011-04-19 MED ORDER — GLIPIZIDE ER 5 MG PO TB24
5.0000 mg | ORAL_TABLET | Freq: Every day | ORAL | Status: DC
  Administered 2011-04-19 – 2011-04-23 (×5): 5 mg via ORAL
  Filled 2011-04-19 (×5): qty 1

## 2011-04-19 MED ORDER — DILTIAZEM HCL ER COATED BEADS 120 MG PO CP24
120.0000 mg | ORAL_CAPSULE | Freq: Every day | ORAL | Status: DC
  Administered 2011-04-19 – 2011-04-23 (×5): 120 mg via ORAL
  Filled 2011-04-19 (×6): qty 1

## 2011-04-19 MED ORDER — NIACIN 500 MG PO TABS
1000.0000 mg | ORAL_TABLET | Freq: Every day | ORAL | Status: DC
  Administered 2011-04-19 – 2011-04-22 (×3): 1000 mg via ORAL
  Filled 2011-04-19 (×5): qty 2

## 2011-04-19 MED ORDER — SODIUM CHLORIDE 0.9 % IV SOLN
INTRAVENOUS | Status: DC
  Administered 2011-04-19: 03:00:00 via INTRAVENOUS

## 2011-04-19 MED ORDER — OMEGA-3 FISH OIL 1000 MG PO CAPS: 1.0000 | ORAL_CAPSULE | Freq: Every day | ORAL | Status: DC

## 2011-04-19 MED ORDER — DONEPEZIL HCL 5 MG PO TABS
5.0000 mg | ORAL_TABLET | Freq: Every day | ORAL | Status: DC
  Administered 2011-04-19: 5 mg via ORAL
  Filled 2011-04-19 (×2): qty 1

## 2011-04-19 MED ORDER — PROPRANOLOL HCL 60 MG PO TABS
60.0000 mg | ORAL_TABLET | Freq: Every day | ORAL | Status: DC
  Administered 2011-04-19 – 2011-04-21 (×3): 60 mg via ORAL
  Filled 2011-04-19 (×3): qty 1

## 2011-04-19 MED ORDER — SIMVASTATIN 5 MG PO TABS
5.0000 mg | ORAL_TABLET | Freq: Every day | ORAL | Status: DC
  Administered 2011-04-19 – 2011-04-22 (×4): 5 mg via ORAL
  Filled 2011-04-19 (×5): qty 1

## 2011-04-19 MED ORDER — SENNOSIDES-DOCUSATE SODIUM 8.6-50 MG PO TABS
1.0000 | ORAL_TABLET | Freq: Every evening | ORAL | Status: DC | PRN
  Filled 2011-04-19: qty 1

## 2011-04-19 MED ORDER — ADULT MULTIVITAMIN W/MINERALS CH
1.0000 | ORAL_TABLET | Freq: Every day | ORAL | Status: DC
  Administered 2011-04-19 – 2011-04-23 (×5): 1 via ORAL
  Filled 2011-04-19 (×5): qty 1

## 2011-04-19 MED ORDER — OMEGA-3-ACID ETHYL ESTERS 1 G PO CAPS
1.0000 g | ORAL_CAPSULE | Freq: Every day | ORAL | Status: DC
  Administered 2011-04-19 – 2011-04-23 (×5): 1 g via ORAL
  Filled 2011-04-19 (×5): qty 1

## 2011-04-19 MED ORDER — ZIPRASIDONE MESYLATE 20 MG IM SOLR
20.0000 mg | Freq: Once | INTRAMUSCULAR | Status: AC
  Administered 2011-04-19: 20 mg via INTRAMUSCULAR
  Filled 2011-04-19: qty 20

## 2011-04-19 MED ORDER — FLUOXETINE HCL 10 MG PO CAPS
10.0000 mg | ORAL_CAPSULE | Freq: Every day | ORAL | Status: DC
  Administered 2011-04-19 – 2011-04-23 (×5): 10 mg via ORAL
  Filled 2011-04-19 (×5): qty 1

## 2011-04-19 MED ORDER — INSULIN ASPART 100 UNIT/ML ~~LOC~~ SOLN
0.0000 [IU] | Freq: Three times a day (TID) | SUBCUTANEOUS | Status: DC
  Administered 2011-04-19: 2 [IU] via SUBCUTANEOUS
  Administered 2011-04-20 (×2): 1 [IU] via SUBCUTANEOUS
  Administered 2011-04-21: 2 [IU] via SUBCUTANEOUS
  Administered 2011-04-21: 1 [IU] via SUBCUTANEOUS
  Administered 2011-04-22: 2 [IU] via SUBCUTANEOUS
  Administered 2011-04-22: 1 [IU] via SUBCUTANEOUS
  Administered 2011-04-23: 2 [IU] via SUBCUTANEOUS
  Filled 2011-04-19: qty 3

## 2011-04-19 MED ORDER — HALOPERIDOL LACTATE 5 MG/ML IJ SOLN
1.0000 mg | Freq: Four times a day (QID) | INTRAMUSCULAR | Status: DC | PRN
  Filled 2011-04-19: qty 0.2

## 2011-04-19 MED ORDER — POTASSIUM CHLORIDE CRYS ER 20 MEQ PO TBCR
20.0000 meq | EXTENDED_RELEASE_TABLET | Freq: Every day | ORAL | Status: DC
  Filled 2011-04-19: qty 1

## 2011-04-19 MED ORDER — MULTIVITAMINS PO CAPS: 1.0000 | ORAL_CAPSULE | Freq: Every day | ORAL | Status: DC

## 2011-04-19 MED ORDER — QUETIAPINE FUMARATE 25 MG PO TABS
25.0000 mg | ORAL_TABLET | Freq: Every day | ORAL | Status: DC
  Administered 2011-04-19: 25 mg via ORAL
  Filled 2011-04-19 (×2): qty 1

## 2011-04-19 MED ORDER — ASPIRIN 300 MG RE SUPP
300.0000 mg | Freq: Every day | RECTAL | Status: DC
  Filled 2011-04-19 (×5): qty 1

## 2011-04-19 MED ORDER — ASPIRIN 325 MG PO TABS
325.0000 mg | ORAL_TABLET | Freq: Every day | ORAL | Status: DC
  Administered 2011-04-19 – 2011-04-23 (×5): 325 mg via ORAL
  Filled 2011-04-19 (×5): qty 1

## 2011-04-19 NOTE — Progress Notes (Signed)
Preformed RN beside swallow test per policy and MD order. Pt tolerated well with no coughing, gagging, or burping. Pt drinking water through a straw with no issues. Carb mod/cardiac diet ordered per MD request. No pt complaints. Will continue to monitor

## 2011-04-19 NOTE — Progress Notes (Addendum)
Pt arrived to floor, daughter at side and assisted with admission history. Pt complaint of a head ache since this morning; MD aware and pt has a history of frequent head aches. Neurological status normal except for alert to self, the season and to being in a hospital only; she said it was November, was at [facility] long, did not know why she was here, or what she did today.  Patients daughter stated she has been confused for the past 3 months and was at her new baseline.  Pt has no other complaints or signs of distress. Will continue to monitor

## 2011-04-19 NOTE — Progress Notes (Signed)
Speech Language/Pathology Speech Language Pathology Evaluation Patient Details Name: Sara [name] MRN: [id number] DOB: May 09, 1925 Today's Date: 04/19/2011  Problem List:  Patient Active Problem List  Diagnoses  . History of breast cancer in female  . Diabetes mellitus type 2 in obese  . Hypertension associated with diabetes  . Hypertension associated with diabetes  . Uncomplicated senile dementia  . Osteoarthritis (arthritis due to wear and tear of joints)  . Lumbar disc disease  . Fall  . Expressive aphasia  . Diabetes mellitus  . HTN (hypertension)   Past Medical History:  Past Medical History  Diagnosis Date  . Cancer     breast - left  . Arthritis   . Diabetes mellitus   . Hypertension   . Generalized headaches   . Hearing loss   . Sore throat   . Confusion   . Weakness   . Mental disorder   . DEMENTIA   . Stroke 02-23-2011    recently diagnosed with mini-strokes   Past Surgical History:  Past Surgical History  Procedure Date  . Back surgery     lower  . Vericose veins   . Abdominal hysterectomy   . Breast surgery   . Wrist surgery   . Cardiac catheterization   . Fracture surgery     left arm 01/2011  . Mastectomy     LEft breast with lymph node removeal    SLP Assessment/Plan/Recommendation Assessment Clinical Impression Statement: Aphasia has now resolved and highly suspect that current cognitive efunction (mild confusion, decreased safety awarenss, decreased short term memory) is patient's baseline given dx of dementia. Per RN and OT, patient has 24 hour supervision at home. Overall, is very functional with ADLs despite confusion however recommend continued 24 hour supervision secondary to decreased safety awareness. No skilled SLP f/u indicated at this time.  SLP Recommendation/Assessment: Patient does not need any further Speech Lanaguage Pathology Services No Skilled Speech Therapy: Patient at baseline level of functioning  [facility] MA,  CCC-SLP [phone]  Ferdinand Lango Meryl 04/19/2011, 9:57 AM

## 2011-04-19 NOTE — Consult Note (Signed)
Reason for Consult:"confusion, taking nonsense, staring off, picking on clothing"  HPI: Sara [name] is an 78 y.o. Female who was found to be talking nonsense at around 5 pm last night with staring off into space and picking on her clothing with subsequent confusion. No prior such episodes in the past. The event lasted 45 min. With full resolution. Patient is seen by Dr. Terrace Arabia and is on Aricept for dementia. Patient's daughter believes that Aricept is helpful.  Past Medical History  Diagnosis Date  . Cancer     breast - left  . Arthritis   . Diabetes mellitus   . Hypertension   . Generalized headaches   . Hearing loss   . Sore throat   . Confusion   . Weakness   . Mental disorder   . DEMENTIA   . Stroke 02-23-2011    recently diagnosed with mini-strokes   Medications: I have reviewed the patient's current medications.  Past Surgical History  Procedure Date  . Back surgery     lower  . Vericose veins   . Abdominal hysterectomy   . Breast surgery   . Wrist surgery   . Cardiac catheterization   . Fracture surgery     left arm 01/2011  . Mastectomy     LEft breast with lymph node removeal    Family History  Problem Relation Age of Onset  . Diabetes type II Son     Social History:  reports that she has never smoked. She has never used smokeless tobacco. She reports that she does not drink alcohol or use illicit drugs.  Allergies:  Allergies  Allergen Reactions  . Codeine Nausea Only and Other (See Comments)    In addition, most pain medications cause confusion, make patient light headed.   ROS: as above  Blood pressure 132/68, pulse 71, temperature 97.9 F (36.6 C), temperature source Oral, resp. rate 19, height 5\' 2"  (1.575 m), weight 85.3 kg (188 lb 0.8 oz), SpO2 96.00%.  Neurological exam: AAO*2. No aphasia.  Followed simple commands. Cranial nerves: EOMI, PERRL. Visual fields were full. Sensation to V1 through V3 areas of the face was intact and symmetric  throughout. There was no facial asymmetry. Hearing to finger rub was equal and symmetrical bilaterally. Shoulder shrug was 5/5 and symmetric bilaterally. Head rotation was 5/5 bilaterally. There was no dysarthria or palatal deviation. Motor: strength was 5/5 and symmetric throughout. Sensory: was intact throughout to light touch, pinprick. Coordination: finger-to-nose were intact and symmetric bilaterally. Reflexes: were 1+ in upper extremities and 1+ at the knees and 0 at the ankles. Plantar response was mute bilaterally. Gait: needed help to stand, wide-based, unsteady  Results for orders placed during the hospital encounter of 04/18/11 (from the past 48 hour(s))  CBC     Status: Normal   Collection Time   04/18/11  6:56 PM      Component Value Range Comment   WBC 7.2  4.0 - 10.5 (K/uL)    RBC 4.43  3.87 - 5.11 (MIL/uL)    Hemoglobin 13.2  12.0 - 15.0 (g/dL)    HCT 16.1  09.6 - 04.5 (%)    MCV 89.6  78.0 - 100.0 (fL)    MCH 29.8  26.0 - 34.0 (pg)    MCHC 33.2  30.0 - 36.0 (g/dL)    RDW 40.9  81.1 - 91.4 (%)    Platelets 198  150 - 400 (K/uL)   BASIC METABOLIC PANEL     Status:  Abnormal   Collection Time   04/18/11  6:56 PM      Component Value Range Comment   Sodium 140  135 - 145 (mEq/L)    Potassium 4.4  3.5 - 5.1 (mEq/L)    Chloride 104  96 - 112 (mEq/L)    CO2 29  19 - 32 (mEq/L)    Glucose, Bld 79  70 - 99 (mg/dL)    BUN 15  6 - 23 (mg/dL)    Creatinine, Ser 4.09  0.50 - 1.10 (mg/dL)    Calcium 9.9  8.4 - 10.5 (mg/dL)    GFR calc non Af Amer 80 (*) >90 (mL/min)    GFR calc Af Amer >90  >90 (mL/min)   URINALYSIS, ROUTINE W REFLEX MICROSCOPIC     Status: Normal   Collection Time   04/18/11  8:31 PM      Component Value Range Comment   Color, Urine YELLOW  YELLOW     APPearance CLEAR  CLEAR     Specific Gravity, Urine 1.014  1.005 - 1.030     pH 5.5  5.0 - 8.0     Glucose, UA NEGATIVE  NEGATIVE (mg/dL)    Hgb urine dipstick NEGATIVE  NEGATIVE     Bilirubin Urine NEGATIVE   NEGATIVE     Ketones, ur NEGATIVE  NEGATIVE (mg/dL)    Protein, ur NEGATIVE  NEGATIVE (mg/dL)    Urobilinogen, UA 0.2  0.0 - 1.0 (mg/dL)    Nitrite NEGATIVE  NEGATIVE     Leukocytes, UA NEGATIVE  NEGATIVE  MICROSCOPIC NOT DONE ON URINES WITH NEGATIVE PROTEIN, BLOOD, LEUKOCYTES, NITRITE, OR GLUCOSE <1000 mg/dL.  HEMOGLOBIN A1C     Status: Abnormal   Collection Time   04/19/11 12:10 AM      Component Value Range Comment   Hemoglobin A1C 5.9 (*) <5.7 (%)    Mean Plasma Glucose 123 (*) <117 (mg/dL)   AMMONIA     Status: Abnormal   Collection Time   04/19/11 12:10 AM      Component Value Range Comment   Ammonia <10 (*) 11 - 60 (umol/L)   SEDIMENTATION RATE     Status: Normal   Collection Time   04/19/11 12:10 AM      Component Value Range Comment   Sed Rate 8  0 - 22 (mm/hr)   MRSA PCR SCREENING     Status: Normal   Collection Time   04/19/11  1:03 AM      Component Value Range Comment   MRSA by PCR NEGATIVE  NEGATIVE    COMPREHENSIVE METABOLIC PANEL     Status: Abnormal   Collection Time   04/19/11  6:00 AM      Component Value Range Comment   Sodium 143  135 - 145 (mEq/L)    Potassium 3.9  3.5 - 5.1 (mEq/L)    Chloride 105  96 - 112 (mEq/L)    CO2 29  19 - 32 (mEq/L)    Glucose, Bld 92  70 - 99 (mg/dL)    BUN 11  6 - 23 (mg/dL)    Creatinine, Ser 8.11  0.50 - 1.10 (mg/dL)    Calcium 9.1  8.4 - 10.5 (mg/dL)    Total Protein 6.1  6.0 - 8.3 (g/dL)    Albumin 3.0 (*) 3.5 - 5.2 (g/dL)    AST 14  0 - 37 (U/L)    ALT 10  0 - 35 (U/L)    Alkaline Phosphatase 68  39 - 117 (U/L)    Total Bilirubin 0.4  0.3 - 1.2 (mg/dL)    GFR calc non Af Amer 80 (*) >90 (mL/min)    GFR calc Af Amer >90  >90 (mL/min)   CBC     Status: Normal   Collection Time   04/19/11  6:00 AM      Component Value Range Comment   WBC 6.4  4.0 - 10.5 (K/uL)    RBC 4.27  3.87 - 5.11 (MIL/uL)    Hemoglobin 12.6  12.0 - 15.0 (g/dL)    HCT 16.1  09.6 - 04.5 (%)    MCV 89.0  78.0 - 100.0 (fL)    MCH 29.5  26.0 - 34.0 (pg)     MCHC 33.2  30.0 - 36.0 (g/dL)    RDW 40.9  81.1 - 91.4 (%)    Platelets 169  150 - 400 (K/uL)   TSH     Status: Normal   Collection Time   04/19/11  6:00 AM      Component Value Range Comment   TSH 0.645  0.350 - 4.500 (uIU/mL)   LIPID PANEL     Status: Abnormal   Collection Time   04/19/11  6:00 AM      Component Value Range Comment   Cholesterol 163  0 - 200 (mg/dL)    Triglycerides 782  <150 (mg/dL)    HDL 29 (*) >95 (mg/dL)    Total CHOL/HDL Ratio 5.6      VLDL 29  0 - 40 (mg/dL)    LDL Cholesterol 621 (*) 0 - 99 (mg/dL)   GLUCOSE, CAPILLARY     Status: Normal   Collection Time   04/19/11  6:33 AM      Component Value Range Comment   Glucose-Capillary 99  70 - 99 (mg/dL)   GLUCOSE, CAPILLARY     Status: Abnormal   Collection Time   04/19/11 11:44 AM      Component Value Range Comment   Glucose-Capillary 158 (*) 70 - 99 (mg/dL)    Comment 1 Documented in Chart      Comment 2 Notify RN       Dg Chest 2 View  04/19/2011  *RADIOLOGY REPORT*  Clinical Data: Stroke  CHEST - 2 VIEW  Comparison: 02/06/2010  Findings: Shallow inspiration.  Heart size and pulmonary vascularity are normal for technique.  Calcified and tortuous aorta.  Slight fibrosis in the lung bases.  No focal airspace consolidation.  Degenerative changes in the thoracic spine and shoulders.  Surgical clips in the left axilla.  Stable appearance since previous study.  IMPRESSION: Shallow inspiration.  No evidence of active pulmonary disease.  Original Report Authenticated By: Marlon Pel, M.D.   Ct Head Wo Contrast  04/18/2011  *RADIOLOGY REPORT*  Clinical Data: TIA.  Speech difficulty.  CT HEAD WITHOUT CONTRAST  Technique:  Contiguous axial images were obtained from the base of the skull through the vertex without contrast.  Comparison: 10/16/2009  Findings: The brain shows generalized atrophy.  There are extensive chronic small vessel changes throughout the hemispheric white matter.  No identifiably acute infarction,  mass lesion, hemorrhage, hydrocephalus or extra-axial collection.  The calvarium is unremarkable.  Sinuses, middle ears and mastoids are clear.  IMPRESSION: No acute or reversible findings.  Chronic brain atrophy with extensive chronic small vessel changes throughout the hemispheric white matter.  Original Report Authenticated By: Thomasenia Sales, M.D.   Assessment/Plan: 78 years old woman with TIA vs. Complex  Partial Seizure 1) Agree with Echo, CD 2) Agree with telemetry 3) Agree with low dose statin 4) Ordered EEG for am 5) Increased Aricept to 10 mg daily 6) No seizure treatment unless EEG is epileptiform as this even if indeed is a seizure, is only a 1st life time event 7) Will follow  Juneau Doughman 04/19/2011, 1:29 PM

## 2011-04-19 NOTE — Progress Notes (Addendum)
Sara [name] CSN:[id number],MRN:[id number] is a 78 y.o. female,  Outpatient Primary MD for the patient is Herb Grays, MD, MD  Chief Complaint  Patient presents with  . Transient Ischemic Attack        Subjective:   Sara [name] today has, No headache, No chest pain, No abdominal pain - No Nausea, No new weakness tingling or numbness, No Cough - SOB.   Objective:   Filed Vitals:   04/19/11 0201 04/19/11 0355 04/19/11 0500 04/19/11 0610  BP: 135/2 134/75  131/69  Pulse: 60 60  66  Temp:    97.8 F (36.6 C)  TempSrc:    Oral  Resp: 18 16  18   Height:      Weight:   85.3 kg (188 lb 0.8 oz)   SpO2: 92% 97%  94%    Wt Readings from Last 3 Encounters:  04/19/11 85.3 kg (188 lb 0.8 oz)  03/11/11 86.8 kg (191 lb 5.8 oz)  02/05/11 90.901 kg (200 lb 6.4 oz)     Intake/Output Summary (Last 24 hours) at 04/19/11 0838 Last data filed at 04/19/11 0800  Gross per 24 hour  Intake  382.5 ml  Output    375 ml  Net    7.5 ml    Exam Awake Alert, Oriented *3, No new F.N deficits, Normal affect Long Neck.AT,PERRAL Supple Neck,No JVD, No cervical lymphadenopathy appriciated.  Symmetrical Chest wall movement, Good air movement bilaterally, CTAB RRR,No Gallops,Rubs or new Murmurs, No Parasternal Heave +ve B.Sounds, Abd Soft, Non tender, No organomegaly appriciated, No rebound -guarding or rigidity. No Cyanosis, Clubbing or edema, No new Rash or bruise    Data Review  CBC  Lab 04/19/11 0600 04/18/11 1856  WBC 6.4 7.2  HGB 12.6 13.2  HCT 38.0 39.7  PLT 169 198  MCV 89.0 89.6  MCH 29.5 29.8  MCHC 33.2 33.2  RDW 14.4 14.5  LYMPHSABS -- --  MONOABS -- --  EOSABS -- --  BASOSABS -- --  BANDABS -- --    Chemistries   Lab 04/19/11 0600 04/18/11 1856  NA 143 140  K 3.9 4.4  CL 105 104  CO2 29 29  GLUCOSE 92 79  BUN 11 15  CREATININE 0.65 0.65  CALCIUM 9.1 9.9  MG -- --  AST 14 --  ALT 10 --  ALKPHOS 68 --  BILITOT 0.4 --    ------------------------------------------------------------------------------------------------------------------ estimated creatinine clearance is 54 ml/min (by C-G formula based on Cr of 0.65). ------------------------------------------------------------------------------------------------------------------ No results found for this basename: HGBA1C:2 in the last 72 hours ------------------------------------------------------------------------------------------------------------------  Basename 04/19/11 0600  CHOL 163  HDL 29*  LDLCALC 105*  TRIG 147  CHOLHDL 5.6  LDLDIRECT --   ------------------------------------------------------------------------------------------------------------------ No results found for this basename: TSH,T4TOTAL,FREET3,T3FREE,THYROIDAB in the last 72 hours ------------------------------------------------------------------------------------------------------------------ No results found for this basename: VITAMINB12:2,FOLATE:2,FERRITIN:2,TIBC:2,IRON:2,RETICCTPCT:2 in the last 72 hours  Coagulation profile No results found for this basename: INR:5,PROTIME:5 in the last 168 hours  No results found for this basename: DDIMER:2 in the last 72 hours  Cardiac Enzymes No results found for this basename: CK:3,CKMB:3,TROPONINI:3,MYOGLOBIN:3 in the last 168 hours ------------------------------------------------------------------------------------------------------------------ No components found with this basename: POCBNP:3  Micro Results Recent Results (from the past 240 hour(s))  MRSA PCR SCREENING     Status: Normal   Collection Time   04/19/11  1:03 AM      Component Value Range Status Comment   MRSA by PCR NEGATIVE  NEGATIVE  Final     Radiology Reports Dg Chest 2 View  04/19/2011  *RADIOLOGY REPORT*  Clinical Data: Stroke  CHEST - 2 VIEW  Comparison: 02/06/2010  Findings: Shallow inspiration.  Heart size and pulmonary vascularity are normal for  technique.  Calcified and tortuous aorta.  Slight fibrosis in the lung bases.  No focal airspace consolidation.  Degenerative changes in the thoracic spine and shoulders.  Surgical clips in the left axilla.  Stable appearance since previous study.  IMPRESSION: Shallow inspiration.  No evidence of active pulmonary disease.  Original Report Authenticated By: Marlon Pel, M.D.   Ct Head Wo Contrast  04/18/2011  *RADIOLOGY REPORT*  Clinical Data: TIA.  Speech difficulty.  CT HEAD WITHOUT CONTRAST  Technique:  Contiguous axial images were obtained from the base of the skull through the vertex without contrast.  Comparison: 10/16/2009  Findings: The brain shows generalized atrophy.  There are extensive chronic small vessel changes throughout the hemispheric white matter.  No identifiably acute infarction, mass lesion, hemorrhage, hydrocephalus or extra-axial collection.  The calvarium is unremarkable.  Sinuses, middle ears and mastoids are clear.  IMPRESSION: No acute or reversible findings.  Chronic brain atrophy with extensive chronic small vessel changes throughout the hemispheric white matter.  Original Report Authenticated By: Thomasenia Sales, M.D.    Scheduled Meds:   . aspirin  300 mg Rectal Daily   Or  . aspirin  325 mg Oral Daily  . darifenacin  7.5 mg Oral Daily  . diltiazem  120 mg Oral Daily  . donepezil  5 mg Oral Daily  . FLUoxetine  10 mg Oral Daily  . glipiZIDE  5 mg Oral Daily  . insulin aspart  0-9 Units Subcutaneous TID WC  . mulitivitamin with minerals  1 tablet Oral Daily  . niacin  1,000 mg Oral QHS  . omega-3 acid ethyl esters  1 g Oral Daily  . propranolol  60 mg Oral Daily  . simvastatin  5 mg Oral q1800  . DISCONTD: multivitamin  1 capsule Oral Daily  . DISCONTD: omega-3 fish oil  1 capsule Oral Daily  . DISCONTD: potassium chloride SA  20 mEq Oral Daily   Continuous Infusions:   . sodium chloride 75 mL/hr at 04/19/11 0254   PRN  Meds:.senna-docusate  Assessment & Plan   #1. Brief episode of expressive aphasia which has resolved will have rule out TIA/CVA - neuro to see the patient in consult. We will get stroke workup done including MRI/MRA of the brain, carotid Doppler and 2-D echo patient to be in aspirin for now. PT-OT, Speech to see as multiple falls recently too,  ? Patient is still mildly confused. A1c pending, lipid panel noted on statin.  Lab Results  Component Value Date   CHOL 163 04/19/2011   HDL 29* 04/19/2011   LDLCALC 105* 04/19/2011   TRIG 147 04/19/2011   CHOLHDL 5.6 04/19/2011     #2. History of paroxysmal atrial fibrillation not on Coumadin - presently patient is in sinus rhythm. We'll continue aspirin for now. Check 2-D echo. Patient has had a recent fall and had sustained a fracture of her wrist requiring surgery. Not sure if patient may be a candidate for Coumadin due to risk for fall.   #3. Headache - patient's daughter states she has chronic headache , ESR 8, outpt follow.  #4. History of breast cancer status post lumpectomy and radiation and 5 years of Arimidex - followed by Dr. Caron Presume.   #5. History of hypertension and diabetes mellitus2 - continue present medications but will hold  off metformin. Patient will be on sliding scale coverage.  CBG (last 3)   Basename 04/19/11 1191  GLUCAP 99    DVT Prophylaxis  SCDs  See all Orders from today for further details     Leroy Sea M.D on 04/19/2011 at 8:38 AM  [facility] Office  [phone]

## 2011-04-19 NOTE — Progress Notes (Signed)
Physical Therapy Evaluation Patient Details Name: Sara [name] MRN: [id number] DOB: May 04, 1925 Today's Date: 04/19/2011  Problem List:  Patient Active Problem List  Diagnoses  . History of breast cancer in female  . Diabetes mellitus type 2 in obese  . Hypertension associated with diabetes  . Hypertension associated with diabetes  . Uncomplicated senile dementia  . Osteoarthritis (arthritis due to wear and tear of joints)  . Lumbar disc disease  . Fall  . Expressive aphasia  . Diabetes mellitus  . HTN (hypertension)    Past Medical History:  Past Medical History  Diagnosis Date  . Cancer     breast - left  . Arthritis   . Diabetes mellitus   . Hypertension   . Generalized headaches   . Hearing loss   . Sore throat   . Confusion   . Weakness   . Mental disorder   . DEMENTIA   . Stroke 02-23-2011    recently diagnosed with mini-strokes   Past Surgical History:  Past Surgical History  Procedure Date  . Back surgery     lower  . Vericose veins   . Abdominal hysterectomy   . Breast surgery   . Wrist surgery   . Cardiac catheterization   . Fracture surgery     left arm 01/2011  . Mastectomy     LEft breast with lymph node removeal    PT Assessment/Plan/Recommendation PT Assessment Clinical Impression Statement: Pt admitted with expressive aphasia, now resolved.  Pt undergoing work up for possible CVA.  Daughter and son report pt's mobility to be a baseline. Prior to admission pt's family provided 24 hr supervision/assistance in pt's home.  Pt received HHPT 2 x week for transfers, gait, and UE strengthening.  Daughter reports pt has made significant gains with HHPT.  Will continue to see pt while in the hospital to continue addressing these mobility issues. PT Recommendation/Assessment: Patient will need skilled PT in the acute care venue PT Goals  Acute Rehab PT Goals PT Goal Formulation: With patient/family Time For Goal Achievement: 7 days Pt will go  Supine/Side to Sit: with modified independence PT Goal: Supine/Side to Sit - Progress: Not met Pt will go Sit to Supine/Side: with modified independence PT Goal: Sit to Supine/Side - Progress: Not met Pt will go Sit to Stand: with supervision PT Goal: Sit to Stand - Progress: Not met Pt will go Stand to Sit: with supervision PT Goal: Stand to Sit - Progress: Not met Pt will Stand: with supervision PT Goal: Stand - Progress: Not met Pt will Ambulate: >150 feet;with supervision;with rolling walker;Other (comment) (or her rollator) PT Goal: Ambulate - Progress: Not met Pt will Go Up / Down Stairs: 6-9 stairs;with min assist;Other (comment) (right rail) PT Goal: Up/Down Stairs - Progress: Not met  PT Evaluation Prior Functioning  Home Living Lives With: Alone (Family provides 24 hr. supervision.) Receives Help From: Family Type of Home: House Home Layout: One level Home Access: Stairs to enter Entrance Stairs-Rails: Right Entrance Stairs-Number of Steps: 7 Home Adaptive Equipment: Quad cane;Straight cane;Other (comment) (rollator) Prior Function Level of Independence: Needs assistance with homemaking;Needs assistance with gait;Needs assistance with tranfers Driving: No Vocation: Retired Producer, television/film/video: Awake/alert Overall Cognitive Status: History of cognitive impairments History of Cognitive Impairment: Appears at baseline functioning Orientation Level: Oriented to person Sensation/Coordination Sensation Light Touch: Appears Intact Extremity Assessment RUE Assessment RUE Assessment: Exceptions to [facility] (Decreased shoulder flexion right. ) LUE Assessment LUE Assessment: Within Functional Limits RLE  Assessment RLE Assessment: Within Functional Limits LLE Assessment LLE Assessment: Within Functional Limits Mobility (including Balance) Bed Mobility Bed Mobility: No Transfers Transfers: Yes Sit to Stand: 4: Min assist Stand to Sit: 4: Min  assist Ambulation/Gait Ambulation/Gait: Yes Ambulation/Gait Assistance: 3: Mod assist Ambulation/Gait Assistance Details (indicate cue type and reason): HHA on right, pt furniture walking with left hand.  Decreased speed with wide base of support.  Daughter reports that gait is at baseline, which is actually better than prior to 11/12 when pt started receiving HHPT. Ambulation Distance (Feet): 150 Feet Assistive device: 1 person hand held assist Gait Pattern: Decreased step length - right;Decreased step length - left;Decreased stride length Stairs: No Wheelchair Mobility Wheelchair Mobility: No    End of Session PT - End of Session Equipment Utilized During Treatment: Gait belt Activity Tolerance: Patient tolerated treatment well Patient left: in chair;with call bell in reach;with bed alarm set;with family/visitor present General Behavior During Session: [facility] for tasks performed Cognition: Impaired, at baseline  Georges Mouse 04/19/2011, 12:05 PM

## 2011-04-19 NOTE — Progress Notes (Signed)
Occupational Therapy Evaluation Patient Details Name: Sara [name] MRN: [id number] DOB: December 09, 1925 Today's Date: 04/19/2011  Problem List:  Patient Active Problem List  Diagnoses  . History of breast cancer in female  . Diabetes mellitus type 2 in obese  . Hypertension associated with diabetes  . Hypertension associated with diabetes  . Uncomplicated senile dementia  . Osteoarthritis (arthritis due to wear and tear of joints)  . Lumbar disc disease  . Fall  . Expressive aphasia  . Diabetes mellitus  . HTN (hypertension)    Past Medical History:  Past Medical History  Diagnosis Date  . Cancer     breast - left  . Arthritis   . Diabetes mellitus   . Hypertension   . Generalized headaches   . Hearing loss   . Sore throat   . Confusion   . Weakness   . Mental disorder   . DEMENTIA   . Stroke 02-23-2011    recently diagnosed with mini-strokes   Past Surgical History:  Past Surgical History  Procedure Date  . Back surgery     lower  . Vericose veins   . Abdominal hysterectomy   . Breast surgery   . Wrist surgery   . Cardiac catheterization   . Fracture surgery     left arm 01/2011  . Mastectomy     LEft breast with lymph node removeal    OT Assessment/Plan/Recommendation OT Assessment Clinical Impression Statement: Pt. will benefit from skilled OT to increase functional independence with ADLs and get pt. to supervision level at D/C with ADLs. OT Recommendation/Assessment: Patient will need skilled OT in the acute care venue OT Problem List: Decreased strength;Decreased activity tolerance;Decreased safety awareness;Decreased knowledge of use of DME or AE;Impaired balance (sitting and/or standing);Decreased cognition Barriers to Discharge: None OT Therapy Diagnosis : Generalized weakness;Cognitive deficits OT Plan OT Frequency: Min 2X/week OT Treatment/Interventions: Self-care/ADL training;DME and/or AE instruction;Therapeutic activities;Patient/family  education;Balance training;Cognitive remediation/compensation OT Recommendation Follow Up Recommendations: 24 hour supervision/assistance;Home health OT Equipment Recommended: None recommended by OT Individuals Consulted Consulted and Agree with Results and Recommendations: Patient OT Goals Acute Rehab OT Goals OT Goal Formulation: With patient Time For Goal Achievement: 2 weeks ADL Goals Pt Will Perform Grooming: with set-up;with supervision;Standing at sink ADL Goal: Grooming - Progress: Progressing toward goals Pt Will Perform Lower Body Bathing: with set-up;with supervision;Sit to stand from bed ADL Goal: Lower Body Bathing - Progress: Progressing toward goals Pt Will Perform Lower Body Dressing: with supervision;with set-up;Sit to stand from bed ADL Goal: Lower Body Dressing - Progress: Progressing toward goals Pt Will Transfer to Toilet: with supervision;Ambulation;Regular height toilet ADL Goal: Toilet Transfer - Progress: Progressing toward goals Pt Will Perform Toileting - Hygiene: with set-up;Sit to stand from 3-in-1/toilet ADL Goal: Toileting - Hygiene - Progress: Progressing toward goals  OT Evaluation Precautions/Restrictions  Precautions Precautions: Fall Restrictions Weight Bearing Restrictions: No Prior Functioning Home Living Lives With: Alone Receives Help From: Family (pt. has family 24/7 supervision) Type of Home: House Home Layout: One level Home Access: Stairs to enter Entrance Stairs-Rails: Right Entrance Stairs-Number of Steps: 7 Bathroom Shower/Tub: Forensic scientist: Standard Bathroom Accessibility: Yes How Accessible: Accessible via walker Home Adaptive Equipment: Quad cane;Straight cane;Other (comment) Prior Function Level of Independence: Needs assistance with homemaking;Needs assistance with gait;Needs assistance with tranfers Able to Take Stairs?: Yes Driving: No Vocation: Retired ADL ADL Eating/Feeding:  Simulated;Set up Where Assessed - Eating/Feeding: Chair Grooming: Performed;Wash/dry hands;Denture care;Teeth care;Minimal assistance;Set up Grooming Details (indicate cue type  and reason): Pt. provided with min assist for balance standing at sink Where Assessed - Grooming: Standing at sink Upper Body Bathing: Simulated;Chest;Right arm;Left arm;Abdomen;Minimal assistance Where Assessed - Upper Body Bathing: Sitting, bed Lower Body Bathing: Simulated;Minimal assistance Where Assessed - Lower Body Bathing: Sit to stand from bed Upper Body Dressing: Performed;Minimal assistance Upper Body Dressing Details (indicate cue type and reason): With donning gown Where Assessed - Upper Body Dressing: Sitting, bed Lower Body Dressing: Simulated;Moderate assistance Lower Body Dressing Details (indicate cue type and reason): With pulling socks up Where Assessed - Lower Body Dressing: Sitting, chair Toilet Transfer: Simulated;Minimal assistance Toilet Transfer Details (indicate cue type and reason): min verbal cues for hand placement and technique Toilet Transfer Method: Ambulating Toilet Transfer Equipment: Other (comment) (recliner) Toileting - Clothing Manipulation: Simulated;Minimal assistance Where Assessed - Toileting Clothing Manipulation: Standing Toileting - Hygiene: Moderate assistance;Performed Toileting - Hygiene Details (indicate cue type and reason): assist for thoroughness Where Assessed - Toileting Hygiene: Standing Tub/Shower Transfer: Not assessed Tub/Shower Transfer Method: Not assessed Equipment Used: Rolling walker ADL Comments: pt. incontinent of urine upon arrival and provided assist for hygiene. Pt. completed ADL tasks at sink ~10 mins and mod verbal cues for safety and sequencing of oral caure and denture care. Pt. states she lives with her mother and when provided assist to problem solve pt able to identify her age and how her mother cannot be the same age, however continued to  state she lived with her mother. Pt probably close to baseline with cognition. Vision/Perception  Vision - History Baseline Vision: No visual deficits Patient Visual Report: No change from baseline Vision - Assessment Eye Alignment: Within Functional Limits Vision Assessment: Vision not tested Praxis Praxis: Intact Cognition Cognition Arousal/Alertness: Awake/alert Overall Cognitive Status: History of cognitive impairments History of Cognitive Impairment: Appears at baseline functioning Orientation Level: Oriented to person Sensation/Coordination Sensation Light Touch: Appears Intact Stereognosis: Not tested Hot/Cold: Not tested Proprioception: Not tested Coordination Gross Motor Movements are Fluid and Coordinated: Yes Fine Motor Movements are Fluid and Coordinated: Yes Extremity Assessment RUE Assessment RUE Assessment: Exceptions to [facility] (shoulder flexion ~90 degrees) LUE Assessment LUE Assessment: Within Functional Limits Mobility  Bed Mobility Bed Mobility: No Transfers Transfers: Yes Sit to Stand: 4: Min assist Stand to Sit: 4: Min assist Exercises   End of Session OT - End of Session Equipment Utilized During Treatment: Gait belt Activity Tolerance: Patient tolerated treatment well Patient left: in chair;with call bell in reach Nurse Communication: Mobility status for transfers General Behavior During Session: [facility] for tasks performed Cognition: Impaired, at baseline   Madilyne Tadlock, OTR/L Pager [phone] 04/19/2011, 12:32 PM

## 2011-04-19 NOTE — Progress Notes (Signed)
.  Clinical social worker completed patient psychosocial assessment, please see assessment in patient shadow chart. Pt and patient daughter refusing short term rehab at skilled nursing facility. Pt has 24 hour care at home provided by patient son and daughter. Pt family requesting home health services, and would like to continue working with advanced home health. CSW will inform pt rn case Production designer, theatre/television/film. No further csw needs. Signing off.   Catha Gosselin, LCSWA  [phone] .04/19/2011 15:08.

## 2011-04-20 ENCOUNTER — Inpatient Hospital Stay (HOSPITAL_COMMUNITY): Payer: Medicare Other

## 2011-04-20 HISTORY — PX: TRANSTHORACIC ECHOCARDIOGRAM: SHX275

## 2011-04-20 LAB — GLUCOSE, CAPILLARY
Glucose-Capillary: 129 mg/dL — ABNORMAL HIGH (ref 70–99)
Glucose-Capillary: 110 mg/dL — ABNORMAL HIGH (ref 70–99)
Glucose-Capillary: 127 mg/dL — ABNORMAL HIGH (ref 70–99)

## 2011-04-20 MED ORDER — HALOPERIDOL LACTATE 5 MG/ML IJ SOLN
2.0000 mg | Freq: Four times a day (QID) | INTRAMUSCULAR | Status: DC | PRN
  Administered 2011-04-20 – 2011-04-22 (×2): 2 mg via INTRAVENOUS
  Filled 2011-04-20: qty 0.4

## 2011-04-20 MED ORDER — HALOPERIDOL LACTATE 5 MG/ML IJ SOLN
1.0000 mg | Freq: Once | INTRAMUSCULAR | Status: DC
  Filled 2011-04-20: qty 0.2

## 2011-04-20 MED ORDER — QUETIAPINE FUMARATE 50 MG PO TABS
50.0000 mg | ORAL_TABLET | Freq: Every day | ORAL | Status: DC
  Administered 2011-04-20 – 2011-04-23 (×4): 50 mg via ORAL
  Filled 2011-04-20 (×4): qty 1

## 2011-04-20 MED ORDER — DONEPEZIL HCL 10 MG PO TABS
10.0000 mg | ORAL_TABLET | Freq: Every day | ORAL | Status: DC
  Administered 2011-04-21 – 2011-04-22 (×2): 10 mg via ORAL
  Filled 2011-04-20 (×4): qty 1

## 2011-04-20 MED ORDER — ZIPRASIDONE MESYLATE 20 MG IM SOLR: 20.0000 mg | Freq: Every day | INTRAMUSCULAR | Status: DC | PRN

## 2011-04-20 NOTE — Plan of Care (Signed)
Problem: Phase III Progression Outcomes Goal: Other Phase III Outcomes/Goals Outcome: Not Progressing Pt still very confused/disoriented

## 2011-04-20 NOTE — Progress Notes (Signed)
   CARE MANAGEMENT NOTE 04/20/2011  Patient:  Sara [name], Sara [name]   Account Number:  [id number]  Date Initiated:  04/20/2011  Documentation initiated by:  Tyreona Panjwani  Subjective/Objective Assessment:   PT WAS ADMITTED WITH TIA S/S WITH INABILITY TO SPEAK     Action/Plan:   PROGRESSION OF CARE AND DISCHARGE PLANNING   Anticipated DC Date:  04/23/2011   Anticipated DC Plan:  HOME W HOME HEALTH SERVICES  In-house referral  Clinical Social Worker      DC Planning Services  CM consult      Choice offered to / List presented to:             Status of service:  In process, will continue to follow Medicare Important Message given?   (If response is "NO", the following Medicare IM given date fields will be blank) Date Medicare IM given:   Date Additional Medicare IM given:    Discharge Disposition:    Per UR Regulation:  Reviewed for med. necessity/level of care/duration of stay  Comments:  04/20/11 Onnie Boer, RN, BSN PTA PT WAS AT HOME AND PT RECOMMENDED SNF, PT/ FAMILY REFUSED AND WANTS TO TAKE THE PT HOME WITH HH.  THEY HAD [facility] IN THE PAST.  WILL TALK WITH FAMILY TO SEE WHAT NEEDS ARE.

## 2011-04-20 NOTE — Progress Notes (Signed)
Sara [name] CSN:[id number],MRN:[id number] is a 78 y.o. female,  Outpatient Primary MD for the patient is Herb Grays, MD, MD  Chief Complaint  Patient presents with  . Transient Ischemic Attack        Subjective:   Sara [name] today has, No headache, No chest pain, No abdominal pain - No Nausea, No new weakness tingling or numbness, No Cough - SOB.   Objective:   Filed Vitals:   04/19/11 1146 04/19/11 1358 04/19/11 2054 04/20/11 0657  BP:  112/67 168/76 142/93  Pulse: 71 58 52 71  Temp:  97.8 F (36.6 C) 97.3 F (36.3 C) 97.6 F (36.4 C)  TempSrc:  Oral Oral Oral  Resp:  18 18 18   Height:      Weight:      SpO2: 96% 94% 96% 90%    Wt Readings from Last 3 Encounters:  04/19/11 85.3 kg (188 lb 0.8 oz)  03/11/11 86.8 kg (191 lb 5.8 oz)  02/05/11 90.901 kg (200 lb 6.4 oz)     Intake/Output Summary (Last 24 hours) at 04/20/11 1529 Last data filed at 04/19/11 2245  Gross per 24 hour  Intake      0 ml  Output    900 ml  Net   -900 ml    Exam Awake Oriented x 1, No new F.N deficits, Normal affect Xenia.AT,PERRAL Supple Neck,No JVD, No cervical lymphadenopathy appriciated.  Symmetrical Chest wall movement, Good air movement bilaterally, CTAB RRR,No Gallops,Rubs or new Murmurs, No Parasternal Heave +ve B.Sounds, Abd Soft, Non tender, No organomegaly appriciated, No rebound -guarding or rigidity. No Cyanosis, Clubbing or edema, No new Rash or bruise    Data Review  CBC  Lab 04/19/11 0600 04/18/11 1856  WBC 6.4 7.2  HGB 12.6 13.2  HCT 38.0 39.7  PLT 169 198  MCV 89.0 89.6  MCH 29.5 29.8  MCHC 33.2 33.2  RDW 14.4 14.5  LYMPHSABS -- --  MONOABS -- --  EOSABS -- --  BASOSABS -- --  BANDABS -- --    Chemistries   Lab 04/19/11 0600 04/18/11 1856  NA 143 140  K 3.9 4.4  CL 105 104  CO2 29 29  GLUCOSE 92 79  BUN 11 15  CREATININE 0.65 0.65  CALCIUM 9.1 9.9  MG -- --  AST 14 --  ALT 10 --  ALKPHOS 68 --  BILITOT 0.4 --    ------------------------------------------------------------------------------------------------------------------ estimated creatinine clearance is 54 ml/min (by C-G formula based on Cr of 0.65). ------------------------------------------------------------------------------------------------------------------  Basename 04/19/11 0600 04/19/11 0010  HGBA1C 5.9* 5.9*   ------------------------------------------------------------------------------------------------------------------  Basename 04/19/11 0600  CHOL 163  HDL 29*  LDLCALC 105*  TRIG 147  CHOLHDL 5.6  LDLDIRECT --   ------------------------------------------------------------------------------------------------------------------  Basename 04/19/11 0600  TSH 0.645  T4TOTAL --  T3FREE --  THYROIDAB --   ------------------------------------------------------------------------------------------------------------------ No results found for this basename: VITAMINB12:2,FOLATE:2,FERRITIN:2,TIBC:2,IRON:2,RETICCTPCT:2 in the last 72 hours  Coagulation profile No results found for this basename: INR:5,PROTIME:5 in the last 168 hours  No results found for this basename: DDIMER:2 in the last 72 hours  Cardiac Enzymes No results found for this basename: CK:3,CKMB:3,TROPONINI:3,MYOGLOBIN:3 in the last 168 hours ------------------------------------------------------------------------------------------------------------------ No components found with this basename: POCBNP:3  Micro Results Recent Results (from the past 240 hour(s))  MRSA PCR SCREENING     Status: Normal   Collection Time   04/19/11  1:03 AM      Component Value Range Status Comment   MRSA by PCR NEGATIVE  NEGATIVE  Final  Radiology Reports Dg Chest 2 View  04/19/2011  *RADIOLOGY REPORT*  Clinical Data: Stroke  CHEST - 2 VIEW  Comparison: 02/06/2010  Findings: Shallow inspiration.  Heart size and pulmonary vascularity are normal for technique.  Calcified and  tortuous aorta.  Slight fibrosis in the lung bases.  No focal airspace consolidation.  Degenerative changes in the thoracic spine and shoulders.  Surgical clips in the left axilla.  Stable appearance since previous study.  IMPRESSION: Shallow inspiration.  No evidence of active pulmonary disease.  Original Report Authenticated By: Marlon Pel, M.D.   Ct Head Wo Contrast  04/18/2011  *RADIOLOGY REPORT*  Clinical Data: TIA.  Speech difficulty.  CT HEAD WITHOUT CONTRAST  Technique:  Contiguous axial images were obtained from the base of the skull through the vertex without contrast.  Comparison: 10/16/2009  Findings: The brain shows generalized atrophy.  There are extensive chronic small vessel changes throughout the hemispheric white matter.  No identifiably acute infarction, mass lesion, hemorrhage, hydrocephalus or extra-axial collection.  The calvarium is unremarkable.  Sinuses, middle ears and mastoids are clear.  IMPRESSION: No acute or reversible findings.  Chronic brain atrophy with extensive chronic small vessel changes throughout the hemispheric white matter.  Original Report Authenticated By: Thomasenia Sales, M.D.    Scheduled Meds:    . aspirin  300 mg Rectal Daily   Or  . aspirin  325 mg Oral Daily  . darifenacin  7.5 mg Oral Daily  . diltiazem  120 mg Oral Daily  . donepezil  10 mg Oral Daily  . FLUoxetine  10 mg Oral Daily  . glipiZIDE  5 mg Oral Daily  . insulin aspart  0-9 Units Subcutaneous TID WC  . mulitivitamin with minerals  1 tablet Oral Daily  . niacin  1,000 mg Oral QHS  . omega-3 acid ethyl esters  1 g Oral Daily  . propranolol  60 mg Oral Daily  . QUEtiapine  50 mg Oral Q1200  . simvastatin  5 mg Oral q1800  . ziprasidone  20 mg Intramuscular Once  . DISCONTD: donepezil  5 mg Oral Daily  . DISCONTD: haloperidol lactate  1 mg Intramuscular Once  . DISCONTD: QUEtiapine  25 mg Oral QHS   Continuous Infusions:  PRN Meds:.haloperidol lactate, senna-docusate,  DISCONTD: haloperidol lactate  Assessment & Plan   #1. Brief episode of expressive aphasia which has resolved will have rule out TIA/CVA - neuro seeing the patient in consult. Stable stroke workup done including MRI/MRA of the brain, carotid Doppler and 2-D echo patient to be in aspirin for now. PT-OT, Speech called. Stable A1c & lipid panel noted on statin. EEG pending.  Lab Results  Component Value Date   HGBA1C 5.9* 04/19/2011     Lab Results  Component Value Date   CHOL 163 04/19/2011   HDL 29* 04/19/2011   LDLCALC 105* 04/19/2011   TRIG 147 04/19/2011   CHOLHDL 5.6 04/19/2011     #2. History of paroxysmal atrial fibrillation not on Coumadin - presently patient is in sinus rhythm. We'll continue aspirin for now. Check 2-D echo. Patient has had a recent fall and had sustained a fracture of her wrist requiring surgery. Not sure if patient may be a candidate for Coumadin due to risk for fall.   #3. Headache - patient's daughter states she has chronic headache , ESR 8, outpt follow.  #4. History of breast cancer status post lumpectomy and radiation and 5 years of Arimidex - followed by Dr.  Ruben.   #5. History of hypertension and diabetes mellitus2 - continue present medications but will hold off metformin. Patient will be on sliding scale coverage.  CBG (last 3)   Basename 04/20/11 1157 04/20/11 0642 04/20/11 0204  GLUCAP 110* 129* 172*    #6. Delirium - per daughter reoccurring problem, Seroquel scheduled, PRN haldol, QTc - continue monitoring fall-Asp precautions, bed alarm.  DVT Prophylaxis  SCDs  See all Orders from today for further details  Daughter updated bedside 04-20-11   Leroy Sea M.D on 04/20/2011 at 3:29 PM  [facility] Office  [phone]

## 2011-04-20 NOTE — Consult Note (Signed)
Subjective: Patient is doing fine.She is a little sleepy.   Objective: Vital signs in last 24 hours: Temp:  [97.3 F (36.3 C)-97.9 F (36.6 C)] 97.6 F (36.4 C) (01/04 0657) Pulse Rate:  [52-71] 71  (01/04 0657) Resp:  [18-19] 18  (01/04 0657) BP: (112-168)/(67-93) 142/93 mmHg (01/04 0657) SpO2:  [90 %-98 %] 90 % (01/04 0657)  Intake/Output from previous day: 01/03 0701 - 01/04 0700 In: 345 [P.O.:120; I.V.:225] Out: 900 [Urine:900] Intake/Output this shift:   Nutritional status: Carb Control  Neurological exam: AAO*2. No aphasia. Followed simple commands. Cranial nerves: EOMI, PERRL. Visual fields were full. Sensation to V1 through V3 areas of the face was intact and symmetric throughout. There was no facial asymmetry. Hearing to finger rub was equal and symmetrical bilaterally. Shoulder shrug was 5/5 and symmetric bilaterally. Head rotation was 5/5 bilaterally. There was no dysarthria or palatal deviation. Motor: strength was 5/5 and symmetric throughout. Sensory: was intact throughout to light touch, pinprick. Coordination: finger-to-nose were intact and symmetric bilaterally. Reflexes: were 1+ in upper extremities and 1+ at the knees and 0 at the ankles. Plantar response was mute bilaterally. Gait: needed help to stand, wide-based, unsteady  Lab Results:  Basename 04/19/11 0600 04/18/11 1856  WBC 6.4 7.2  HGB 12.6 13.2  HCT 38.0 39.7  PLT 169 198  NA 143 140  K 3.9 4.4  CL 105 104  CO2 29 29  GLUCOSE 92 79  BUN 11 15  CREATININE 0.65 0.65  CALCIUM 9.1 9.9  LABA1C -- --   Lipid Panel  Basename 04/19/11 0600  CHOL 163  TRIG 147  HDL 29*  CHOLHDL 5.6  VLDL 29  LDLCALC 161*    Studies/Results: Dg Chest 2 View  04/19/2011  *RADIOLOGY REPORT*  Clinical Data: Stroke  CHEST - 2 VIEW  Comparison: 02/06/2010  Findings: Shallow inspiration.  Heart size and pulmonary vascularity are normal for technique.  Calcified and tortuous aorta.  Slight fibrosis in the lung bases.   No focal airspace consolidation.  Degenerative changes in the thoracic spine and shoulders.  Surgical clips in the left axilla.  Stable appearance since previous study.  IMPRESSION: Shallow inspiration.  No evidence of active pulmonary disease.  Original Report Authenticated By: Marlon Pel, M.D.   Ct Head Wo Contrast  04/18/2011  *RADIOLOGY REPORT*  Clinical Data: TIA.  Speech difficulty.  CT HEAD WITHOUT CONTRAST  Technique:  Contiguous axial images were obtained from the base of the skull through the vertex without contrast.  Comparison: 10/16/2009  Findings: The brain shows generalized atrophy.  There are extensive chronic small vessel changes throughout the hemispheric white matter.  No identifiably acute infarction, mass lesion, hemorrhage, hydrocephalus or extra-axial collection.  The calvarium is unremarkable.  Sinuses, middle ears and mastoids are clear.  IMPRESSION: No acute or reversible findings.  Chronic brain atrophy with extensive chronic small vessel changes throughout the hemispheric white matter.  Original Report Authenticated By: Thomasenia Sales, M.D.   Mr Brain Wo Contrast  04/19/2011  *RADIOLOGY REPORT*  Clinical Data:  Right-sided weakness.  Some confusion.  MRI HEAD WITHOUT CONTRAST MRA HEAD WITHOUT CONTRAST  Technique: Multiplanar, multiecho pulse sequences of the brain and surrounding structures were obtained according to standard protocol without intravenous contrast.  Angiographic images of the head were obtained using MRA technique without contrast.  Comparison: 01/02 of 2013 head CT.  No comparison MR.  MRI HEAD  Findings:  No acute infarct.  Prominent small vessel disease type changes.  Global atrophy.  Ventricular prominence probably related to atrophy rather hydrocephalus.  No intracranial hemorrhage.  No intracranial mass lesion detected on this unenhanced exam.  Minimal to mild paranasal sinus mucosal thickening most notable left sphenoid sinus air cells.  IMPRESSION: No  acute infarct.  Prominent small vessel disease type changes.  Global atrophy.  Ventricular prominence probably related to atrophy rather hydrocephalus.  Minimal to mild paranasal sinus mucosal thickening most notable left sphenoid sinus air cells.  MRA HEAD  Findings: Ectatic distal vertical cervical segment of the right internal carotid artery.  Mild to moderate narrowing distal M1 segment left middle cerebral artery.  Decreased number of visualized middle cerebral artery branches on the right.  Middle cerebral artery branch vessel irregularity bilaterally.  Codominant vertebral arteries.  Mild narrowing distal vertebral arteries.  Nonvisualization right PICA.  Mild narrowing proximal basilar artery.  Nonvisualization left AICA.  Narrowing proximal right AICA.  Moderate tandem stenosis superior cerebellar arteries more notable on the right.  Moderate narrowing proximal left posterior cerebral artery.  Mild to moderate narrowing proximal right posterior cerebral artery. Irregularity and mild narrowing of the posterior cerebral artery distal branches.  No aneurysm noted.  IMPRESSION: Intracranial atherosclerotic type changes as detailed above.  Original Report Authenticated By: Fuller Canada, M.D.   Mr Mra Head/brain Wo Cm  04/19/2011  *RADIOLOGY REPORT*  Clinical Data:  Right-sided weakness.  Some confusion.  MRI HEAD WITHOUT CONTRAST MRA HEAD WITHOUT CONTRAST  Technique: Multiplanar, multiecho pulse sequences of the brain and surrounding structures were obtained according to standard protocol without intravenous contrast.  Angiographic images of the head were obtained using MRA technique without contrast.  Comparison: 01/02 of 2013 head CT.  No comparison MR.  MRI HEAD  Findings:  No acute infarct.  Prominent small vessel disease type changes.  Global atrophy.  Ventricular prominence probably related to atrophy rather hydrocephalus.  No intracranial hemorrhage.  No intracranial mass lesion detected on this  unenhanced exam.  Minimal to mild paranasal sinus mucosal thickening most notable left sphenoid sinus air cells.  IMPRESSION: No acute infarct.  Prominent small vessel disease type changes.  Global atrophy.  Ventricular prominence probably related to atrophy rather hydrocephalus.  Minimal to mild paranasal sinus mucosal thickening most notable left sphenoid sinus air cells.  MRA HEAD  Findings: Ectatic distal vertical cervical segment of the right internal carotid artery.  Mild to moderate narrowing distal M1 segment left middle cerebral artery.  Decreased number of visualized middle cerebral artery branches on the right.  Middle cerebral artery branch vessel irregularity bilaterally.  Codominant vertebral arteries.  Mild narrowing distal vertebral arteries.  Nonvisualization right PICA.  Mild narrowing proximal basilar artery.  Nonvisualization left AICA.  Narrowing proximal right AICA.  Moderate tandem stenosis superior cerebellar arteries more notable on the right.  Moderate narrowing proximal left posterior cerebral artery.  Mild to moderate narrowing proximal right posterior cerebral artery. Irregularity and mild narrowing of the posterior cerebral artery distal branches.  No aneurysm noted.  IMPRESSION: Intracranial atherosclerotic type changes as detailed above.  Original Report Authenticated By: Fuller Canada, M.D.    Medications: I have reviewed the patient's current medications.  Assessment/Plan: 78 years old woman with dementia who likely had a seizure, but a TIA is a possibility and she is being worked up for it 1) Patient should follow up with Dr. Terrace Arabia (her neurologist) 2) I will review EEG and make any necessary changes in recommendations 3) Please follow up CD results and call neurology when  they are back for further input 4) Call with questions   LOS: 2 days   Monette Omara

## 2011-04-20 NOTE — Progress Notes (Signed)
  Echocardiogram 2D Echocardiogram has been performed.  Jorje Guild [facility] 04/20/2011, 4:15 PM

## 2011-04-20 NOTE — Progress Notes (Signed)
Pt tried to get out of bed multiple times. Began trying to reorient pt. Pt very confused, not oriented to place, or time disorganized speech. Pt had wet the bed. Changed the pt's linens with the help of 2 NT's and charge nurse. Pt very combative and had to help hold pt down. Pt dug her nails into everyone, threatened to "beat everyone up." Pt began slinging hands/legs around. Spoke therapeutically to the pt and tried to calm the pt down by distraction with the TV/asking about family. Pt refused to take her medications (Niacin and Donepezil). Tried multiple times to give the pt. Her medication but pt was too agitated and adamantly refused. Charge nurse also tried verbally calming the pt down. No attempts were successful. Gave pt PRN haldol 2mg . Pt is now resting in bed awake, bed alarm on, door open. Will continue to closely monitor the pt.

## 2011-04-20 NOTE — Consult Note (Signed)
EEG shows no epileptiform activity. In all likelihood, the event was a seizure, but please follow up CD results and call neurology for further input if there is significant (more than 50% stenosis on left). No treatment for first lifetime event. Follow up with Dr. Terrace Arabia. Call with questions.  Carmell Austria, MD

## 2011-04-20 NOTE — Progress Notes (Signed)
Pt had been pleasant and alert to self and location earlier in the night. Pt awoke at 0130 very confused (thought we were hanging clothes and washing dishes, and I was her young granddaughter), uncooperative, and trying to get out of the bed. Heart rate 7, 92%RA,  unable to obtain a blood pressure at the time. Hand grips and resistance present with moderate strength, pupils reactive, pt uncooperative for further eye assessment. CBG was 172. Called MD on call. Received orders for Haldol IM since pt did not have an IV. Pt began to fatigue herself and was asleep by 0245. Received medication from pharmacy until 0300 am and pt was asleep; medication not given. Pt has been asleep since. Pt bed alarm is still active.  Will continue to monitor

## 2011-04-21 DIAGNOSIS — G459 Transient cerebral ischemic attack, unspecified: Secondary | ICD-10-CM

## 2011-04-21 LAB — GLUCOSE, CAPILLARY
Glucose-Capillary: 185 mg/dL — ABNORMAL HIGH (ref 70–99)
Glucose-Capillary: 123 mg/dL — ABNORMAL HIGH (ref 70–99)
Glucose-Capillary: 136 mg/dL — ABNORMAL HIGH (ref 70–99)

## 2011-04-21 LAB — BASIC METABOLIC PANEL
BUN: 16 mg/dL (ref 6–23)
Creatinine, Ser: 0.6 mg/dL (ref 0.50–1.10)
GFR calc non Af Amer: 82 mL/min — ABNORMAL LOW (ref >90)
Glucose, Bld: 98 mg/dL (ref 70–99)
Potassium: 3.2 mEq/L — ABNORMAL LOW (ref 3.5–5.1)

## 2011-04-21 MED ORDER — POTASSIUM CHLORIDE CRYS ER 20 MEQ PO TBCR
40.0000 meq | EXTENDED_RELEASE_TABLET | Freq: Once | ORAL | Status: AC
  Administered 2011-04-21: 40 meq via ORAL
  Filled 2011-04-21: qty 2

## 2011-04-21 MED ORDER — GLUCAGON HCL (RDNA) 1 MG IJ SOLR
5.0000 mg | Freq: Two times a day (BID) | INTRAVENOUS | Status: DC | PRN
  Filled 2011-04-21: qty 5

## 2011-04-21 MED ORDER — GLUCAGON HCL (RDNA) 1 MG IJ SOLR: 5.0000 mg | Freq: Once | INTRAVENOUS | Status: DC

## 2011-04-21 MED ORDER — GLUCAGON HCL (RDNA) 1 MG IJ SOLR: 0.3000 mg | Freq: Once | INTRAMUSCULAR | Status: DC | PRN

## 2011-04-21 NOTE — Progress Notes (Addendum)
Milla Wahlberg CSN:[id number],MRN:[id number] is a 78 y.o. female,  Outpatient Primary MD for the patient is Herb Grays, MD, MD  Chief Complaint  Patient presents with  . Transient Ischemic Attack        Subjective:   [name] today has, No headache, No chest pain, No abdominal pain - No Nausea, No new weakness tingling or numbness, No Cough - SOB.   Objective:   Filed Vitals:   04/19/11 2054 04/20/11 0657 04/20/11 2141 04/21/11 0509  BP: 168/76 142/93 143/56 149/66  Pulse: 52 71 75 57  Temp: 97.3 F (36.3 C) 97.6 F (36.4 C) 97.5 F (36.4 C) 97.8 F (36.6 C)  TempSrc: Oral Oral Axillary Oral  Resp: 18 18 20 18   Height:      Weight:      SpO2: 96% 90%  95%    Wt Readings from Last 3 Encounters:  04/19/11 85.3 kg (188 lb 0.8 oz)  03/11/11 86.8 kg (191 lb 5.8 oz)  02/05/11 90.901 kg (200 lb 6.4 oz)     Intake/Output Summary (Last 24 hours) at 04/21/11 0842 Last data filed at 04/21/11 0500  Gross per 24 hour  Intake    720 ml  Output    300 ml  Net    420 ml    Exam Awake, Alert Oriented x 2, No new F.N deficits, Normal affect [place].AT,PERRAL Supple Neck,No JVD, No cervical lymphadenopathy appriciated.  Symmetrical Chest wall movement, Good air movement bilaterally, CTAB RRR,No Gallops,Rubs or new Murmurs, No Parasternal Heave +ve B.Sounds, Abd Soft, Non tender, No organomegaly appriciated, No rebound -guarding or rigidity. No Cyanosis, Clubbing or edema, No new Rash or bruise    Data Review  CBC  Lab 04/19/11 0600 04/18/11 1856  WBC 6.4 7.2  HGB 12.6 13.2  HCT 38.0 39.7  PLT 169 198  MCV 89.0 89.6  MCH 29.5 29.8  MCHC 33.2 33.2  RDW 14.4 14.5  LYMPHSABS -- --  MONOABS -- --  EOSABS -- --  BASOSABS -- --  BANDABS -- --    Chemistries   Lab 04/21/11 0500 04/19/11 0600 04/18/11 1856  NA 143 143 140  K 3.2* 3.9 4.4  CL 106 105 104  CO2 27 29 29   GLUCOSE 98 92 79  BUN 16 11 15   CREATININE 0.60 0.65 0.65  CALCIUM 8.8 9.1 9.9  MG 2.1 -- --  AST  -- 14 --  ALT -- 10 --  ALKPHOS -- 68 --  BILITOT -- 0.4 --   ------------------------------------------------------------------------------------------------------------------ estimated creatinine clearance is 54 ml/min (by C-G formula based on Cr of 0.6). ------------------------------------------------------------------------------------------------------------------  Basename 04/19/11 0600 04/19/11 0010  HGBA1C 5.9* 5.9*   ------------------------------------------------------------------------------------------------------------------  Basename 04/19/11 0600  CHOL 163  HDL 29*  LDLCALC 105*  TRIG 147  CHOLHDL 5.6  LDLDIRECT --   ------------------------------------------------------------------------------------------------------------------  Basename 04/19/11 0600  TSH 0.645  T4TOTAL --  T3FREE --  THYROIDAB --   ------------------------------------------------------------------------------------------------------------------ No results found for this basename: VITAMINB12:2,FOLATE:2,FERRITIN:2,TIBC:2,IRON:2,RETICCTPCT:2 in the last 72 hours  Coagulation profile No results found for this basename: INR:5,PROTIME:5 in the last 168 hours  No results found for this basename: DDIMER:2 in the last 72 hours  Cardiac Enzymes No results found for this basename: CK:3,CKMB:3,TROPONINI:3,MYOGLOBIN:3 in the last 168 hours ------------------------------------------------------------------------------------------------------------------ No components found with this basename: POCBNP:3  Micro Results Recent Results (from the past 240 hour(s))  MRSA PCR SCREENING     Status: Normal   Collection Time   04/19/11  1:03 AM  Component Value Range Status Comment   MRSA by PCR NEGATIVE  NEGATIVE  Final     Radiology Reports Dg Chest 2 View  04/19/2011  *RADIOLOGY REPORT*  Clinical Data: Stroke  CHEST - 2 VIEW  Comparison: 02/06/2010  Findings: Shallow inspiration.  Heart size and  pulmonary vascularity are normal for technique.  Calcified and tortuous aorta.  Slight fibrosis in the lung bases.  No focal airspace consolidation.  Degenerative changes in the thoracic spine and shoulders.  Surgical clips in the left axilla.  Stable appearance since previous study.  IMPRESSION: Shallow inspiration.  No evidence of active pulmonary disease.  Original Report Authenticated By: Marlon Pel, M.D.   Ct Head Wo Contrast  04/18/2011  *RADIOLOGY REPORT*  Clinical Data: TIA.  Speech difficulty.  CT HEAD WITHOUT CONTRAST  Technique:  Contiguous axial images were obtained from the base of the skull through the vertex without contrast.  Comparison: 10/16/2009  Findings: The brain shows generalized atrophy.  There are extensive chronic small vessel changes throughout the hemispheric white matter.  No identifiably acute infarction, mass lesion, hemorrhage, hydrocephalus or extra-axial collection.  The calvarium is unremarkable.  Sinuses, middle ears and mastoids are clear.  IMPRESSION: No acute or reversible findings.  Chronic brain atrophy with extensive chronic small vessel changes throughout the hemispheric white matter.  Original Report Authenticated By: Thomasenia Sales, M.D.    MRI-A  MRI HEAD  Findings: No acute infarct.  Prominent small vessel disease type changes.  Global atrophy. Ventricular prominence probably related to atrophy  rather hydrocephalus.  No intracranial hemorrhage.  No intracranial mass lesion detected on this unenhanced exam.  Minimal to mild paranasal sinus mucosal thickening most notable  left sphenoid sinus air cells.  IMPRESSION:  No acute infarct.  Prominent small vessel disease type changes.  Global atrophy. Ventricular prominence probably related to atrophy  rather hydrocephalus.  Minimal to mild paranasal sinus mucosal thickening most notable  left sphenoid sinus air cells.  MRA HEAD  Findings: Ectatic distal vertical cervical segment of the right    internal carotid artery.  Mild to moderate narrowing distal M1 segment left middle cerebral  artery.  Decreased number of visualized middle cerebral artery branches on  the right.  Middle cerebral artery branch vessel irregularity bilaterally.  Codominant vertebral arteries. Mild narrowing distal vertebral  arteries.  Nonvisualization right PICA.  Mild narrowing proximal basilar artery.  Nonvisualization left AICA. Narrowing proximal right AICA.  Moderate tandem stenosis superior cerebellar arteries more notable  on the right.  Moderate narrowing proximal left posterior cerebral artery. Mild  to moderate narrowing proximal right posterior cerebral artery.  Irregularity and mild narrowing of the posterior cerebral artery  distal branches.  No aneurysm noted.  IMPRESSION:  Intracranial atherosclerotic type changes as detailed above.   EEG EEG shows no epileptiform activity. In all likelihood, the event was a seizure, but please follow up CD results and call neurology for further input if there is significant (more than 50% stenosis on left). No treatment for first lifetime event. Follow up with Dr. Terrace Arabia. Call with questions  Echo   Scheduled Meds:    . aspirin  300 mg Rectal Daily   Or  . aspirin  325 mg Oral Daily  . darifenacin  7.5 mg Oral Daily  . diltiazem  120 mg Oral Daily  . donepezil  10 mg Oral Daily  . FLUoxetine  10 mg Oral Daily  . glipiZIDE  5 mg Oral Daily  . insulin  aspart  0-9 Units Subcutaneous TID WC  . mulitivitamin with minerals  1 tablet Oral Daily  . niacin  1,000 mg Oral QHS  . omega-3 acid ethyl esters  1 g Oral Daily  . potassium chloride  40 mEq Oral Once  . propranolol  60 mg Oral Daily  . QUEtiapine  50 mg Oral Q1200  . simvastatin  5 mg Oral q1800  . DISCONTD: donepezil  5 mg Oral Daily  . DISCONTD: haloperidol lactate  1 mg Intramuscular Once  . DISCONTD: QUEtiapine  25 mg Oral QHS   Continuous Infusions:  PRN Meds:.haloperidol  lactate, senna-docusate, DISCONTD: haloperidol lactate, DISCONTD: ziprasidone  Assessment & Plan   #1. Brief episode of expressive aphasia which has resolved will have rule out TIA/CVA - neuro seeing the patient in consult. Stable stroke workup done including MRI/MRA of the brain, pending - carotid Doppler and 2-D echo,  patient to be in aspirin for now. PT-OT, Speech called. Stable A1c & lipid panel noted on statin. EEG stable. follow up with Dr. Terrace Arabia (her neurologist) post DC.   Lab Results  Component Value Date   HGBA1C 5.9* 04/19/2011     Lab Results  Component Value Date   CHOL 163 04/19/2011   HDL 29* 04/19/2011   LDLCALC 105* 04/19/2011   TRIG 147 04/19/2011   CHOLHDL 5.6 04/19/2011     #2. History of Paroxysmal atrial fibrillation not on Coumadin - presently patient is in sinus rhythm. We'll continue aspirin for now. Check  2-D echo. Patient has had a recent fall and had sustained a fracture of her wrist requiring surgery. Not sure if patient may be a candidate for Coumadin due to risk for fall.    #3. Headache - patient's daughter states she has chronic headache , ESR 8, outpt follow.   #4. History of breast cancer status post lumpectomy and radiation and 5 years of Arimidex - followed by Dr. Caron Presume.    #5. History of diabetes mellitus 2 - continue hold off metformin. Patient will be on sliding scale coverage.   CBG (last 3)   Basename 04/21/11 0509 04/20/11 1633 04/20/11 1157  GLUCAP 109* 127* 110*     #6. Delirium - per daughter reoccurring problem, Seroquel scheduled, PRN haldol, QTc - continue monitoring fall-Asp precautions, bed alarm.Much better 04-21-11.   #7. Low K - replaced, check in am, mag stable.   Addendum  #8. Episode of Bradycardia 6.30pm= asymptomatic, SBP 130s, H rate >60 with activity, D/W Dr Fausto Skillern Cards agrees with DC Blocker, Cardizem with holding parameters, PRN Glucagon, Tele and BP monitor.    DVT Prophylaxis  SCDs  See all Orders from  today for further details  Daughter updated bedside 04-20-11   Leroy Sea M.D on 04/21/2011 at 8:42 AM  [facility] Office  [phone]

## 2011-04-21 NOTE — Progress Notes (Signed)
Bilateral carotid artery duplex completed.  Preliminary report is no evidence of significant ICA stenosis. Sara [name] 04/21/2011, 10:25 AM

## 2011-04-21 NOTE — Progress Notes (Signed)
04/21/2011 6:28 PM  Nursing Note:  Pt. Hr dipping down into 40's lowest at 38. Pt. Asymptomatic. bp 146/77. Md on call made aware. Orders received. Will continue to closely monitor patient HR, rhythm and BP.  Riki Gehring, Blanchard Kelch

## 2011-04-21 NOTE — Procedures (Signed)
EEG ID:  [id number].  HISTORY:  This is an 78 years old woman who came in with episodes of staring, aphasia subsequently and repetitive movements of her hands followed by confusion.  MEDICATIONS:  No anticonvulsant medications.  CONDITION OF RECORDING:  This 16-lead EEG was recorded with the patient in awake and drowsy states.  Background rhythms: background patterns in wakefulness were well organized with a well sustained posterior dominant rhythm of 7-8 Hz, symmetrical and reactive to eye opening and closing. Drowsiness was associated with mild attenuation in voltage and slowing frequencies.  Abnormal potentials: no epileptiform activity or focal slowing was noted.  ACTIVATION PROCEDURES:  Hyperventilation was not performed.  Photic stimulation was attempted, but could not be completed.  EKG:  Single-channel of EKG monitoring was unremarkable.  IMPRESSION:  This was an abnormal awake and drowsy EEG due to the presence of mild diffuse background slowing.  This finding may be suggestive of mild diffuse cerebral dysfunction and may be due to toxic metabolic encephalopathy, neurodegenerative disorder and/or bihemispheric structural abnormality.  Clinical correlation is suggested.          ______________________________ Carmell Austria, MD    [id number] D:  04/20/2011 20:02:56  T:  04/21/2011 01:37:34  Job #:  [id number]

## 2011-04-21 NOTE — Progress Notes (Signed)
Pt reports painful urination. Urine clear/dark yellow and has foul odor. Perineal area red/sour odor present. Cleansed perineum. And applied lotion to groin.

## 2011-04-22 LAB — GLUCOSE, CAPILLARY: Glucose-Capillary: 110 mg/dL — ABNORMAL HIGH (ref 70–99)

## 2011-04-22 LAB — POTASSIUM: Potassium: 3.7 mEq/L (ref 3.5–5.1)

## 2011-04-22 NOTE — Progress Notes (Addendum)
Sara [name] CSN:[id number],MRN:[id number] is a 78 y.o. female,  Outpatient Primary MD for the patient is Herb Grays, MD, MD  Chief Complaint  Patient presents with  . Transient Ischemic Attack        Subjective:   Sara [name] today has, No headache, No chest pain, No abdominal pain - No Nausea, No new weakness tingling or numbness, No Cough - SOB.   Objective:   Filed Vitals:   04/21/11 2055 04/22/11 0446 04/22/11 1042 04/22/11 1043  BP: 124/76 133/61 128/59   Pulse: 62 64  74  Temp: 98.7 F (37.1 C) 98.4 F (36.9 C)    TempSrc: Oral Oral    Resp: 20 20    Height:      Weight:      SpO2: 97% 95%      Wt Readings from Last 3 Encounters:  04/19/11 85.3 kg (188 lb 0.8 oz)  03/11/11 86.8 kg (191 lb 5.8 oz)  02/05/11 90.901 kg (200 lb 6.4 oz)     Intake/Output Summary (Last 24 hours) at 04/22/11 1159 Last data filed at 04/22/11 0819  Gross per 24 hour  Intake    720 ml  Output    200 ml  Net    520 ml    Exam Awake, Alert Oriented x 2, No new F.N deficits, Normal affect Broussard.AT,PERRAL Supple Neck,No JVD, No cervical lymphadenopathy appriciated.  Symmetrical Chest wall movement, Good air movement bilaterally, CTAB RRR,No Gallops,Rubs or new Murmurs, No Parasternal Heave +ve B.Sounds, Abd Soft, Non tender, No organomegaly appriciated, No rebound -guarding or rigidity. No Cyanosis, Clubbing or edema, No new Rash or bruise    Data Review  CBC  Lab 04/19/11 0600 04/18/11 1856  WBC 6.4 7.2  HGB 12.6 13.2  HCT 38.0 39.7  PLT 169 198  MCV 89.0 89.6  MCH 29.5 29.8  MCHC 33.2 33.2  RDW 14.4 14.5  LYMPHSABS -- --  MONOABS -- --  EOSABS -- --  BASOSABS -- --  BANDABS -- --    Chemistries   Lab 04/22/11 0842 04/21/11 0500 04/19/11 0600 04/18/11 1856  NA -- 143 143 140  K 3.7 3.2* 3.9 4.4  CL -- 106 105 104  CO2 -- 27 29 29   GLUCOSE -- 98 92 79  BUN -- 16 11 15   CREATININE -- 0.60 0.65 0.65  CALCIUM -- 8.8 9.1 9.9  MG -- 2.1 -- --  AST -- -- 14 --  ALT  -- -- 10 --  ALKPHOS -- -- 68 --  BILITOT -- -- 0.4 --   ------------------------------------------------------------------------------------------------------------------ estimated creatinine clearance is 54 ml/min (by C-G formula based on Cr of 0.6). ------------------------------------------------------------------------------------------------------------------ No results found for this basename: HGBA1C:2 in the last 72 hours ------------------------------------------------------------------------------------------------------------------ No results found for this basename: CHOL:2,HDL:2,LDLCALC:2,TRIG:2,CHOLHDL:2,LDLDIRECT:2 in the last 72 hours ------------------------------------------------------------------------------------------------------------------ No results found for this basename: TSH,T4TOTAL,FREET3,T3FREE,THYROIDAB in the last 72 hours ------------------------------------------------------------------------------------------------------------------ No results found for this basename: VITAMINB12:2,FOLATE:2,FERRITIN:2,TIBC:2,IRON:2,RETICCTPCT:2 in the last 72 hours  Coagulation profile No results found for this basename: INR:5,PROTIME:5 in the last 168 hours  No results found for this basename: DDIMER:2 in the last 72 hours  Cardiac Enzymes No results found for this basename: CK:3,CKMB:3,TROPONINI:3,MYOGLOBIN:3 in the last 168 hours ------------------------------------------------------------------------------------------------------------------ No components found with this basename: POCBNP:3  Micro Results Recent Results (from the past 240 hour(s))  MRSA PCR SCREENING     Status: Normal   Collection Time   04/19/11  1:03 AM      Component Value Range Status Comment  MRSA by PCR NEGATIVE  NEGATIVE  Final     Radiology Reports Dg Chest 2 View  04/19/2011  *RADIOLOGY REPORT*  Clinical Data: Stroke  CHEST - 2 VIEW  Comparison: 02/06/2010  Findings: Shallow inspiration.   Heart size and pulmonary vascularity are normal for technique.  Calcified and tortuous aorta.  Slight fibrosis in the lung bases.  No focal airspace consolidation.  Degenerative changes in the thoracic spine and shoulders.  Surgical clips in the left axilla.  Stable appearance since previous study.  IMPRESSION: Shallow inspiration.  No evidence of active pulmonary disease.  Original Report Authenticated By: Marlon Pel, M.D.   Ct Head Wo Contrast  04/18/2011  *RADIOLOGY REPORT*  Clinical Data: TIA.  Speech difficulty.  CT HEAD WITHOUT CONTRAST  Technique:  Contiguous axial images were obtained from the base of the skull through the vertex without contrast.  Comparison: 10/16/2009  Findings: The brain shows generalized atrophy.  There are extensive chronic small vessel changes throughout the hemispheric white matter.  No identifiably acute infarction, mass lesion, hemorrhage, hydrocephalus or extra-axial collection.  The calvarium is unremarkable.  Sinuses, middle ears and mastoids are clear.  IMPRESSION: No acute or reversible findings.  Chronic brain atrophy with extensive chronic small vessel changes throughout the hemispheric white matter.  Original Report Authenticated By: Thomasenia Sales, M.D.    MRI-A  MRI HEAD  Findings: No acute infarct.  Prominent small vessel disease type changes.  Global atrophy. Ventricular prominence probably related to atrophy  rather hydrocephalus.  No intracranial hemorrhage.  No intracranial mass lesion detected on this unenhanced exam.  Minimal to mild paranasal sinus mucosal thickening most notable  left sphenoid sinus air cells.  IMPRESSION:  No acute infarct.  Prominent small vessel disease type changes.  Global atrophy. Ventricular prominence probably related to atrophy  rather hydrocephalus.  Minimal to mild paranasal sinus mucosal thickening most notable  left sphenoid sinus air cells.  MRA HEAD  Findings: Ectatic distal vertical cervical  segment of the right  internal carotid artery.  Mild to moderate narrowing distal M1 segment left middle cerebral  artery.  Decreased number of visualized middle cerebral artery branches on  the right.  Middle cerebral artery branch vessel irregularity bilaterally.  Codominant vertebral arteries. Mild narrowing distal vertebral  arteries.  Nonvisualization right PICA.  Mild narrowing proximal basilar artery.  Nonvisualization left AICA. Narrowing proximal right AICA.  Moderate tandem stenosis superior cerebellar arteries more notable  on the right.  Moderate narrowing proximal left posterior cerebral artery. Mild  to moderate narrowing proximal right posterior cerebral artery.  Irregularity and mild narrowing of the posterior cerebral artery  distal branches.  No aneurysm noted.  IMPRESSION:  Intracranial atherosclerotic type changes as detailed above.   EEG EEG shows no epileptiform activity. In all likelihood, the event was a seizure, but please follow up CD results and call neurology for further input if there is significant (more than 50% stenosis on left). No treatment for first lifetime event. Follow up with Dr. Terrace Arabia. Call with questions  Carotids No significant extracranial carotid artery stenosis demonstrated. Vertebrals are patent with antegrade flow. Mild ECA stenosis noted on the left  Echo  - Left ventricle: The cavity size was normal. Wall thickness was normal. Systolic function was normal. The estimated ejection fraction was in the range of 55% to 60%. Wall motion was normal; there were no regional wall motion abnormalities. Left ventricular diastolic function parameters were normal. - Aortic valve: Trivial regurgitation. - Atrial  septum: No defect or patent foramen ovale was identified.     Scheduled Meds:    . aspirin  300 mg Rectal Daily   Or  . aspirin  325 mg Oral Daily  . darifenacin  7.5 mg Oral Daily  . diltiazem  120 mg Oral Daily  . donepezil  10  mg Oral Daily  . FLUoxetine  10 mg Oral Daily  . glipiZIDE  5 mg Oral Daily  . insulin aspart  0-9 Units Subcutaneous TID WC  . mulitivitamin with minerals  1 tablet Oral Daily  . niacin  1,000 mg Oral QHS  . omega-3 acid ethyl esters  1 g Oral Daily  . QUEtiapine  50 mg Oral Q1200  . simvastatin  5 mg Oral q1800  . DISCONTD: glucagon (GLUCAGEN) 5 MG IV  5 mg Intravenous Once  . DISCONTD: propranolol  60 mg Oral Daily   Continuous Infusions:  PRN Meds:.glucagon (GLUCAGEN) 5 MG IV, haloperidol lactate, senna-docusate, DISCONTD: glucagon  Assessment & Plan   #1. Brief episode of expressive aphasia which has resolved will have rule out TIA/CVA - neuro seeing the patient in consult. Stable stroke workup done including MRI/MRA of the brain, stable - carotid Doppler and 2-D echo,  patient to be in aspirin for now. PT-OT, Speech called. Stable A1c & lipid panel noted on statin. EEG stable. follow up with Dr. Terrace Arabia (her neurologist) post DC.   Lab Results  Component Value Date   HGBA1C 5.9* 04/19/2011     Lab Results  Component Value Date   CHOL 163 04/19/2011   HDL 29* 04/19/2011   LDLCALC 105* 04/19/2011   TRIG 147 04/19/2011   CHOLHDL 5.6 04/19/2011     #2. History of Paroxysmal atrial fibrillation not on Coumadin - presently patient is in sinus rhythm. We'll continue aspirin for now. Check  2-D echo. Patient has had a recent fall and had sustained a fracture of her wrist requiring surgery. Not sure if patient may be a candidate for Coumadin due to risk for fall.    #3. Headache - patient's daughter states she has chronic headache , ESR 8, outpt follow.   #4. History of breast cancer status post lumpectomy and radiation and 5 years of Arimidex - followed by Dr. Caron Presume.    #5. History of diabetes mellitus 2 - continue hold off metformin. Patient will be on sliding scale coverage.   CBG (last 3)   Basename 04/22/11 1131 04/22/11 0603 04/21/11 2114  GLUCAP 110* 139* 136*     #6.  Delirium - per daughter reoccurring problem, Seroquel scheduled, PRN haldol, QTc - continue monitoring fall-Asp precautions, bed alarm.Much better 04-21-11.   #7. Low K - replaced, check in am, mag stable.   #8. Episode of Bradycardia 6.30pm 04-21-11 = asymptomatic, SBP 130s, H rate >60 with activity, D/W Dr Fausto Skillern Cards stopped Blocker, Cardizem with holding parameters, PRN Glucagon, Tele and BP monitor. Stable now.    DVT Prophylaxis  SCDs  See all Orders from today for further details  Daughter updated bedside 04-20-11, called 3 times 04-22-11 not available.   Leroy Sea M.D on 04/22/2011 at 11:59 AM  [facility] Office  [phone]

## 2011-04-23 LAB — GLUCOSE, CAPILLARY

## 2011-04-23 MED ORDER — HALOPERIDOL 1 MG PO TABS: 1.0000 mg | ORAL_TABLET | Freq: Two times a day (BID) | ORAL | Status: AC | PRN

## 2011-04-23 MED ORDER — QUETIAPINE FUMARATE 50 MG PO TABS: 25.0000 mg | ORAL_TABLET | Freq: Every day | ORAL | Status: DC

## 2011-04-23 MED ORDER — ASPIRIN EC 325 MG PO TBEC: 325.0000 mg | DELAYED_RELEASE_TABLET | Freq: Every day | ORAL | Status: AC

## 2011-04-23 MED ORDER — ATORVASTATIN CALCIUM 10 MG PO TABS: 10.0000 mg | ORAL_TABLET | Freq: Every day | ORAL | Status: DC

## 2011-04-23 NOTE — Progress Notes (Signed)
Physical Therapy Treatment Patient Details Name: Sara [name] MRN: [id number] DOB: 06-02-1925 Today's Date: 04/23/2011  PT Assessment/Plan  PT - Assessment/Plan Comments on Treatment Session: Pt progressing well today with mobility and cognition. Pt's dgtr reports pt nearly back to baseline and encouraged continued mobility with family PT Plan: Discharge plan remains appropriate PT Goals  Acute Rehab PT Goals PT Goal: Supine/Side to Sit - Progress: Progressing toward goal PT Goal: Sit to Supine/Side - Progress: Progressing toward goal PT Goal: Sit to Stand - Progress: Met PT Goal: Stand to Sit - Progress: Met PT Goal: Stand - Progress: Progressing toward goal PT Goal: Ambulate - Progress: Progressing toward goal PT Goal: Up/Down Stairs - Progress: Progressing toward goal  PT Treatment Precautions/Restrictions  Precautions Precautions: Fall Restrictions Weight Bearing Restrictions: No Mobility (including Balance) Bed Mobility Bed Mobility: Yes Supine to Sit: 6: Modified independent (Device/Increase time);With rails;HOB elevated (Comment degrees) (30degrees) Sitting - Scoot to Edge of Bed: 6: Modified independent (Device/Increase time) Transfers Sit to Stand: 5: Supervision;From bed;From chair/3-in-1;From toilet Stand to Sit: 5: Supervision;To chair/3-in-1;To toilet Stand to Sit Details: cueing for hand placement and safety with transfers x 3 trials Ambulation/Gait Ambulation/Gait Assistance: 4: Min assist Ambulation/Gait Assistance Details (indicate cue type and reason): Min guard assist with constant cues to step into RW and extend trunk Ambulation Distance (Feet): 350 Feet Assistive device: Rolling walker Gait Pattern: Trunk flexed;Decreased stride length Stairs: Yes Stairs Assistance: 4: Min assist Stairs Assistance Details (indicate cue type and reason): cueing for sequence and safety Stair Management Technique: One rail Right;Other (comment) (hand held assist  LUE) Number of Stairs: 4  Height of Stairs: 8  Wheelchair Mobility Wheelchair Mobility: No    Exercise    End of Session PT - End of Session Equipment Utilized During Treatment: Gait belt Activity Tolerance: Patient tolerated treatment well Patient left: in chair;with call bell in reach;with family/visitor present Nurse Communication: Mobility status for transfers;Mobility status for ambulation General Behavior During Session: [facility] for tasks performed Cognition: Impaired, at baseline  [name] 04/23/2011, 11:59 AM Toney Sang, PT [phone]

## 2011-04-23 NOTE — Progress Notes (Signed)
Pt. Discharged 04/23/2011  12:20 PM Discharge instructions reviewed with patient/family. Patient/family verbalized understanding. All Rx's given. Questions answered as needed. Pt. Discharged to home with family/self. Taken off unit via W/C. Nyan Dufresne, [employer] rn

## 2011-04-23 NOTE — Discharge Summary (Signed)
Sara [name], 78 y.o., DOB 1925/05/25, MRN [id number]. Admission date: 04/18/2011 Discharge Date 04/23/2011 Primary MD Herb Grays, MD, MD Admitting Physician Leroy Sea, MD  Admission Diagnosis  TIA (transient ischemic attack) [435.9] tia  Discharge Diagnosis   Principal Problem:  *Expressive aphasia Active Problems:  History of breast cancer in female  Diabetes mellitus  HTN (hypertension)    Past Medical History  Diagnosis Date  . Cancer     breast - left  . Arthritis   . Diabetes mellitus   . Hypertension   . Generalized headaches   . Hearing loss   . Sore throat   . Confusion   . Weakness   . Mental disorder   . DEMENTIA   . Stroke 02-23-2011    recently diagnosed with mini-strokes    Past Surgical History  Procedure Date  . Back surgery     lower  . Vericose veins   . Abdominal hysterectomy   . Breast surgery   . Wrist surgery   . Cardiac catheterization   . Fracture surgery     left arm 01/2011  . Mastectomy     LEft breast with lymph node [facility] Course See H&P, Labs, Consult and Test reports for all details in brief, patient was admitted for    #1. Brief episode of expressive aphasia which has resolved ? TIA - seen by neuro  in consult. Stable stroke workup done including MRI/MRA of the brain, stable - carotid Doppler and 2-D echo, patient to be in aspirin for now. PT-OT, Speech seen. Stable A1c & lipid panel noted on statin. EEG stable. follow up with Dr. Terrace Arabia (her neurologist) post DC. Family did not want placement.   Lab Results   Component  Value  Date    HGBA1C  5.9*  04/19/2011    Lab Results   Component  Value  Date    CHOL  163  04/19/2011    HDL  29*  04/19/2011    LDLCALC  105*  04/19/2011    TRIG  147  04/19/2011    CHOLHDL  5.6  04/19/2011      #2. History of Paroxysmal atrial fibrillation not on Coumadin - presently patient is in sinus rhythm. We'll continue aspirin for now. Along with Cardizem, B blocker stopped due to  bradycardia episode few days ago, not a Coumadin candidate- ++ fall risk.  #3. History of breast cancer status post lumpectomy and radiation and 5 years of Arimidex - followed by Dr. Caron Presume.   #4. History of diabetes mellitus 2 - A1c 5.9, home Meds.   #5. Delirium - per daughter reoccurring problem, Seroquel scheduled, PRN haldol, QTc - continue monitoring fall-Asp precautions, 24 hr supervision at home per family - they refused placement.  #6. Low K - replaced  stable.   #7. Episode of Bradycardia 6.30pm 04-21-11 = asymptomatic, SBP 130s, H rate >60 with activity, D/W Dr Fausto Skillern Cards stopped Blocker, Cardizem tolerating well. Stable now.    Consults Neuro  Significant Tests:  See full reports for all details     Dg Chest 2 View  04/19/2011  *RADIOLOGY REPORT*  Clinical Data: Stroke  CHEST - 2 VIEW  Comparison: 02/06/2010  Findings: Shallow inspiration.  Heart size and pulmonary vascularity are normal for technique.  Calcified and tortuous aorta.  Slight fibrosis in the lung bases.  No focal airspace consolidation.  Degenerative changes in the thoracic spine and shoulders.  Surgical clips in  the left axilla.  Stable appearance since previous study.  IMPRESSION: Shallow inspiration.  No evidence of active pulmonary disease.  Original Report Authenticated By: Marlon Pel, M.D.   Ct Head Wo Contrast  04/18/2011  *RADIOLOGY REPORT*  Clinical Data: TIA.  Speech difficulty.  CT HEAD WITHOUT CONTRAST  Technique:  Contiguous axial images were obtained from the base of the skull through the vertex without contrast.  Comparison: 10/16/2009  Findings: The brain shows generalized atrophy.  There are extensive chronic small vessel changes throughout the hemispheric white matter.  No identifiably acute infarction, mass lesion, hemorrhage, hydrocephalus or extra-axial collection.  The calvarium is unremarkable.  Sinuses, middle ears and mastoids are clear.  IMPRESSION: No acute or reversible  findings.  Chronic brain atrophy with extensive chronic small vessel changes throughout the hemispheric white matter.  Original Report Authenticated By: Thomasenia Sales, M.D.   Mr Brain Wo Contrast  04/19/2011  *RADIOLOGY REPORT*  Clinical Data:  Right-sided weakness.  Some confusion.  MRI HEAD WITHOUT CONTRAST MRA HEAD WITHOUT CONTRAST  Technique: Multiplanar, multiecho pulse sequences of the brain and surrounding structures were obtained according to standard protocol without intravenous contrast.  Angiographic images of the head were obtained using MRA technique without contrast.  Comparison: 01/02 of 2013 head CT.  No comparison MR.  MRI HEAD  Findings:  No acute infarct.  Prominent small vessel disease type changes.  Global atrophy.  Ventricular prominence probably related to atrophy rather hydrocephalus.  No intracranial hemorrhage.  No intracranial mass lesion detected on this unenhanced exam.  Minimal to mild paranasal sinus mucosal thickening most notable left sphenoid sinus air cells.  IMPRESSION: No acute infarct.  Prominent small vessel disease type changes.  Global atrophy.  Ventricular prominence probably related to atrophy rather hydrocephalus.  Minimal to mild paranasal sinus mucosal thickening most notable left sphenoid sinus air cells.  MRA HEAD  Findings: Ectatic distal vertical cervical segment of the right internal carotid artery.  Mild to moderate narrowing distal M1 segment left middle cerebral artery.  Decreased number of visualized middle cerebral artery branches on the right.  Middle cerebral artery branch vessel irregularity bilaterally.  Codominant vertebral arteries.  Mild narrowing distal vertebral arteries.  Nonvisualization right PICA.  Mild narrowing proximal basilar artery.  Nonvisualization left AICA.  Narrowing proximal right AICA.  Moderate tandem stenosis superior cerebellar arteries more notable on the right.  Moderate narrowing proximal left posterior cerebral artery.  Mild  to moderate narrowing proximal right posterior cerebral artery. Irregularity and mild narrowing of the posterior cerebral artery distal branches.  No aneurysm noted.  IMPRESSION: Intracranial atherosclerotic type changes as detailed above.  Original Report Authenticated By: Fuller Canada, M.D.   Mr Mra Head/brain Wo Cm  04/19/2011  *RADIOLOGY REPORT*  Clinical Data:  Right-sided weakness.  Some confusion.  MRI HEAD WITHOUT CONTRAST MRA HEAD WITHOUT CONTRAST  Technique: Multiplanar, multiecho pulse sequences of the brain and surrounding structures were obtained according to standard protocol without intravenous contrast.  Angiographic images of the head were obtained using MRA technique without contrast.  Comparison: 01/02 of 2013 head CT.  No comparison MR.  MRI HEAD  Findings:  No acute infarct.  Prominent small vessel disease type changes.  Global atrophy.  Ventricular prominence probably related to atrophy rather hydrocephalus.  No intracranial hemorrhage.  No intracranial mass lesion detected on this unenhanced exam.  Minimal to mild paranasal sinus mucosal thickening most notable left sphenoid sinus air cells.  IMPRESSION: No acute infarct.  Prominent small vessel disease type changes.  Global atrophy.  Ventricular prominence probably related to atrophy rather hydrocephalus.  Minimal to mild paranasal sinus mucosal thickening most notable left sphenoid sinus air cells.  MRA HEAD  Findings: Ectatic distal vertical cervical segment of the right internal carotid artery.  Mild to moderate narrowing distal M1 segment left middle cerebral artery.  Decreased number of visualized middle cerebral artery branches on the right.  Middle cerebral artery branch vessel irregularity bilaterally.  Codominant vertebral arteries.  Mild narrowing distal vertebral arteries.  Nonvisualization right PICA.  Mild narrowing proximal basilar artery.  Nonvisualization left AICA.  Narrowing proximal right AICA.  Moderate tandem  stenosis superior cerebellar arteries more notable on the right.  Moderate narrowing proximal left posterior cerebral artery.  Mild to moderate narrowing proximal right posterior cerebral artery. Irregularity and mild narrowing of the posterior cerebral artery distal branches.  No aneurysm noted.  IMPRESSION: Intracranial atherosclerotic type changes as detailed above.  Original Report Authenticated By: Fuller Canada, M.D.    EEG  EEG shows no epileptiform activity. In all likelihood, the event was a seizure, but please follow up CD results and call neurology for further input if there is significant (more than 50% stenosis on left). No treatment for first lifetime event. Follow up with Dr. Terrace Arabia. Call with questions  Carotids  No significant extracranial carotid artery stenosis demonstrated. Vertebrals are patent with antegrade flow. Mild ECA stenosis noted on the left   Echo  - Left ventricle: The cavity size was normal. Wall thickness was normal. Systolic function was normal. The estimated ejection fraction was in the range of 55% to 60%. Wall motion was normal; there were no regional wall motion abnormalities. Left ventricular diastolic function parameters were normal. - Aortic valve: Trivial regurgitation. - Atrial septum: No defect or patent foramen ovale was identified.     Today   Subjective:   [name] today has no headache,no chest abdominal pain,no new weakness tingling or numbness, feels much better wants to go home today.    Objective:   Blood pressure 133/82, pulse 79, temperature 98 F (36.7 C), temperature source Oral, resp. rate 19, height 5\' 2"  (1.575 m), weight 85.3 kg (188 lb 0.8 oz), SpO2 96.00%.  Intake/Output Summary (Last 24 hours) at 04/23/11 0847 Last data filed at 04/22/11 1700  Gross per 24 hour  Intake    480 ml  Output      0 ml  Net    480 ml    Exam Awake Alert, Oriented *2, No new F.N deficits, Normal affect .AT,PERRAL Supple Neck,No JVD,  No cervical lymphadenopathy appriciated.  Symmetrical Chest wall movement, Good air movement bilaterally, CTAB RRR,No Gallops,Rubs or new Murmurs, No Parasternal Heave +ve B.Sounds, Abd Soft, Non tender, No organomegaly appriciated, No rebound -guarding or rigidity. No Cyanosis, Clubbing or edema, No new Rash or bruise  Data Review      CBC w Diff: Lab Results  Component Value Date   WBC 6.4 04/19/2011   WBC 8.1 08/09/2010   HGB 12.6 04/19/2011   HGB 12.5 08/09/2010   HCT 38.0 04/19/2011   HCT 36.8 08/09/2010   PLT 169 04/19/2011   PLT 209 08/09/2010   LYMPHOPCT 30 03/10/2011   LYMPHOPCT 22.3 08/09/2010   MONOPCT 8 03/10/2011   MONOPCT 6.6 08/09/2010   EOSPCT 7* 03/10/2011   EOSPCT 2.3 08/09/2010   BASOPCT 1 03/10/2011   BASOPCT 0.4 08/09/2010   CMP: Lab Results  Component Value Date  NA 143 04/21/2011   K 3.7 04/22/2011   CL 106 04/21/2011   CO2 27 04/21/2011   BUN 16 04/21/2011   CREATININE 0.60 04/21/2011   PROT 6.1 04/19/2011   ALBUMIN 3.0* 04/19/2011   BILITOT 0.4 04/19/2011   ALKPHOS 68 04/19/2011   AST 14 04/19/2011   ALT 10 04/19/2011  .  Micro Results Recent Results (from the past 240 hour(s))  MRSA PCR SCREENING     Status: Normal   Collection Time   04/19/11  1:03 AM      Component Value Range Status Comment   MRSA by PCR NEGATIVE  NEGATIVE  Final      Discharge Instructions     Follow with Primary MD Herb Grays, MD, MD in 7 days   Get CBC, CMP, checked 7 days by Primary MD and again as instructed by your Primary MD.   Get Medicines reviewed and adjusted.  Please request your Prim.MD to go over all Hospital Tests and Procedure/Radiological results at the follow up, please get all Hospital records sent to your Prim MD by signing hospital release before you go home.  Activity: Fall precautions use walker/cane & assistance as needed  Diet: Heart Healthy, Aspiration precautions.  For Heart failure patients - Check your Weight same time everyday, if you gain over 2 pounds, or you  develop in leg swelling, experience more shortness of breath or chest pain, call your Primary MD immediately. Follow Cardiac Low Salt Diet and 1.8 lit/day fluid restriction.  Disposition Home  If you experience worsening of your admission symptoms, develop shortness of breath, life threatening emergency, suicidal or homicidal thoughts you must seek medical attention immediately by calling 911 or calling your MD immediately  if symptoms less severe.  You Must read complete instructions/literature along with all the possible adverse reactions/side effects for all the Medicines you take and that have been prescribed to you. Take any new Medicines after you have completely understood and accpet all the possible adverse reactions/side effects.   Do not drive if your were admitted for syncope or siezures until you have seen by Primary MD or a Neurologist and advised to drive.    Follow-up Information    Follow up with Herb Grays, MD. Make an appointment in 1 week.      Follow up with Gates Rigg, MD. Make an appointment in 1 week.   Contact information:   [address] [facility] [place] [address] [phone]          Discharge Medications   Medication List  As of 04/23/2011  8:47 AM   START taking these medications         aspirin EC 325 MG tablet   Take 1 tablet (325 mg total) by mouth daily.      atorvastatin 10 MG tablet   Commonly known as: LIPITOR   Take 1 tablet (10 mg total) by mouth daily.      haloperidol 1 MG tablet   Commonly known as: HALDOL   Take 1 tablet (1 mg total) by mouth 2 (two) times daily as needed (agitation).      QUEtiapine 50 MG tablet   Commonly known as: SEROQUEL   Take 0.5 tablets (25 mg total) by mouth at bedtime.         CONTINUE taking these medications         acetaminophen 325 MG tablet   Commonly known as: TYLENOL      calcium  carbonate 200 MG capsule      diltiazem 120 MG 24 hr  capsule   Commonly known as: CARDIZEM CD      donepezil 5 MG tablet   Commonly known as: ARICEPT      FLUoxetine 10 MG capsule   Commonly known as: PROZAC      glipiZIDE 5 MG 24 hr tablet   Commonly known as: GLUCOTROL XL      KLOR-CON M20 20 MEQ tablet   Generic drug: potassium chloride SA      metFORMIN 500 MG tablet   Commonly known as: GLUCOPHAGE      multivitamin capsule      niacin 500 MG tablet      OMEGA-3 FISH OIL PO      VESICARE 5 MG tablet   Generic drug: solifenacin         STOP taking these medications         propranolol 60 MG tablet          Where to get your medications    These are the prescriptions that you need to pick up.   You may get these medications from any pharmacy.         aspirin EC 325 MG tablet   atorvastatin 10 MG tablet   haloperidol 1 MG tablet   QUEtiapine 50 MG tablet             Total Time in preparing paper work, data evaluation and todays exam - 35 minutes  Leroy Sea M.D on 04/23/2011 at 8:47 AM  [facility] Office  [phone]

## 2011-04-23 NOTE — Progress Notes (Signed)
PT IS TO DC TODAY WITH HH PT/OT/ AIDE AND RN FROM [facility]. Willa Rough 04/23/2011 [phone] OR [phone]

## 2011-07-30 ENCOUNTER — Ambulatory Visit: Payer: Medicare Other

## 2011-09-17 ENCOUNTER — Ambulatory Visit
Admission: RE | Admit: 2011-09-17 | Discharge: 2011-09-17 | Disposition: A | Discharge status: Home / Self Care | Payer: Medicare Other | Source: Ambulatory Visit | Attending: Oncology | Admitting: Oncology

## 2011-09-17 DIAGNOSIS — Z1231 Encounter for screening mammogram for malignant neoplasm of breast: Secondary | ICD-10-CM

## 2012-01-01 ENCOUNTER — Encounter (INDEPENDENT_AMBULATORY_CARE_PROVIDER_SITE_OTHER): Payer: Medicare Other

## 2012-01-01 ENCOUNTER — Other Ambulatory Visit: Payer: Self-pay | Admitting: *Deleted

## 2012-01-01 DIAGNOSIS — M79609 Pain in unspecified limb: Secondary | ICD-10-CM

## 2012-01-01 DIAGNOSIS — M7989 Other specified soft tissue disorders: Secondary | ICD-10-CM

## 2012-02-13 ENCOUNTER — Encounter (INDEPENDENT_AMBULATORY_CARE_PROVIDER_SITE_OTHER): Payer: Self-pay | Admitting: General Surgery

## 2012-03-14 IMAGING — CR DG KNEE AP/LAT W/ SUNRISE*L*
4 series · 4 of 4 positions shown · non-contrast
Comparison: 12/22/2010

CLINICAL DATA: Fall, knee swelling

DG KNEE - 3 VIEWS

[x knee ap left]
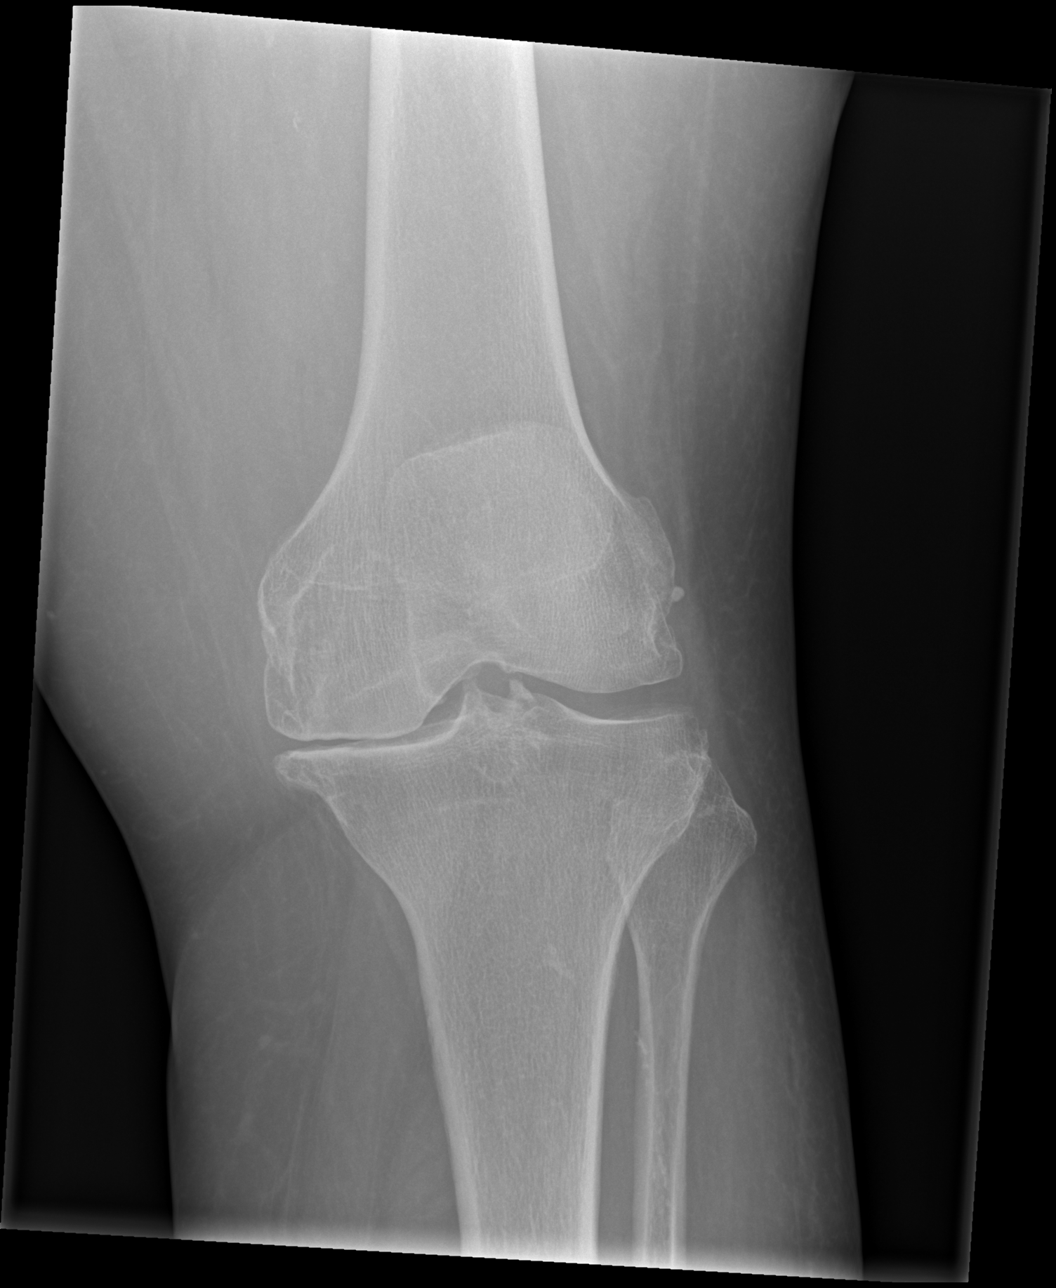

[x knee lat left (1 of 2)]
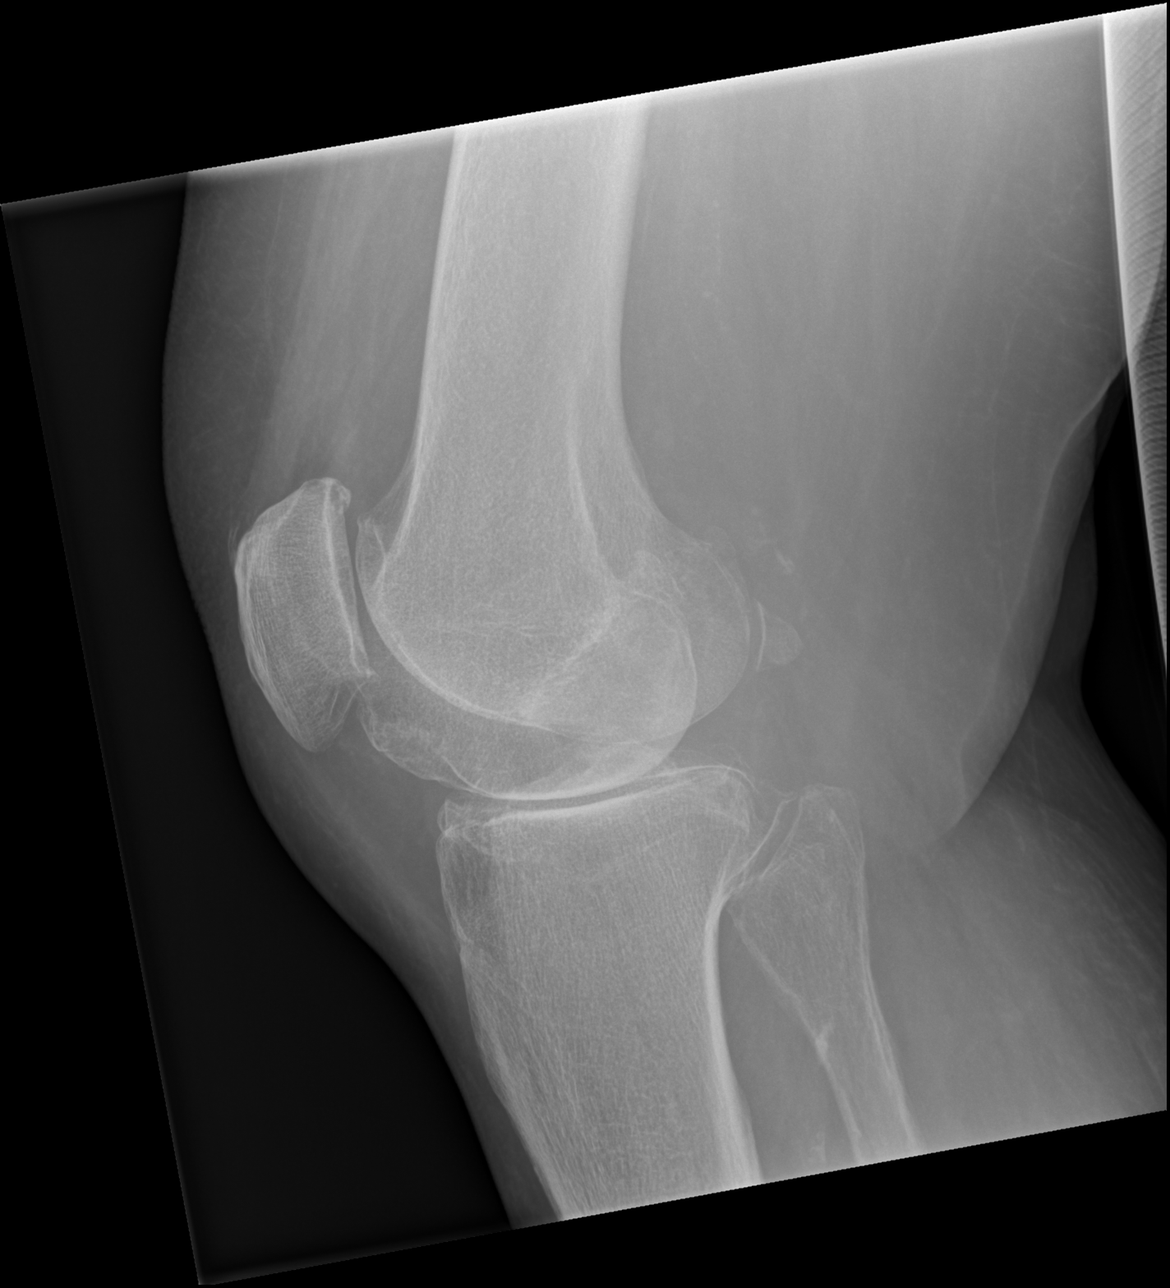

[x knee lat left (2 of 2)]
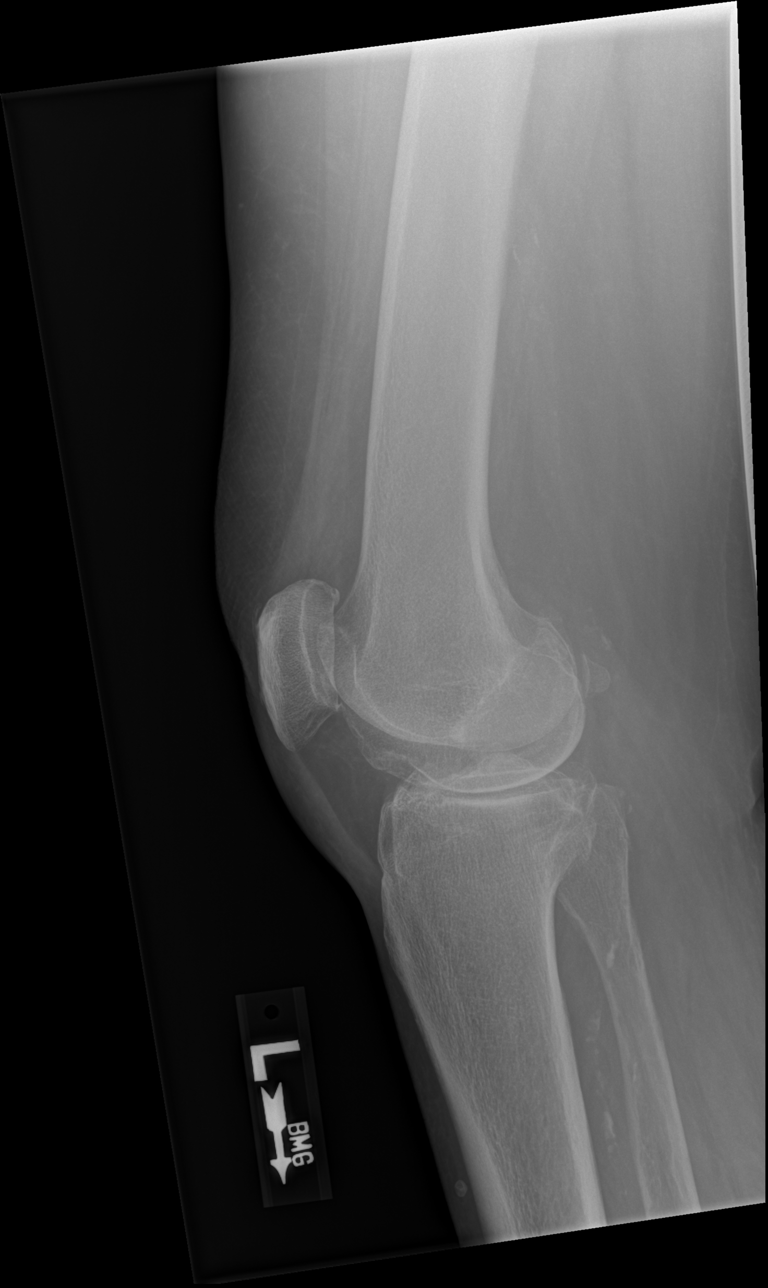

[x knee patella left]
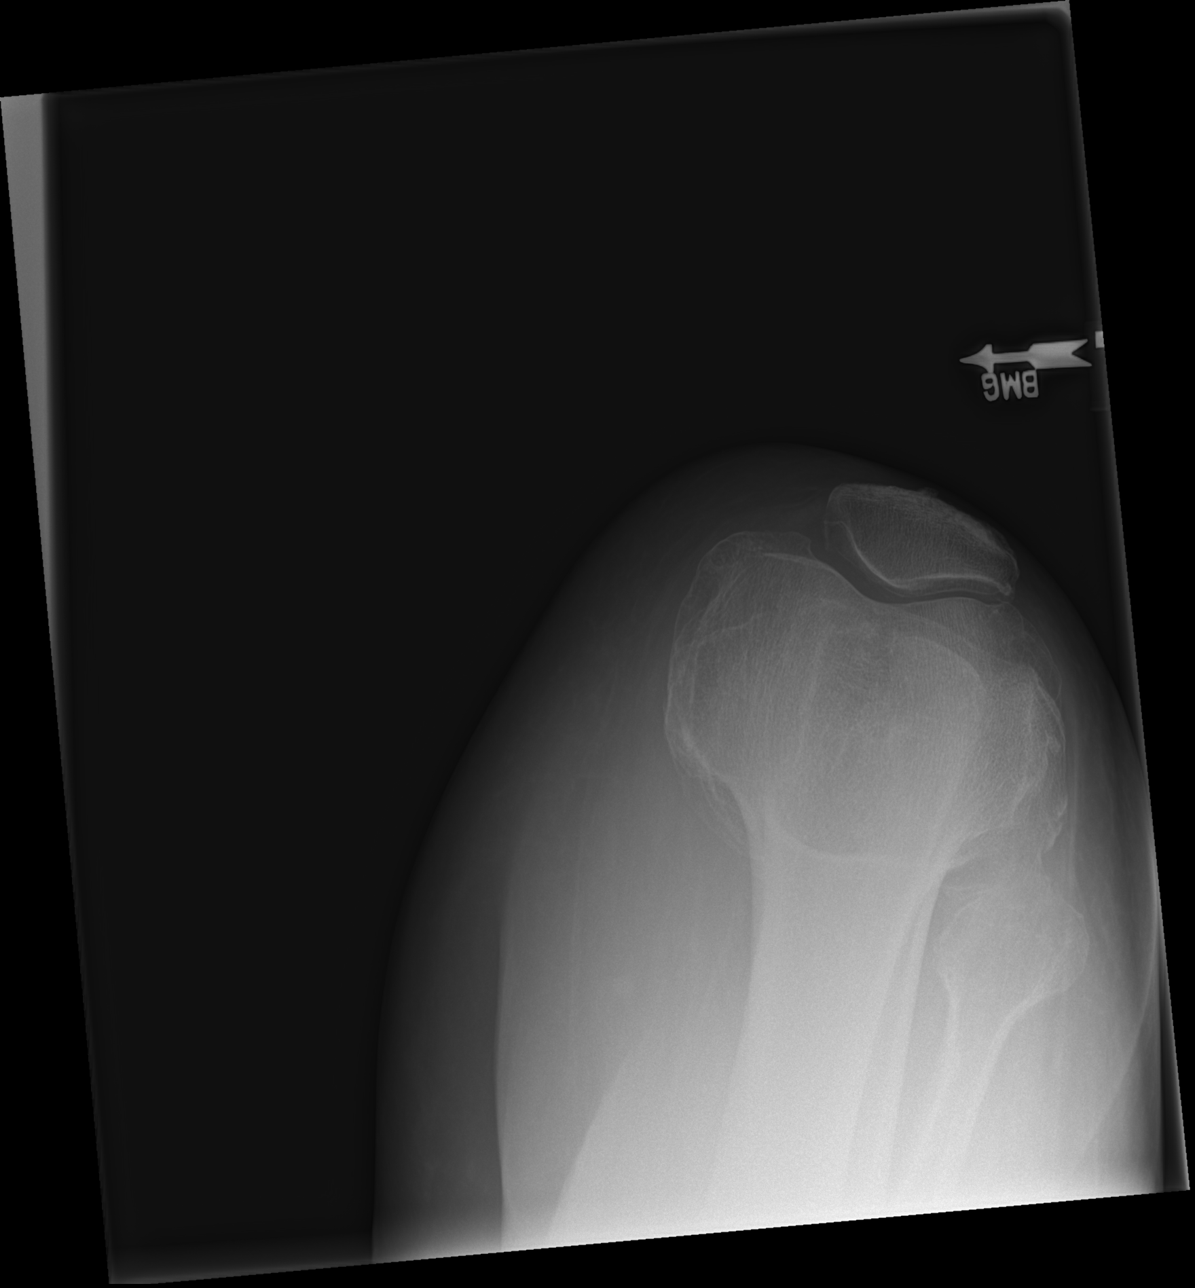

[4 of 4 positions shown; findings below may reference images not displayed]

FINDINGS: Moderate to severe tricompartmental degenerative change
again identified.  Trace suprapatellar fluid noted.  The patella
tracks appropriately in the patellofemoral groove.  No fracture or
dislocation.  Vascular calcification noted.
IMPRESSION: Moderate to severe tricompartmental degenerative change.  No acute
finding.

## 2012-09-29 ENCOUNTER — Other Ambulatory Visit: Payer: Self-pay

## 2012-09-29 DIAGNOSIS — Z1231 Encounter for screening mammogram for malignant neoplasm of breast: Secondary | ICD-10-CM

## 2012-10-10 ENCOUNTER — Telehealth: Payer: Self-pay | Admitting: *Deleted

## 2012-10-10 NOTE — Telephone Encounter (Signed)
Chart reviewed, I have called her grandson Mardelle Matte, okay to skip them and that it is too costly, she should continue generic donepezil continue followup,

## 2012-10-29 ENCOUNTER — Ambulatory Visit
Admission: RE | Admit: 2012-10-29 | Discharge: 2012-10-29 | Disposition: A | Discharge status: Home / Self Care | Payer: Medicare Other | Source: Ambulatory Visit

## 2012-10-29 DIAGNOSIS — Z1231 Encounter for screening mammogram for malignant neoplasm of breast: Secondary | ICD-10-CM

## 2012-12-04 ENCOUNTER — Other Ambulatory Visit: Payer: Self-pay | Admitting: Neurology

## 2012-12-05 ENCOUNTER — Encounter: Payer: Self-pay | Admitting: *Deleted

## 2012-12-08 ENCOUNTER — Encounter: Payer: Self-pay | Admitting: Internal Medicine

## 2012-12-09 ENCOUNTER — Encounter: Payer: Self-pay | Admitting: Internal Medicine

## 2012-12-09 ENCOUNTER — Ambulatory Visit (INDEPENDENT_AMBULATORY_CARE_PROVIDER_SITE_OTHER): Payer: Medicare Other | Admitting: Internal Medicine

## 2012-12-09 VITALS — BP 140/78 | HR 61 | Ht 60.0 in | Wt 172.5 lb

## 2012-12-09 DIAGNOSIS — W19XXXS Unspecified fall, sequela: Secondary | ICD-10-CM

## 2012-12-09 DIAGNOSIS — I4891 Unspecified atrial fibrillation: Secondary | ICD-10-CM

## 2012-12-09 DIAGNOSIS — I480 Paroxysmal atrial fibrillation: Secondary | ICD-10-CM

## 2012-12-09 DIAGNOSIS — I10 Essential (primary) hypertension: Secondary | ICD-10-CM

## 2012-12-09 DIAGNOSIS — F0390 Unspecified dementia without behavioral disturbance: Secondary | ICD-10-CM

## 2012-12-09 NOTE — Patient Instructions (Signed)
Your physician has recommended you make the following change in your medication: STOP LIPITOR.  Your physician wants you to follow-up in: 1 year. You will receive a reminder letter in the mail two months in advance. If you don't receive a letter, please call our office to schedule the follow-up appointment.

## 2012-12-09 NOTE — Progress Notes (Signed)
OFFICE NOTE  Chief Complaint:  Routine followup  Primary Care Physician: Herb Grays, MD  HPI:  Sara [name] is an 79 year old female who has had recently a number of falls due to imbalance and a history of dementia with some worsening memory loss. She had a history of paroxysmal atrial fibrillation which was very short lived and converted, possibly thought to be in the setting of acute cholecystitis. She has been on aspirin alone due to her falls and is not felt to be a good Coumadin candidate. In the past, she had been on Lasix up to 40 mg daily. This was, however, decreased due to excessive urination and eventually stopped. Today, she does have some lower extremity swelling which she has noted occurs mostly throughout the day but disappears somewhat at night with elevation in her feet. Also diabetes. Overall she's been doing fairly well although has had some worsening memory loss and has had a significant 30-40 pound weight loss. She subsequently come off of her diabetes medicines and potentially could come off of her cholesterol medicine. There is possibly some concern that this could be causing memory falls or confusion, and I don't see much benefit in such a low dose for primary prevention of cardiac disease. She is on low-dose blood pressure medication which seems to be appropriate still at this time. Her blood pressure is fairly well-controlled today.  PMHx:  Past Medical History  Diagnosis Date  . Cancer     breast - left  . Arthritis   . Diabetes mellitus     type 2  . Hypertension   . Generalized headaches   . Hearing loss   . Sore throat   . Confusion   . Weakness   . Mental disorder   . DEMENTIA   . Stroke 02-23-2011    recently diagnosed with mini-strokes  . PAF (paroxysmal atrial fibrillation)     remote history, thought to be r/t acute cholecystits  . Frequent falls   . Dyslipidemia   . RBBB   . History of nuclear stress test 2000    persantine; low risk scan      Past Surgical History  Procedure Laterality Date  . Back surgery      lower  . Vericose veins    . Abdominal hysterectomy    . Breast surgery    . Wrist surgery    . Cardiac catheterization  02/07/2010    mild CAD  . Fracture surgery  01/2011    left arm   . Mastectomy      Left breast with lymph node removeal  . Laparoscopic cholecystectomy  02/11/2010  . Transthoracic echocardiogram  04/20/2011    EF 55-60%; mildly thickened AV leaflets;     FAMHx:  Family History  Problem Relation Age of Onset  . Diabetes type II Son     SOCHx:   reports that she has never smoked. She has never used smokeless tobacco. She reports that she does not drink alcohol or use illicit drugs.  ALLERGIES:  Allergies  Allergen Reactions  . Codeine Nausea Only and Other (See Comments)    In addition, most pain medications cause confusion, make patient light headed.    ROS: A comprehensive review of systems was negative.  HOME MEDS: Current Outpatient Prescriptions  Medication Sig Dispense Refill  . acetaminophen (TYLENOL) 325 MG tablet Take 650 mg by mouth every 4 (four) hours as needed. pain      . aspirin 81 MG tablet  Take 81 mg by mouth daily.      . calcium carbonate 200 MG capsule Take 250 mg by mouth daily.        . Calcium Carbonate Antacid (MAALOX PO) Take by mouth as needed.      . diltiazem (CARDIZEM CD) 120 MG 24 hr capsule Take 120 mg by mouth daily.       Marland Kitchen donepezil (ARICEPT) 10 MG tablet TAKE 1 TABLET BY MOUTH EVERY DAY  30 tablet  1  . KLOR-CON M20 20 MEQ tablet Take 20 mEq by mouth daily.       . Memantine HCl (NAMENDA PO) Take by mouth 2 (two) times daily.      . Multiple Vitamin (MULTIVITAMIN) capsule Take 1 capsule by mouth daily.        . niacin 500 MG tablet Take 1,000 mg by mouth at bedtime.       . Omega-3 Fatty Acids (OMEGA-3 FISH OIL PO) Take 1 capsule by mouth daily.       . QUEtiapine (SEROQUEL) 25 MG tablet Take 25 mg by mouth daily.      . VESICARE 5 MG  tablet Take 5 mg by mouth 2 (two) times daily.        No current facility-administered medications for this visit.    LABS/IMAGING: No results found for this or any previous visit (from the past 48 hour(s)). No results found.  VITALS: BP 140/78  Pulse 61  Ht 5' (1.524 m)  Wt 172 lb 8 oz (78.245 kg)  BMI 33.69 kg/m2  EXAM: General appearance: alert and no distress Neck: no adenopathy, no carotid bruit, no JVD, supple, symmetrical, trachea midline and thyroid not enlarged, symmetric, no tenderness/mass/nodules Lungs: clear to auscultation bilaterally Heart: regular rate and rhythm, S1, S2 normal, no murmur, click, rub or gallop Abdomen: soft, non-tender; bowel sounds normal; no masses,  no organomegaly and obese Extremities: extremities normal, atraumatic, no cyanosis or edema Pulses: 2+ and symmetric Skin: Skin color, texture, turgor normal. No rashes or lesions Neurologic: Grossly normal  EKG: Normal sinus rhythm at 61, right bundle branch  ASSESSMENT: 1. HTN 2. Hyperlipidemia 3. RBBB 4. Paroxysmal a-fib, not on warfarin due to fall risk 5. Dementia, with anorexia and weight loss  PLAN: 1.   Sara [name] is not have any recurrent atrial fibrillation from what I can tell. She is managed on aspirin do with concerns about falls and prior history of falls.  She does have dementia and is on medication for this. She's recently had significant weight loss and decreased appetite, which is certainly concerning. She's come off of her diabetes medications and I think can be safely taken off of her cholesterol medicine. He'll be interesting to see if this is been contributing to some of her memory loss or not. I would continue her on her current blood pressure medicine unless her blood pressure is below is 100 systolic, at which time I would work at discontinuing or decreasing that medication. Will plan to see her back annually otherwise.  Chrystie Nose, MD, [facility] Attending  Cardiologist [facility]  HILTY,Kenneth C 12/09/2012, 4:22 PM

## 2013-01-10 ENCOUNTER — Encounter: Payer: Self-pay | Admitting: Neurology

## 2013-01-12 ENCOUNTER — Ambulatory Visit (INDEPENDENT_AMBULATORY_CARE_PROVIDER_SITE_OTHER): Payer: Medicare Other | Admitting: Neurology

## 2013-01-12 ENCOUNTER — Encounter: Payer: Self-pay | Admitting: Neurology

## 2013-01-12 VITALS — BP 127/78 | HR 71 | Ht 61.0 in | Wt 175.0 lb

## 2013-01-12 DIAGNOSIS — L0291 Cutaneous abscess, unspecified: Secondary | ICD-10-CM

## 2013-01-12 DIAGNOSIS — E119 Type 2 diabetes mellitus without complications: Secondary | ICD-10-CM

## 2013-01-12 DIAGNOSIS — E669 Obesity, unspecified: Secondary | ICD-10-CM

## 2013-01-12 DIAGNOSIS — E1169 Type 2 diabetes mellitus with other specified complication: Secondary | ICD-10-CM

## 2013-01-12 DIAGNOSIS — I10 Essential (primary) hypertension: Secondary | ICD-10-CM

## 2013-01-12 DIAGNOSIS — R413 Other amnesia: Secondary | ICD-10-CM

## 2013-01-12 NOTE — Progress Notes (Signed)
History of Present Illness  HPI: Sara [name]  is a 79 year old right-handed Caucasian female, accompanied by her daughter, referred by primary care physician for evaluation of short-term memory trouble  She has past medical history of hypertension, diabetes, CAT scan of the brain without contrast has demonstrated atrophy, modereate periventricular small vessel disease,  She had 12 years of education, worked at [employer], retired in 1989, has been active, widowed 12 years ago, still driving, daughter noticed she has gradual onset of memory trouble over past two years, also because of her chronic low back pain knee pain, has become sedentary, she fell a month ago, with left wrist fracture, status post surgical fixation, she had increased forgetfulness, occasionally confusion,  She still able to drive short distance, daughter helps her with bills now, she tends to make mistakes, she can still use her phone live independently, there is no family history of dementia or, she has 3 brothers and 3 sisters,    Her mother died of congestive heart failure at age 54, her father died of gastric ulcer at age 31, besides this TIA episode, there was no history of stroke like symptoms, there was no sudden stepwise decline of her mentation.  She was taken to [facility] emergency room in April 18 2011, she was noticed to have sudden onset language difficulty, sounds like paraphasic errors, no loss of consciousness, no limb muscle weakness, lasting 5 minutes, she had extensive evaluation, MRI of the brain has demonstrate extensive white matter disease, MRA of the brain has demonstrate atherosclerotic disease, echocardiogram was normal, ejection fraction 55-60%, ultrasound of carotid artery no significant carotid artery stenosis,  She is now back to her baseline, daughter reported mild improvement with donepezil, she is less confused, she was also givenseroquel 25 mg every night, which helps her sleep, otherwise  she has difficulty falling sleep, sometimes hear people calling her name, baby crying  UPDATE Sept 29th 2014:  She still lives alone, overall doing very well, she is taking donepezil 10 mg every day, Namenda 10 mg twice a day, daughter reported financial burden was Namenda years, she is sleeping well, continue to take seroquel 25 mg every day,  Review of Systems: Out of a complete 14 system review, the patient complains of only the following symptoms, and all other reviewed systems are negative.  Weight loss, fatigue, hearing loss, incontinence, easy bruising, feeling cold, joint pain, achy muscles, running nose, memory loss, confusion, depression, decreased energy, change in appetite, disinterested in activities   Social History  Patient is retired and lives at home alone. Patient has a high school education. Patient has three children. Patient denies any history of tobacco, alcohol, illicit drugs. Patient does drink cafeine.  Inhaled Tobacco Use: never smoker  Family History  Patients parents are both deaceased.  Past Medical History  High blood pressure Diabetes Cancer  Surgical History  Back surg 1975 and 2011 Breast cancer--2000 Left wrist 2012 Gall bladder--2011    Physical Exam  Neck: supple no carotid bruits Respiratory: clear to auscultation bilaterally Cardiovascular: regular rate rhythm  Neurologic Exam  Mental Status: tired looking elderly female, awake, alert, cooperative to history, talking, and casual conversation.MMSE 27/30, she missed 3/3 recalls.  Cranial Nerves: CN II-XII pupils were equal round reactive to light.    Extraocular movements were full.  Visual fields were full on confrontational test.  Facial sensation and strength were normal.  Hearing was intact to finger rubbing bilaterally.  Uvula tongue were midline.  Head turning and  shoulder shrugging were normal and symmetric.  Tongue protrusion into the cheeks strength were normal.  Motor: Normal tone,  bulk, and strength, Sensory: intact to light touch. Coordination: Normal finger-to-nose, heel-to-shin.  There was no dysmetria noticed. Gait and Station: need to push up from seated position, cautious, limp due to knee and low back pain,  difficult to do tandem, tiptoe or heel walking Reflexes: Deep tendon reflexes: hypoactive and symmetric, Achilles: absent.  Plantar responses are flexor.   Assessment and Plan:  Sara [name] is a 79 year old right-handed Caucasian female with past medical history of hypertension,-diabetes, obesity, sedentary life style, extensive small vessel disease on MRI scan of the brain, now presenting with 2 years history of progressively worsening functional status, short-term memory trouble, Mini-Mental Status examination is 27/30  1. Mild cognitive impairment. 2.. continue donepezil 10 mg every morning,  3. May taper off namenda becaused of financial burden, if they are worsening of her symptoms, I have advised her daughter to call back, we may consider Namenda  xr 28 mg once a day 4. RTC in 12 month with Sara [name]

## 2013-01-20 ENCOUNTER — Other Ambulatory Visit: Payer: Self-pay | Admitting: Neurology

## 2013-02-07 ENCOUNTER — Other Ambulatory Visit: Payer: Self-pay | Admitting: Neurology

## 2013-02-08 ENCOUNTER — Other Ambulatory Visit: Payer: Self-pay

## 2013-02-08 MED ORDER — DONEPEZIL HCL 10 MG PO TABS: 10.0000 mg | ORAL_TABLET | Freq: Every day | ORAL | Status: DC

## 2013-07-18 ENCOUNTER — Other Ambulatory Visit: Payer: Self-pay | Admitting: Cardiology

## 2013-07-20 NOTE — Telephone Encounter (Signed)
Rx was sent to pharmacy electronically. 

## 2013-09-06 ENCOUNTER — Other Ambulatory Visit: Payer: Self-pay | Admitting: Neurology

## 2013-09-07 ENCOUNTER — Other Ambulatory Visit: Payer: Self-pay

## 2013-09-07 MED ORDER — DONEPEZIL HCL 10 MG PO TABS: 10.0000 mg | ORAL_TABLET | Freq: Every day | ORAL | Status: DC

## 2013-09-30 ENCOUNTER — Other Ambulatory Visit: Payer: Self-pay

## 2013-09-30 DIAGNOSIS — Z1231 Encounter for screening mammogram for malignant neoplasm of breast: Secondary | ICD-10-CM

## 2013-10-30 ENCOUNTER — Ambulatory Visit
Admission: RE | Admit: 2013-10-30 | Discharge: 2013-10-30 | Disposition: A | Discharge status: Home / Self Care | Payer: Medicare Other | Source: Ambulatory Visit

## 2013-10-30 DIAGNOSIS — Z1231 Encounter for screening mammogram for malignant neoplasm of breast: Secondary | ICD-10-CM

## 2013-11-28 ENCOUNTER — Emergency Department (HOSPITAL_COMMUNITY): Payer: Medicare Other

## 2013-11-28 ENCOUNTER — Encounter (HOSPITAL_COMMUNITY): Payer: Self-pay | Admitting: Emergency Medicine

## 2013-11-28 DIAGNOSIS — W010XXA Fall on same level from slipping, tripping and stumbling without subsequent striking against object, initial encounter: Secondary | ICD-10-CM | POA: Diagnosis not present

## 2013-11-28 DIAGNOSIS — I10 Essential (primary) hypertension: Secondary | ICD-10-CM | POA: Insufficient documentation

## 2013-11-28 DIAGNOSIS — S0003XA Contusion of scalp, initial encounter: Secondary | ICD-10-CM | POA: Insufficient documentation

## 2013-11-28 DIAGNOSIS — W1809XA Striking against other object with subsequent fall, initial encounter: Secondary | ICD-10-CM | POA: Diagnosis not present

## 2013-11-28 DIAGNOSIS — S0990XA Unspecified injury of head, initial encounter: Secondary | ICD-10-CM | POA: Diagnosis present

## 2013-11-28 DIAGNOSIS — H9190 Unspecified hearing loss, unspecified ear: Secondary | ICD-10-CM | POA: Diagnosis not present

## 2013-11-28 DIAGNOSIS — E119 Type 2 diabetes mellitus without complications: Secondary | ICD-10-CM | POA: Insufficient documentation

## 2013-11-28 DIAGNOSIS — Z7982 Long term (current) use of aspirin: Secondary | ICD-10-CM | POA: Insufficient documentation

## 2013-11-28 DIAGNOSIS — E785 Hyperlipidemia, unspecified: Secondary | ICD-10-CM | POA: Insufficient documentation

## 2013-11-28 DIAGNOSIS — Y9301 Activity, walking, marching and hiking: Secondary | ICD-10-CM | POA: Diagnosis not present

## 2013-11-28 DIAGNOSIS — S8000XA Contusion of unspecified knee, initial encounter: Secondary | ICD-10-CM | POA: Insufficient documentation

## 2013-11-28 DIAGNOSIS — M129 Arthropathy, unspecified: Secondary | ICD-10-CM | POA: Diagnosis not present

## 2013-11-28 DIAGNOSIS — Z8673 Personal history of transient ischemic attack (TIA), and cerebral infarction without residual deficits: Secondary | ICD-10-CM | POA: Diagnosis not present

## 2013-11-28 DIAGNOSIS — I4891 Unspecified atrial fibrillation: Secondary | ICD-10-CM | POA: Insufficient documentation

## 2013-11-28 DIAGNOSIS — IMO0002 Reserved for concepts with insufficient information to code with codable children: Secondary | ICD-10-CM | POA: Insufficient documentation

## 2013-11-28 DIAGNOSIS — Z23 Encounter for immunization: Secondary | ICD-10-CM | POA: Diagnosis not present

## 2013-11-28 DIAGNOSIS — Z859 Personal history of malignant neoplasm, unspecified: Secondary | ICD-10-CM | POA: Insufficient documentation

## 2013-11-28 DIAGNOSIS — S0083XA Contusion of other part of head, initial encounter: Principal | ICD-10-CM | POA: Insufficient documentation

## 2013-11-28 DIAGNOSIS — Z79899 Other long term (current) drug therapy: Secondary | ICD-10-CM | POA: Insufficient documentation

## 2013-11-28 DIAGNOSIS — Y9289 Other specified places as the place of occurrence of the external cause: Secondary | ICD-10-CM | POA: Insufficient documentation

## 2013-11-28 DIAGNOSIS — F0390 Unspecified dementia without behavioral disturbance: Secondary | ICD-10-CM | POA: Diagnosis not present

## 2013-11-28 DIAGNOSIS — S1093XA Contusion of unspecified part of neck, initial encounter: Principal | ICD-10-CM

## 2013-11-28 NOTE — ED Notes (Signed)
Patient presents stating she was watering her flowers on the car port.  She slipped and fell hitting her forehead and right knee.  Hematoma to forehead and swelling to the right knee

## 2013-11-29 ENCOUNTER — Emergency Department (HOSPITAL_COMMUNITY)
Admission: EM | Admit: 2013-11-29 | Discharge: 2013-11-29 | Disposition: A | Discharge status: Home / Self Care | Payer: Medicare Other | Attending: Emergency Medicine | Admitting: Emergency Medicine

## 2013-11-29 DIAGNOSIS — S1093XA Contusion of unspecified part of neck, initial encounter: Secondary | ICD-10-CM | POA: Diagnosis not present

## 2013-11-29 DIAGNOSIS — IMO0002 Reserved for concepts with insufficient information to code with codable children: Secondary | ICD-10-CM

## 2013-11-29 DIAGNOSIS — S8001XA Contusion of right knee, initial encounter: Secondary | ICD-10-CM

## 2013-11-29 DIAGNOSIS — S0093XA Contusion of unspecified part of head, initial encounter: Secondary | ICD-10-CM

## 2013-11-29 DIAGNOSIS — S0003XA Contusion of scalp, initial encounter: Secondary | ICD-10-CM | POA: Diagnosis not present

## 2013-11-29 DIAGNOSIS — T148XXA Other injury of unspecified body region, initial encounter: Secondary | ICD-10-CM

## 2013-11-29 MED ORDER — TETANUS-DIPHTHERIA TOXOIDS TD 5-2 LFU IM INJ
0.5000 mL | INJECTION | Freq: Once | INTRAMUSCULAR | Status: AC
Start: 2013-11-29 — End: 2013-11-29
  Administered 2013-11-29: 0.5 mL via INTRAMUSCULAR
  Filled 2013-11-29: qty 0.5

## 2013-11-29 MED ORDER — ACETAMINOPHEN 325 MG PO TABS
650.0000 mg | ORAL_TABLET | Freq: Once | ORAL | Status: AC
  Administered 2013-11-29: 650 mg via ORAL
  Filled 2013-11-29: qty 2

## 2013-11-29 NOTE — ED Provider Notes (Signed)
CSN: [id number]     Arrival date & time 11/28/13  2125 History   First MD Initiated Contact with Patient 11/29/13 0040     Chief Complaint  Patient presents with  . Fall     (Consider location/radiation/quality/duration/timing/severity/associated sxs/prior Treatment) HPI  This is an elderly woman with multiple chronic medical problems. She comes into the emergency department after a mechanical fall. The fall occurred around 7 PM as she was walking inside after doing some yard work. She says she tripped while walking into her car port. She fell forward and struck her head. However, she did not lose consciousness. She has a mild frontal headache. She is noted to have an abrasion. She can't recall the date of her last tetanus. She also has right knee pain and has noticed some right knee swelling. Pain localizes to the medial aspect of the joint. The patient has been able to bear weight but has not attempted to ambulate without assistance. She denies pain or injury to any other area. She is accompanied by 2 family members who state that her mental status is normal.    Past Medical History  Diagnosis Date  . Cancer     breast - left  . Arthritis   . Diabetes mellitus     type 2  . Hypertension   . Generalized headaches   . Hearing loss   . Sore throat   . Confusion   . Weakness   . Mental disorder   . DEMENTIA   . Stroke 02-23-2011    recently diagnosed with mini-strokes  . PAF (paroxysmal atrial fibrillation)     remote history, thought to be r/t acute cholecystits  . Frequent falls   . Dyslipidemia   . RBBB   . History of nuclear stress test 2000    persantine; low risk scan   . TIA (transient ischemic attack)   . Gait disturbance   . Lumbago   . Memory loss    Past Surgical History  Procedure Laterality Date  . Back surgery      lower  . Vericose veins    . Abdominal hysterectomy    . Breast surgery    . Wrist surgery    . Cardiac catheterization  02/07/2010    mild  CAD  . Fracture surgery  01/2011    left arm   . Mastectomy      Left breast with lymph node removeal  . Laparoscopic cholecystectomy  02/11/2010  . Transthoracic echocardiogram  04/20/2011    EF 55-60%; mildly thickened AV leaflets;    Family History  Problem Relation Age of Onset  . Diabetes type II Son    History  Substance Use Topics  . Smoking status: Never Smoker   . Smokeless tobacco: Never Used  . Alcohol Use: No   OB History   Grav Para Term Preterm Abortions TAB SAB Ect Mult Living                 Review of Systems  10 point review of symptoms obtained and is negative with the exceptions of symptoms noted abov.e    Allergies  Codeine  Home Medications   Prior to Admission medications   Medication Sig Start Date End Date Taking? Authorizing Provider  acetaminophen (TYLENOL) 325 MG tablet Take 650 mg by mouth every 4 (four) hours as needed. pain    Historical Provider, MD  aspirin 81 MG tablet Take 81 mg by mouth daily.    Historical  Provider, MD  calcium carbonate 200 MG capsule Take 250 mg by mouth daily.      Historical Provider, MD  Calcium Carbonate Antacid (MAALOX PO) Take by mouth as needed.    Historical Provider, MD  [facility] NIACIN FLUSH FREE 400-100 MG CAPS TAKE 2 CAPSULES AT BEDTIME    Pixie Casino, MD  diltiazem (CARDIZEM CD) 120 MG 24 hr capsule Take 120 mg by mouth daily.  01/29/11   Historical Provider, MD  diltiazem (CARDIZEM) 120 MG tablet Take 120 mg by mouth 4 (four) times daily.    Historical Provider, MD  donepezil (ARICEPT) 10 MG tablet Take 1 tablet (10 mg total) by mouth daily. 09/07/13   Marcial Pacas, MD  FLUoxetine (PROZAC) 10 MG capsule Take 10 mg by mouth daily.    Historical Provider, MD  KLOR-CON M20 20 MEQ tablet Take 20 mEq by mouth daily.  01/02/11   Historical Provider, MD  metFORMIN (GLUCOPHAGE-XR) 500 MG 24 hr tablet Take 500 mg by mouth daily with breakfast.    Historical Provider, MD  Multiple Vitamin (MULTIVITAMIN) capsule Take 1  capsule by mouth daily.      Historical Provider, MD  NAMENDA 10 MG tablet TAKE 1 TABLET BY MOUTH TWICE A DAY    Marcial Pacas, MD  niacin 500 MG tablet Take 1,000 mg by mouth at bedtime.     Historical Provider, MD  Omega-3 Fatty Acids (OMEGA-3 FISH OIL PO) Take 1 capsule by mouth daily.     Historical Provider, MD  potassium chloride (KLOR-CON) 20 MEQ packet Take 20 mEq by mouth 2 (two) times daily.    Historical Provider, MD  QUEtiapine (SEROQUEL) 25 MG tablet TAKE 1 TABLET BY MOUTH AT BEDTIME 01/20/13   Marcial Pacas, MD  VESICARE 5 MG tablet Take 5 mg by mouth 2 (two) times daily.  01/10/11   Historical Provider, MD   BP 166/57  Pulse 70  Temp(Src) 98.3 F (36.8 C) (Oral)  Resp 20  Ht 5\' 3"  (1.6 m)  Wt 175 lb (79.379 kg)  BMI 31.01 kg/m2  SpO2 96% Physical Exam Gen: well nourished and well developed appearing Head: Hematoma over the right for head with superficial abrasion. Maxillofacial regions nontender. Ears: normal to inspection Nose: normal to inspection, no epistaxis or drainage Mouth: oral mucsoa is well hydrated appearing, normal posterior oropharynx Neck: supple, no stridor, no C-spine tenderness CV: RRR, no murmur, palpable peripheral pulses times all 4 extremities Resp: lung sounds are clear to auscultation bilaterally, no wheeing or rhonchi or rales, normal respiratory effort.  Abd: soft, nontender, nondistended Extremities: There is some swelling with ecchymoses over the medial aspect of the right knee with tenderness at this region, I don't appreciate any effusion, I am able to range the knee passively. Otherwise all extremities are normal to inspection and nontender..  Skin: warm and dry Neuro: CN ii - XII, no focal deficitis Psyche; normal affect, cooperative.  ED Course  Procedures (including critical care time) Labs Review Labs Reviewed - No data to display  Imaging Review Ct Head Wo Contrast  11/28/2013   CLINICAL DATA:  Loss of balance and fall. Hit top of head.  Scalp hematoma and headache.  EXAM: CT HEAD WITHOUT CONTRAST  TECHNIQUE: Contiguous axial images were obtained from the base of the skull through the vertex without intravenous contrast.  COMPARISON:  CT of the head performed 04/18/2011, and MRI of the brain from 04/19/2011  FINDINGS: There is no evidence of acute infarction, mass lesion,  or intra- or extra-axial hemorrhage on CT.  Prominence of the ventricles and sulci reflects moderate cortical volume loss. Diffuse periventricular and subcortical white matter change likely reflects small vessel ischemic microangiopathy. Mild cerebellar atrophy is noted.  The brainstem and fourth ventricle are within normal limits. The basal ganglia are unremarkable in appearance. The cerebral hemispheres demonstrate grossly normal gray-white differentiation. No mass effect or midline shift is seen.  There is no evidence of fracture; visualized osseous structures are unremarkable in appearance. The orbits are within normal limits. Mild mucosal thickening is noted within the right side of the sphenoid sinus. The remaining paranasal sinuses and mastoid air cells are well-aerated. No significant soft tissue abnormalities are seen.  IMPRESSION: 1. No evidence of traumatic intracranial injury or fracture. 2. Moderate cortical volume loss and diffuse small vessel ischemic microangiopathy. 3. Mild mucosal thickening at the right side of the sphenoid sinus.   Electronically Signed   By: Garald Balding M.D.   On: 11/28/2013 23:08   Dg Knee Complete 4 Views Right  11/28/2013   CLINICAL DATA:  Fall.  EXAM: RIGHT KNEE - COMPLETE 4+ VIEW  COMPARISON:  12/22/2010.  FINDINGS: No acute bony abnormality. Specifically, no fracture, subluxation, or dislocation. Soft tissues are intact. No joint effusion.  IMPRESSION: No acute bony abnormality.   Electronically Signed   By: Rolm Baptise M.D.   On: 11/28/2013 22:59     MDM   DDX: ICH, skull fx, cephalohematoma, contusion of right knee v.  Internal injury v. Fracture.   Head CT NAICP and no fx on right knee xray. The patient's family has brought her walker and she is able to ambulate with her walker in the ED. She is stable for d/c. Td updated. F/U with PCP. Symptomatic management.     Elyn Peers, MD 11/29/13 [phone]

## 2013-11-29 NOTE — ED Notes (Signed)
Pt ambulated x 1 around pod with walker.  Gait steady.  No shortness of breath reported. Dr. Cheri Guppy made aware.

## 2013-11-29 NOTE — Discharge Instructions (Signed)

## 2013-12-14 ENCOUNTER — Encounter: Payer: Self-pay | Admitting: Internal Medicine

## 2013-12-14 ENCOUNTER — Ambulatory Visit (INDEPENDENT_AMBULATORY_CARE_PROVIDER_SITE_OTHER): Payer: Medicare Other | Admitting: Internal Medicine

## 2013-12-14 VITALS — BP 138/62 | HR 67 | Ht 62.0 in | Wt 176.0 lb

## 2013-12-14 DIAGNOSIS — I480 Paroxysmal atrial fibrillation: Secondary | ICD-10-CM

## 2013-12-14 DIAGNOSIS — W19XXXS Unspecified fall, sequela: Secondary | ICD-10-CM

## 2013-12-14 DIAGNOSIS — I10 Essential (primary) hypertension: Secondary | ICD-10-CM

## 2013-12-14 DIAGNOSIS — T7589XS Other specified effects of external causes, sequela: Secondary | ICD-10-CM

## 2013-12-14 DIAGNOSIS — I4891 Unspecified atrial fibrillation: Secondary | ICD-10-CM

## 2013-12-14 DIAGNOSIS — I4510 Unspecified right bundle-branch block: Secondary | ICD-10-CM

## 2013-12-14 NOTE — Patient Instructions (Signed)
Your physician wants you to follow-up in:  6 months. You will receive a reminder letter in the mail two months in advance. If you don't receive a letter, please call our office to schedule the follow-up appointment.   

## 2013-12-14 NOTE — Progress Notes (Signed)
Patient ID: Sara [name], female   DOB: 1925-12-05, 80 y.o.   MRN: [id number]    OFFICE NOTE  Chief Complaint:  Routine follow-up  Primary Care Physician: Florina Ou, MD  HPI:  Sara [name] is an 80 year old female who has had a history of falls due to imbalance and a history of dementia with some worsening memory loss. She had a history of paroxysmal atrial fibrillation which was very short lived and converted, possibly thought to be in the setting of acute cholecystitis. She has been on aspirin alone due to her falls and is not felt to be a good Coumadin candidate. In the past, she had been on Lasix up to 40 mg daily. This was, however, decreased due to excessive urination and eventually stopped.   Today she presents for annual follow-up. Has had issues with back and knees. Has constant pain in these areas. Has discussed with PCP and has been treated with everything per the patient. They have recently tried a topical cream. States they do not want to try anything stronger. Fell 2 weeks ago and went to the ED. Had CT head and XR right knee.    Endorses shortness of breath with exertion for years. No shortness of breath at rest. No issues with lungs. Notes this has been stable and has not worsened. No chest pain, orthopnea, PND, palpitations, syncope, pre-syncope. Recently saw PCP. Plan was for follow-up in one year.   PMHx:  Past Medical History  Diagnosis Date  . Cancer     breast - left  . Arthritis   . Diabetes mellitus     type 2  . Hypertension   . Generalized headaches   . Hearing loss   . Sore throat   . Confusion   . Weakness   . Mental disorder   . DEMENTIA   . Stroke 02-23-2011    recently diagnosed with mini-strokes  . PAF (paroxysmal atrial fibrillation)     remote history, thought to be r/t acute cholecystits  . Frequent falls   . Dyslipidemia   . RBBB   . History of nuclear stress test 2000    persantine; low risk scan   . TIA (transient ischemic attack)    . Gait disturbance   . Lumbago   . Memory loss     Past Surgical History  Procedure Laterality Date  . Back surgery      lower  . Vericose veins    . Abdominal hysterectomy    . Breast surgery    . Wrist surgery    . Cardiac catheterization  02/07/2010    mild CAD  . Fracture surgery  01/2011    left arm   . Mastectomy      Left breast with lymph node removeal  . Laparoscopic cholecystectomy  02/11/2010  . Transthoracic echocardiogram  04/20/2011    EF 55-60%; mildly thickened AV leaflets;     FAMHx:  Family History  Problem Relation Age of Onset  . Diabetes type II Son     SOCHx:   reports that she has never smoked. She has never used smokeless tobacco. She reports that she does not drink alcohol or use illicit drugs.  ALLERGIES:  Allergies  Allergen Reactions  . Codeine Nausea Only and Other (See Comments)    In addition, most pain medications cause confusion, make patient light headed.    ROS: A comprehensive review of systems was negative except for: Respiratory: positive for stable dyspnea on exertion  Musculoskeletal: positive for arthralgias and back pain  HOME MEDS: Current Outpatient Prescriptions  Medication Sig Dispense Refill  . acetaminophen (TYLENOL) 325 MG tablet Take 650 mg by mouth every 4 (four) hours as needed. pain      . aspirin 81 MG tablet Take 81 mg by mouth daily.      Marland Kitchen atorvastatin (LIPITOR) 10 MG tablet Take 10 mg by mouth daily.      Marland Kitchen b complex vitamins tablet Take 1 tablet by mouth daily.      . calcium carbonate (OS-CAL) 600 MG TABS tablet Take 600 mg by mouth daily with breakfast.      . Chlorpheniramine Maleate (ALLERGY PO) Take 10 mg by mouth daily.      . [facility] NIACIN FLUSH FREE 400-100 MG CAPS TAKE 2 CAPSULES AT BEDTIME  200 each  1  . diltiazem (CARDIZEM CD) 120 MG 24 hr capsule Take 120 mg by mouth daily.       Marland Kitchen donepezil (ARICEPT) 10 MG tablet Take 1 tablet (10 mg total) by mouth daily.  30 tablet  4  . DULoxetine  (CYMBALTA) 30 MG capsule Take 1 capsule by mouth daily.      Marland Kitchen FLUoxetine (PROZAC) 10 MG capsule Take 10 mg by mouth daily.      Marland Kitchen KLOR-CON M20 20 MEQ tablet Take 20 mEq by mouth daily.       . magnesium oxide (MAG-OX) 400 MG tablet Take 400 mg by mouth daily.      . meloxicam (MOBIC) 7.5 MG tablet Take 7.5 mg by mouth daily as needed for pain.      . Misc Natural Products (OSTEO BI-FLEX ADV DOUBLE ST PO) Take 1 capsule by mouth 2 (two) times daily.      Marland Kitchen NAMENDA 10 MG tablet TAKE 1 TABLET BY MOUTH TWICE A DAY  60 tablet  6  . Omega-3 Fatty Acids (OMEGA-3 FISH OIL PO) Take 1,000 mg by mouth daily.       Marland Kitchen oxybutynin (DITROPAN-XL) 10 MG 24 hr tablet Take 10 mg by mouth at bedtime.      Marland Kitchen QUEtiapine (SEROQUEL) 25 MG tablet TAKE 1 TABLET BY MOUTH AT BEDTIME  30 tablet  11   No current facility-administered medications for this visit.    LABS/IMAGING: No results found for this or any previous visit (from the past 48 hour(s)). No results found.  VITALS: BP 138/62  Pulse 67  Ht 5\' 2"  (1.575 m)  Wt 176 lb (79.833 kg)  BMI 32.18 kg/m2  SpO2 95%  EXAM: General appearance: alert, cooperative and no distress Neck: no adenopathy, no carotid bruit and no JVD Lungs: minimal bibasilar crackles Heart: regular rate and rhythm, S1, S2 normal, no murmur, click, rub or gallop Extremities: extremities normal, atraumatic, no cyanosis or edema Skin: no lesions or rashes noted Neurologic: Grossly normal  EKG: Rate 67, NSR, RBBB, ST depressions in V4-6, T-wave inversions III and aVF, no changes since last EKG  ASSESSMENT: 1. HTN 2. Hyperlipidemia 3. RBBB 4. Paroxysmal a-fib, not on warfarin due to fall risk 5. Dementia  PLAN: 1.   Patient has not had any recurrence of atrial fibrillation. She is to continue aspirin at this time due to fall concerns with other forms of anticoagulation. Note she has stable shortness of breath and oxygen saturation was after ambulation was 89%, though quickly  improved to 95%. Will continue to monitor this. She is to continue current medication regimen. We discussed that she needs  to continue to follow-up with her pain management physician for treatment of her chronic pain. Follow-up will be in one year or sooner if develops new symptoms.    Tommi Rumps 12/14/2013, 2:01 PM  Pt. Seen and examined. Agree with the resident note as written. Mrs. Heckert continues to do well without recurrence of atrial fibrillation. Unfortunately she is having falls and cannot be on any anticoagulation more than aspirin. She is moving slowly on account of chronic pain as well as back pain, shoulder and knee arthritis. Blood pressure is well controlled. Per her daughter the memory is getting worse. I recommend continuing current medications and will plan to see her back annually or sooner as necessary.  Pixie Casino, MD, [facility] Attending Cardiologist [place]

## 2014-01-12 ENCOUNTER — Ambulatory Visit: Payer: Self-pay | Admitting: Adult Health

## 2014-01-12 ENCOUNTER — Ambulatory Visit: Payer: 59 | Admitting: Nurse Practitioner

## 2014-01-13 ENCOUNTER — Encounter: Payer: Self-pay | Admitting: Adult Health

## 2014-01-13 ENCOUNTER — Ambulatory Visit (INDEPENDENT_AMBULATORY_CARE_PROVIDER_SITE_OTHER): Payer: Medicare Other | Admitting: Adult Health

## 2014-01-13 ENCOUNTER — Encounter (INDEPENDENT_AMBULATORY_CARE_PROVIDER_SITE_OTHER): Payer: Self-pay

## 2014-01-13 VITALS — BP 130/72 | HR 69 | Ht 61.0 in | Wt 174.0 lb

## 2014-01-13 DIAGNOSIS — R413 Other amnesia: Secondary | ICD-10-CM

## 2014-01-13 MED ORDER — MEMANTINE HCL ER 28 MG PO CP24: 28.0000 mg | ORAL_CAPSULE | Freq: Every day | ORAL | Status: DC

## 2014-01-13 NOTE — Patient Instructions (Signed)

## 2014-01-13 NOTE — Progress Notes (Signed)
PATIENT: Sara [name] DOB: 1926/03/19  REASON FOR VISIT: follow up HISTORY FROM: patient  HISTORY OF PRESENT ILLNESS: Sara [name] is a 80 year old female with a history of mild memory loss. She returns today for follow-up. She is currently taking Aricept 10 mg daily. She also takes namenda 10 mg BID but states that it is very expensive. She states that her memory has remained the same. She is able to complete all ADLS independently. She lives alone except at night her children stay with her. She cooks all her meals without difficulty. Although she sometimes turns on the wrong burner and will burn her burner covers. Her daughter does her finances.  She no longer operates a motor vehicle. She is sleeping well at night. She continues to use the Seroquel at night for agtiation. Daughter states that has been controlled.   HISTORY 01/12/13 (YY): Sara [name] is a 80 year old right-handed Caucasian female, accompanied by her daughter, referred by primary care physician for evaluation of short-term memory trouble  She has past medical history of hypertension, diabetes, CAT scan of the brain without contrast has demonstrated atrophy, modereate periventricular small vessel disease,  She had 12 years of education, worked at [employer], retired in 1989, has been active, widowed 12 years ago, still driving, daughter noticed she has gradual onset of memory trouble over past two years, also because of her chronic low back pain knee pain, has become sedentary, she fell a month ago, with left wrist fracture, status post surgical fixation, she had increased forgetfulness, occasionally confusion,  She still able to drive short distance, daughter helps her with bills now, she tends to make mistakes, she can still use her phone live independently,  there is no family history of dementia or, she has 3 brothers and 3 sisters, Her mother died of congestive heart failure at age 17, her father died of gastric ulcer  at age 4, besides this TIA episode, there was no history of stroke like symptoms, there was no sudden stepwise decline of her mentation.  She was taken to [facility] emergency room in April 18 2011, she was noticed to have sudden onset language difficulty, sounds like paraphasic errors, no loss of consciousness, no limb muscle weakness, lasting 5 minutes, she had extensive evaluation, MRI of the brain has demonstrate extensive white matter disease, MRA of the brain has demonstrate atherosclerotic disease, echocardiogram was normal, ejection fraction 55-60%, ultrasound of carotid artery no significant carotid artery stenosis,  She is now back to her baseline, daughter reported mild improvement with donepezil, she is less confused, she was also givenseroquel 25 mg every night, which helps her sleep, otherwise she has difficulty falling sleep, sometimes hear people calling her name, baby crying  UPDATE Sept 29th 2014:  She still lives alone, overall doing very well, she is taking donepezil 10 mg every day, Namenda 10 mg twice a day, daughter reported financial burden was Namenda years, she is sleeping well, continue to take seroquel 25 mg every day,   REVIEW OF SYSTEMS: Full 14 system review of systems performed and notable only for:  Constitutional: Activity change, appetite change, fatigue  Eyes: N/A Ear/Nose/Throat: Hearing loss, runny nose  Skin: N/A  Cardiovascular: N/A  Respiratory: N/A  Gastrointestinal: N/A  Genitourinary: Incontinence of bladder, urgency Hematology/Lymphatic: Bruise/bleed easily  Endocrine: Cold intolerance Musculoskeletal: Joint pain, back pain, walking difficulty Allergy/Immunology: N/A  Neurological: Memory loss Psychiatric:  confusion Sleep: Sleep talking  ALLERGIES: Allergies  Allergen Reactions  .  Codeine Nausea Only and Other (See Comments)    In addition, most pain medications cause confusion, make patient light headed.    HOME MEDICATIONS: Outpatient  Prescriptions Prior to Visit  Medication Sig Dispense Refill  . acetaminophen (TYLENOL) 325 MG tablet Take 650 mg by mouth every 4 (four) hours as needed. pain      . aspirin 81 MG tablet Take 81 mg by mouth daily.      Marland Kitchen b complex vitamins tablet Take 1 tablet by mouth daily.      . calcium carbonate (OS-CAL) 600 MG TABS tablet Take 600 mg by mouth daily with breakfast.      . Chlorpheniramine Maleate (ALLERGY PO) Take 10 mg by mouth daily.      . [facility] NIACIN FLUSH FREE 400-100 MG CAPS TAKE 2 CAPSULES AT BEDTIME  200 each  1  . diltiazem (CARDIZEM CD) 120 MG 24 hr capsule Take 120 mg by mouth daily.       Marland Kitchen donepezil (ARICEPT) 10 MG tablet Take 1 tablet (10 mg total) by mouth daily.  30 tablet  4  . DULoxetine (CYMBALTA) 30 MG capsule Take 1 capsule by mouth daily.      Marland Kitchen FLUoxetine (PROZAC) 10 MG capsule Take 10 mg by mouth daily.      Marland Kitchen KLOR-CON M20 20 MEQ tablet Take 20 mEq by mouth daily.       . magnesium oxide (MAG-OX) 400 MG tablet Take 400 mg by mouth daily.      . Misc Natural Products (OSTEO BI-FLEX ADV DOUBLE ST PO) Take 1 capsule by mouth 2 (two) times daily.      Marland Kitchen NAMENDA 10 MG tablet TAKE 1 TABLET BY MOUTH TWICE A DAY  60 tablet  6  . Omega-3 Fatty Acids (OMEGA-3 FISH OIL PO) Take 1,000 mg by mouth daily.       Marland Kitchen oxybutynin (DITROPAN-XL) 10 MG 24 hr tablet Take 10 mg by mouth at bedtime.      Marland Kitchen QUEtiapine (SEROQUEL) 25 MG tablet TAKE 1 TABLET BY MOUTH AT BEDTIME  30 tablet  11  . meloxicam (MOBIC) 7.5 MG tablet Take 7.5 mg by mouth daily as needed for pain.       No facility-administered medications prior to visit.    PAST MEDICAL HISTORY: Past Medical History  Diagnosis Date  . Cancer     breast - left  . Arthritis   . Diabetes mellitus     type 2  . Hypertension   . Generalized headaches   . Hearing loss   . Sore throat   . Confusion   . Weakness   . Mental disorder   . DEMENTIA   . Stroke 02-23-2011    recently diagnosed with mini-strokes  . PAF (paroxysmal  atrial fibrillation)     remote history, thought to be r/t acute cholecystits  . Frequent falls   . Dyslipidemia   . RBBB   . History of nuclear stress test 2000    persantine; low risk scan   . TIA (transient ischemic attack)   . Gait disturbance   . Lumbago   . Memory loss     PAST SURGICAL HISTORY: Past Surgical History  Procedure Laterality Date  . Back surgery      lower  . Vericose veins    . Abdominal hysterectomy    . Breast surgery    . Wrist surgery    . Cardiac catheterization  02/07/2010    mild  CAD  . Fracture surgery  01/2011    left arm   . Mastectomy      Left breast with lymph node removeal  . Laparoscopic cholecystectomy  02/11/2010  . Transthoracic echocardiogram  04/20/2011    EF 55-60%; mildly thickened AV leaflets;     FAMILY HISTORY: Family History  Problem Relation Age of Onset  . Diabetes type II Son     SOCIAL HISTORY: History   Social History  . Marital Status: Widowed    Spouse Name: N/A    Number of Children: 3  . Years of Education: 12   Occupational History  .      retired.   Social History Main Topics  . Smoking status: Never Smoker   . Smokeless tobacco: Never Used  . Alcohol Use: No  . Drug Use: No  . Sexual Activity: Not on file   Other Topics Concern  . Not on file   Social History Narrative   Patient is retired and lives at home alone. Patient has a high school education. Patient has three children.   Caffeine-      PHYSICAL EXAM  Filed Vitals:   01/13/14 1136  BP: 130/72  Pulse: 69  Height: 5\' 1"  (1.549 m)  Weight: 174 lb (78.926 kg)   Body mass index is 32.89 kg/(m^2).  Generalized: Well developed, in no acute distress   Neurological examination  Mentation: Alert oriented to time, place. Follows all commands speech and language fluent. MMSE 24/30 Cranial nerve II-XII: Pupils were equal round reactive to light. Extraocular movements were full, visual field were full on confrontational test. Facial  sensation and strength were normal.  Uvula tongue midline. Head turning and shoulder shrug  were normal and symmetric. Motor: The motor testing reveals 5 over 5 strength of all 4 extremities. Good symmetric motor tone is noted throughout.  Sensory: Sensory testing is intact to soft touch on all 4 extremities. No evidence of extinction is noted.  Coordination: Cerebellar testing reveals good finger-nose-finger and heel-to-shin bilaterally.  Gait and station: Uses a cane to ambulate. Tandem gait not attempted.  Romberg is negative. No drift is seen.  Reflexes: Deep tendon reflexes are symmetric and normal bilaterally.     DIAGNOSTIC DATA (LABS, IMAGING, TESTING) - I reviewed patient records, labs, notes, testing and imaging myself where available.  Lab Results  Component Value Date   WBC 6.4 04/19/2011   HGB 12.6 04/19/2011   HCT 38.0 04/19/2011   MCV 89.0 04/19/2011   PLT 169 04/19/2011      Component Value Date/Time   NA 143 04/21/2011 0500   K 3.7 04/22/2011 0842   CL 106 04/21/2011 0500   CO2 27 04/21/2011 0500   GLUCOSE 98 04/21/2011 0500   BUN 16 04/21/2011 0500   CREATININE 0.60 04/21/2011 0500   CALCIUM 8.8 04/21/2011 0500   PROT 6.1 04/19/2011 0600   ALBUMIN 3.0* 04/19/2011 0600   AST 14 04/19/2011 0600   ALT 10 04/19/2011 0600   ALKPHOS 68 04/19/2011 0600   BILITOT 0.4 04/19/2011 0600   GFRNONAA 82* 04/21/2011 0500   GFRAA >90 04/21/2011 0500   Lab Results  Component Value Date   CHOL 163 04/19/2011   HDL 29* 04/19/2011   LDLCALC 105* 04/19/2011   TRIG 147 04/19/2011   CHOLHDL 5.6 04/19/2011   Lab Results  Component Value Date   HGBA1C 5.9* 04/19/2011   No results found for this basename: [id number]   Lab Results  Component Value Date  TSH 0.645 04/19/2011      ASSESSMENT AND PLAN 80 y.o. year old female  has a past medical history of Cancer; Arthritis; Diabetes mellitus; Hypertension; Generalized headaches; Hearing loss; Sore throat; Confusion; Weakness; Mental disorder; DEMENTIA; Stroke (02-23-2011);  PAF (paroxysmal atrial fibrillation); Frequent falls; Dyslipidemia; RBBB; History of nuclear stress test (2000); TIA (transient ischemic attack); Gait disturbance; Lumbago; and Memory loss. here with:  1. Memory loss  Patient's memory score has decline slightly since the last visit. Her prior score was 27/30 and today it was 24/30. We will change her to the Namenda XR 28 mg. If the patient tolerates this medication then we may consider switching to namzaric. This medication may be cheaper for them. The patient will follow-up in 6 months or sooner if needed. When the patient and her daughter was getting ready to exit the exam room her daughters blood sugar dropped per the daughter. We gave her juice, once we were able to check a blood sugar it was 229 and she was feeling better. She stayed sitting in the room until she felt back to normal. I offered to call a family member to come get them but she stated that "she was fine now and this has happened before."  Ward Givens, MSN, NP-C 01/13/2014, 11:40 AM [facility] [address] [place], [place] [address] [phone]  Note: This document was prepared with digital dictation and possible smart phrase technology. Any transcriptional errors that result from this process are unintentional.

## 2014-02-02 ENCOUNTER — Other Ambulatory Visit: Payer: Self-pay | Admitting: Neurology

## 2014-02-07 ENCOUNTER — Other Ambulatory Visit: Payer: Self-pay | Admitting: Neurology

## 2014-02-07 MED ORDER — DONEPEZIL HCL 10 MG PO TABS
10.0000 mg | ORAL_TABLET | Freq: Every day | ORAL | Status: DC
Start: 2014-02-07 — End: 2014-08-14

## 2014-03-03 ENCOUNTER — Encounter: Payer: Self-pay | Admitting: Neurology

## 2014-03-09 ENCOUNTER — Encounter: Payer: Self-pay | Admitting: Neurology

## 2014-03-29 ENCOUNTER — Other Ambulatory Visit: Payer: Self-pay | Admitting: Neurology

## 2014-04-19 ENCOUNTER — Other Ambulatory Visit: Payer: Self-pay | Admitting: Neurology

## 2014-04-21 ENCOUNTER — Telehealth: Payer: Self-pay

## 2014-04-21 NOTE — Telephone Encounter (Signed)
Pharmacy sent a fax saying the patient would like to change from Namenda XR back to Namenda BID form.  This will be much less expensive since it is available in generic.  Okay to change?  I will be happy to update and send to pharmacy if permissible.  Thank you.

## 2014-04-21 NOTE — Telephone Encounter (Signed)
Rx has been updated and sent.

## 2014-04-21 NOTE — Telephone Encounter (Signed)
Ok to change. Thanks

## 2014-04-27 ENCOUNTER — Telehealth: Payer: Self-pay | Admitting: Internal Medicine

## 2014-04-27 NOTE — Telephone Encounter (Signed)
Close encounter 

## 2014-04-28 ENCOUNTER — Other Ambulatory Visit: Payer: Self-pay | Admitting: Neurology

## 2014-04-29 ENCOUNTER — Telehealth: Payer: Self-pay | Admitting: Internal Medicine

## 2014-04-29 NOTE — Telephone Encounter (Signed)
Close encounter 

## 2014-05-05 ENCOUNTER — Telehealth: Payer: Self-pay | Admitting: Internal Medicine

## 2014-05-06 NOTE — Telephone Encounter (Signed)
Close encounter 

## 2014-05-26 ENCOUNTER — Ambulatory Visit: Payer: Medicare Other | Admitting: Internal Medicine

## 2014-05-27 ENCOUNTER — Ambulatory Visit: Payer: Medicare Other | Admitting: Internal Medicine

## 2014-06-07 ENCOUNTER — Telehealth: Payer: Self-pay | Admitting: Adult Health

## 2014-06-07 NOTE — Telephone Encounter (Signed)
I called back.  Sara [name] says the patient is still taking all meds as prescribed, including Seroquel, but for the past couple of weeks around 5pm she is very irritable.  Says she packs her things and tells them she wants to go home and does not understand why they wont let her.  Denies any episodes of physical aggression.  They tried to give her Seroquel in the evening instead of at bedtime, but saw no change in behavior.  She would like to know if something else could be prescribed or recommended.  Please advise.  Thank you.

## 2014-06-07 NOTE — Telephone Encounter (Signed)
I called and spoke to the patient's daughter. She states that the patient has become more agitated around 4 or 5 PM. She states Saturday was the worse. The patient is stating that she wants to go home and tries packing a bag. However the patient is not verbally or physically abusive. The patient is also not trying to leave the home. After further conversation with the daughter the patient actually stays by herself for most of the day. She has family that comes in at 1 PM and spends the night with the patient. For now it seems that the patient may get agitated but she is not at risk of causing harm to herself or others. The patient should continue taking Seroquel at bedtime. For now we will monitor this conservatively. I have advised the daughter that if her agitation gets significantly worse we will consider additional medication.

## 2014-06-07 NOTE — Telephone Encounter (Signed)
Patint's daughter Izora Gala, stated patient becomes aggravated in the evenings around 5 pm.  Questioning if a [facility] could be called [facility]  Please call and advise.

## 2014-06-14 ENCOUNTER — Ambulatory Visit: Payer: Medicare Other | Admitting: Internal Medicine

## 2014-07-14 ENCOUNTER — Encounter: Payer: Self-pay | Admitting: Adult Health

## 2014-07-14 ENCOUNTER — Ambulatory Visit (INDEPENDENT_AMBULATORY_CARE_PROVIDER_SITE_OTHER): Payer: Medicare Other | Admitting: Adult Health

## 2014-07-14 VITALS — BP 149/72 | HR 57 | Ht 61.0 in | Wt 161.0 lb

## 2014-07-14 DIAGNOSIS — R413 Other amnesia: Secondary | ICD-10-CM

## 2014-07-14 NOTE — Progress Notes (Signed)
I agree with the assessment and plan as directed by NP .The patient is known to me .   Adric Wrede, MD  

## 2014-07-14 NOTE — Patient Instructions (Signed)
Continue Aricept and Namenda Try supplementing with Ensure between meals.  Increase water intake.

## 2014-07-14 NOTE — Progress Notes (Signed)
PATIENT: Sara [name] DOB: 21-Nov-1925  REASON FOR VISIT: follow up- Memory HISTORY FROM: patient  HISTORY OF PRESENT ILLNESS: Sara [name] is an 81 year old female with a history of mild memory loss. She returns today for follow-up. The patient continues to take Aricept 10 mg daily as well as Namenda 10 mg twice a day. She states that she is tolerating this medication very well. The patient states that her memory is "bad." She states at home she is able to complete all ADLs independently. She lives in her home however her children will stay with her at night. She states that she continues to cook her own meals but occasionally her family will provide meals for her. She does not operate a motor vehicle. Her daughter states that at times she'll have vivid dreams at night that will cause her to wander and become agitated. The patient continues to take Seroquel at bedtime. Daughter does report that she's had some weight loss. She states that her mom only eats 2 meals a day mainly because she does not get up in time for breakfast. She returns today for an evaluation.  HISTORY 01/13/14:Sara [name] is a 81 year old female with a history of mild memory loss. She returns today for follow-up. She is currently taking Aricept 10 mg daily. She also takes namenda 10 mg BID but states that it is very expensive. She states that her memory has remained the same. She is able to complete all ADLS independently. She lives alone except at night her children stay with her. She cooks all her meals without difficulty. Although she sometimes turns on the wrong burner and will burn her burner covers. Her daughter does her finances. She no longer operates a motor vehicle. She is sleeping well at night. She continues to use the Seroquel at night for agtiation. Daughter states that has been controlled.   HISTORY 01/12/13 (YY): Sara [name] is a 81 year old right-handed Caucasian female, accompanied by her daughter, referred by  primary care physician for evaluation of short-term memory trouble  She has past medical history of hypertension, diabetes, CAT scan of the brain without contrast has demonstrated atrophy, modereate periventricular small vessel disease,  She had 12 years of education, worked at [employer], retired in 1989, has been active, widowed 12 years ago, still driving, daughter noticed she has gradual onset of memory trouble over past two years, also because of her chronic low back pain knee pain, has become sedentary, she fell a month ago, with left wrist fracture, status post surgical fixation, she had increased forgetfulness, occasionally confusion,  She still able to drive short distance, daughter helps her with bills now, she tends to make mistakes, she can still use her phone live independently,  there is no family history of dementia or, she has 3 brothers and 3 sisters, Her mother died of congestive heart failure at age 54, her father died of gastric ulcer at age 50, besides this TIA episode, there was no history of stroke like symptoms, there was no sudden stepwise decline of her mentation.  She was taken to [facility] emergency room in April 18 2011, she was noticed to have sudden onset language difficulty, sounds like paraphasic errors, no loss of consciousness, no limb muscle weakness, lasting 5 minutes, she had extensive evaluation, MRI of the brain has demonstrate extensive white matter disease, MRA of the brain has demonstrate atherosclerotic disease, echocardiogram was normal, ejection fraction 55-60%, ultrasound of carotid artery no significant carotid  artery stenosis,  She is now back to her baseline, daughter reported mild improvement with donepezil, she is less confused, she was also givenseroquel 25 mg every night, which helps her sleep, otherwise she has difficulty falling sleep, sometimes hear people calling her name, baby crying  UPDATE Sept 29th 2014:  She still lives alone,  overall doing very well, she is taking donepezil 10 mg every day, Namenda 10 mg twice a day, daughter reported financial burden was Namenda years, she is sleeping well, continue to take seroquel 25 mg every day,   REVIEW OF SYSTEMS: Out of a complete 14 system review of symptoms, the patient complains only of the following symptoms, and all other reviewed systems are negative.  Fatigue, hearing loss, runny nose, cold intolerance, sleep talking, acting out dreams, frequent infections, incontinence of bladder, joint pain, back pain, aching muscles, walking difficulty, bruise/bleed easily, memory loss, headache, weakness, tremors, confusion, decreased concentration  ALLERGIES: Allergies  Allergen Reactions  . Codeine Nausea Only and Other (See Comments)    In addition, most pain medications cause confusion, make patient light headed.    HOME MEDICATIONS: Outpatient Prescriptions Prior to Visit  Medication Sig Dispense Refill  . acetaminophen (TYLENOL) 325 MG tablet Take 650 mg by mouth every 4 (four) hours as needed. pain    . aspirin 81 MG tablet Take 81 mg by mouth daily.    Marland Kitchen atorvastatin (LIPITOR) 10 MG tablet Take 10 mg by mouth daily at 6 PM.     . b complex vitamins tablet Take 1 tablet by mouth daily.    . calcium carbonate (OS-CAL) 600 MG TABS tablet Take 600 mg by mouth daily with breakfast.    . Chlorpheniramine Maleate (ALLERGY PO) Take 10 mg by mouth daily.    . [employer] NIACIN FLUSH FREE 400-100 MG CAPS TAKE 2 CAPSULES AT BEDTIME 200 each 1  . diltiazem (CARDIZEM CD) 120 MG 24 hr capsule Take 120 mg by mouth daily.     Marland Kitchen donepezil (ARICEPT) 10 MG tablet Take 1 tablet (10 mg total) by mouth daily. 30 tablet 5  . DULoxetine (CYMBALTA) 30 MG capsule Take 1 capsule by mouth daily.    Marland Kitchen FLUoxetine (PROZAC) 10 MG capsule Take 10 mg by mouth daily.    Marland Kitchen KLOR-CON M20 20 MEQ tablet Take 20 mEq by mouth daily.     . magnesium oxide (MAG-OX) 400 MG tablet Take 400 mg by mouth daily.    .  memantine (NAMENDA) 10 MG tablet TAKE 1 TABLET BY MOUTH TWICE A DAY 60 tablet 3  . Misc Natural Products (OSTEO BI-FLEX ADV DOUBLE ST PO) Take 1 capsule by mouth 2 (two) times daily.    . Naproxen Sodium (ALEVE PO) Take 220 mg by mouth 2 (two) times daily.     . Omega-3 Fatty Acids (OMEGA-3 FISH OIL PO) Take 1,000 mg by mouth daily.     Marland Kitchen oxybutynin (DITROPAN-XL) 10 MG 24 hr tablet Take 10 mg by mouth at bedtime.    Marland Kitchen QUEtiapine (SEROQUEL) 25 MG tablet TAKE 1 TABLET BY MOUTH AT BEDTIME 30 tablet 5  . diltiazem (CARDIZEM) 120 MG tablet     . memantine (NAMENDA) 10 MG tablet TAKE 1 TABLET BY MOUTH TWICE A DAY 60 tablet 6  . QUEtiapine (SEROQUEL) 25 MG tablet TAKE 1 TABLET BY MOUTH AT BEDTIME 30 tablet 11   No facility-administered medications prior to visit.    PAST MEDICAL HISTORY: Past Medical History  Diagnosis Date  .  Cancer     breast - left  . Arthritis   . Diabetes mellitus     type 2  . Hypertension   . Generalized headaches   . Hearing loss   . Sore throat   . Confusion   . Weakness   . Mental disorder   . DEMENTIA   . Stroke 02-23-2011    recently diagnosed with mini-strokes  . PAF (paroxysmal atrial fibrillation)     remote history, thought to be r/t acute cholecystits  . Frequent falls   . Dyslipidemia   . RBBB   . History of nuclear stress test 2000    persantine; low risk scan   . TIA (transient ischemic attack)   . Gait disturbance   . Lumbago   . Memory loss     PAST SURGICAL HISTORY: Past Surgical History  Procedure Laterality Date  . Back surgery      lower  . Vericose veins    . Abdominal hysterectomy    . Breast surgery    . Wrist surgery    . Cardiac catheterization  02/07/2010    mild CAD  . Fracture surgery  01/2011    left arm   . Mastectomy      Left breast with lymph node removeal  . Laparoscopic cholecystectomy  02/11/2010  . Transthoracic echocardiogram  04/20/2011    EF 55-60%; mildly thickened AV leaflets;     FAMILY  HISTORY: Family History  Problem Relation Age of Onset  . Diabetes type II Son     SOCIAL HISTORY: History   Social History  . Marital Status: Widowed    Spouse Name: N/A  . Number of Children: 3  . Years of Education: 12   Occupational History  .      retired.   Social History Main Topics  . Smoking status: Never Smoker   . Smokeless tobacco: Never Used  . Alcohol Use: No  . Drug Use: No  . Sexual Activity: Not on file   Other Topics Concern  . Not on file   Social History Narrative   Patient is retired and lives at home alone. Patient has a high school education. Patient has three children.   Caffeine-      PHYSICAL EXAM  Filed Vitals:   07/14/14 1101  BP: 149/72  Pulse: 57  Height: 5\' 1"  (1.549 m)  Weight: 161 lb (73.029 kg)   Body mass index is 30.44 kg/(m^2).  Generalized: Well developed, in no acute distress   Neurological examination  Mentation: Alert oriented to time, place, history taking. Follows all commands speech and language fluent. MMSE 26/30 Cranial nerve II-XII: Pupils were equal round reactive to light. Extraocular movements were full, visual field were full on confrontational test. Facial sensation and strength were normal. Uvula tongue midline. Head turning and shoulder shrug  were normal and symmetric. Motor: The motor testing reveals 5 over 5 strength of all 4 extremities. Good symmetric motor tone is noted throughout.  Sensory: Sensory testing is intact to soft touch on all 4 extremities. No evidence of extinction is noted.  Coordination: Cerebellar testing reveals good finger-nose-finger and heel-to-shin bilaterally.  Gait and station: Gait is unsteady, patient uses a cane ambulate. Tandem gait not attempted..  Reflexes: Deep tendon reflexes are symmetric and normal bilaterally.     DIAGNOSTIC DATA (LABS, IMAGING, TESTING) - I reviewed patient records, labs, notes, testing and imaging myself where available.   ASSESSMENT AND  PLAN 81 y.o. year old  female  has a past medical history of Cancer; Arthritis; Diabetes mellitus; Hypertension; Generalized headaches; Hearing loss; Sore throat; Confusion; Weakness; Mental disorder; DEMENTIA; Stroke (02-23-2011); PAF (paroxysmal atrial fibrillation); Frequent falls; Dyslipidemia; RBBB; History of nuclear stress test (2000); TIA (transient ischemic attack); Gait disturbance; Lumbago; and Memory loss. here with:  1. Mild memory loss  The patient's memory has remained stable. Today her MMSE is 26/30 was previously 23/30. The patient will continue taking Namenda 10 mg twice a day as well as Aricept 10 mg daily. I have instructed the family to try taking the Aricept at the beginning of the day rather than at bedtime to see if this improves her dreams. Family verbalized understanding. The patient has lost 10 pounds since her last visit which was in September. Overall this is not significant. I have instructed the patient to try drinking ensure supplements as well as increasing her water intake. If her symptoms worsen or she develops new symptoms she should let us know. Otherwise she will follow-up in 6 months or sooner if needed.     Ward Givens, MSN, NP-C 07/14/2014, 10:55 AM [facility] [address], [place], [place] [address] [phone]  Note: This document was prepared with digital dictation and possible smart phrase technology. Any transcriptional errors that result from this process are unintentional.

## 2014-07-18 ENCOUNTER — Other Ambulatory Visit: Payer: Self-pay | Admitting: Internal Medicine

## 2014-07-19 NOTE — Telephone Encounter (Signed)
Rx(s) sent to pharmacy electronically.  

## 2014-07-21 NOTE — Progress Notes (Signed)
I have reviewed and agreed above plan. 

## 2014-07-28 ENCOUNTER — Telehealth: Payer: Self-pay | Admitting: Internal Medicine

## 2014-07-29 NOTE — Telephone Encounter (Signed)
Close encounter 

## 2014-08-13 ENCOUNTER — Ambulatory Visit: Payer: Medicare Other | Admitting: Internal Medicine

## 2014-08-14 ENCOUNTER — Other Ambulatory Visit: Payer: Self-pay | Admitting: Neurology

## 2014-08-19 ENCOUNTER — Other Ambulatory Visit: Payer: Self-pay | Admitting: Neurology

## 2014-09-15 ENCOUNTER — Other Ambulatory Visit: Payer: Self-pay | Admitting: Neurology

## 2014-09-15 ENCOUNTER — Encounter: Payer: Self-pay | Admitting: Internal Medicine

## 2014-09-15 ENCOUNTER — Ambulatory Visit (INDEPENDENT_AMBULATORY_CARE_PROVIDER_SITE_OTHER): Payer: Medicare Other | Admitting: Internal Medicine

## 2014-09-15 VITALS — BP 126/66 | HR 90 | Ht 62.0 in | Wt 168.1 lb

## 2014-09-15 DIAGNOSIS — E119 Type 2 diabetes mellitus without complications: Secondary | ICD-10-CM | POA: Diagnosis not present

## 2014-09-15 DIAGNOSIS — E1169 Type 2 diabetes mellitus with other specified complication: Secondary | ICD-10-CM

## 2014-09-15 DIAGNOSIS — I4510 Unspecified right bundle-branch block: Secondary | ICD-10-CM | POA: Diagnosis not present

## 2014-09-15 DIAGNOSIS — E669 Obesity, unspecified: Secondary | ICD-10-CM

## 2014-09-15 DIAGNOSIS — I10 Essential (primary) hypertension: Secondary | ICD-10-CM | POA: Diagnosis not present

## 2014-09-15 DIAGNOSIS — I480 Paroxysmal atrial fibrillation: Secondary | ICD-10-CM | POA: Diagnosis not present

## 2014-09-15 DIAGNOSIS — F0390 Unspecified dementia without behavioral disturbance: Secondary | ICD-10-CM

## 2014-09-15 NOTE — Patient Instructions (Signed)
STOP niacin STOP fish oil   Your physician wants you to follow-up in: 6 months with Dr. Debara Pickett. You will receive a reminder letter in the mail two months in advance. If you don't receive a letter, please call our office to schedule the follow-up appointment.

## 2014-09-15 NOTE — Progress Notes (Signed)
Patient ID: Sara [name], female   DOB: 10-17-1925, 81 y.o.   MRN: [id number]    OFFICE NOTE  Chief Complaint:  Routine follow-up  Primary Care Physician: Sara Ou, MD  HPI:  Sara [name] is an 81 year old female who has had a history of falls due to imbalance and a history of dementia with some worsening memory loss. She had a history of paroxysmal atrial fibrillation which was very short lived and converted, possibly thought to be in the setting of acute cholecystitis. She has been on aspirin alone due to her falls and is not felt to be a good Coumadin candidate. In the past, she had been on Lasix up to 40 mg daily. This was, however, decreased due to excessive urination and eventually stopped.   Today she presents for annual follow-up. Has had issues with back and knees. Has constant pain in these areas. Has discussed with PCP and has been treated with everything per the patient. They have recently tried a topical cream. States they do not want to try anything stronger. Fell 2 weeks ago and went to the ED. Had CT head and XR right knee.    Sara [name] has no complaints today. Overall she is feeling fairly well. The memory continues to decline somewhat. She's had no recent falls. In the past and taken off her statin and I do not see clear indications for her to continue to be on fish oil and niacin.   PMHx:  Past Medical History  Diagnosis Date  . Cancer     breast - left  . Arthritis   . Diabetes mellitus     type 2  . Hypertension   . Generalized headaches   . Hearing loss   . Sore throat   . Confusion   . Weakness   . Mental disorder   . DEMENTIA   . Stroke 02-23-2011    recently diagnosed with mini-strokes  . PAF (paroxysmal atrial fibrillation)     remote history, thought to be r/t acute cholecystits  . Frequent falls   . Dyslipidemia   . RBBB   . History of nuclear stress test 2000    persantine; low risk scan   . TIA (transient ischemic attack)   . Gait  disturbance   . Lumbago   . Memory loss     Past Surgical History  Procedure Laterality Date  . Back surgery      lower  . Vericose veins    . Abdominal hysterectomy    . Breast surgery    . Wrist surgery    . Cardiac catheterization  02/07/2010    mild CAD  . Fracture surgery  01/2011    left arm   . Mastectomy      Left breast with lymph node removeal  . Laparoscopic cholecystectomy  02/11/2010  . Transthoracic echocardiogram  04/20/2011    EF 55-60%; mildly thickened AV leaflets;     FAMHx:  Family History  Problem Relation Age of Onset  . Diabetes type II Son     SOCHx:   reports that she has never smoked. She has never used smokeless tobacco. She reports that she does not drink alcohol or use illicit drugs.  ALLERGIES:  Allergies  Allergen Reactions  . Codeine Nausea Only and Other (See Comments)    In addition, most pain medications cause confusion, make patient light headed.    ROS: A comprehensive review of systems was negative except for: Respiratory: positive for stable  dyspnea on exertion Musculoskeletal: positive for arthralgias and back pain  HOME MEDS: Current Outpatient Prescriptions  Medication Sig Dispense Refill  . acetaminophen (TYLENOL) 325 MG tablet Take 650 mg by mouth every 4 (four) hours as needed. pain    . aspirin 81 MG tablet Take 81 mg by mouth daily.    Marland Kitchen b complex vitamins tablet Take 1 tablet by mouth daily.    . calcium carbonate (OS-CAL) 600 MG TABS tablet Take 600 mg by mouth daily with breakfast.    . Chlorpheniramine Maleate (ALLERGY PO) Take 10 mg by mouth daily.    Marland Kitchen diltiazem (CARDIZEM CD) 120 MG 24 hr capsule Take 120 mg by mouth daily.     Marland Kitchen donepezil (ARICEPT) 10 MG tablet TAKE 1 TABLET (10 MG TOTAL) BY MOUTH DAILY. 30 tablet 5  . DULoxetine (CYMBALTA) 30 MG capsule Take 1 capsule by mouth daily.    Marland Kitchen FLUoxetine (PROZAC) 10 MG capsule Take 10 mg by mouth daily.    Marland Kitchen KLOR-CON M20 20 MEQ tablet Take 20 mEq by mouth daily.      . magnesium oxide (MAG-OX) 400 MG tablet Take 400 mg by mouth daily.    . memantine (NAMENDA) 10 MG tablet TAKE 1 TABLET BY MOUTH TWICE A DAY 60 tablet 3  . Misc Natural Products (OSTEO BI-FLEX ADV DOUBLE ST PO) Take 1 capsule by mouth 2 (two) times daily.    . Naproxen Sodium (ALEVE PO) Take 220 mg by mouth 2 (two) times daily.     . QUEtiapine (SEROQUEL) 25 MG tablet TAKE 1 TABLET BY MOUTH AT BEDTIME 30 tablet 5  . donepezil (ARICEPT) 10 MG tablet TAKE 1 TABLET (10 MG TOTAL) BY MOUTH DAILY. 30 tablet 5   No current facility-administered medications for this visit.    LABS/IMAGING: No results found for this or any previous visit (from the past 48 hour(s)). No results found.  VITALS: BP 126/66 mmHg  Pulse 90  Ht 5\' 2"  (1.575 m)  Wt 168 lb 1.6 oz (76.25 kg)  BMI 30.74 kg/m2  EXAM: General appearance: alert, cooperative and no distress Neck: no adenopathy, no carotid bruit and no JVD Lungs: minimal bibasilar crackles Heart: regular rate and rhythm, S1, S2 normal, no murmur, click, rub or gallop Extremities: extremities normal, atraumatic, no cyanosis or edema Skin: no lesions or rashes noted Neurologic: Grossly normal  EKG: Rate 90,  NSR, RBBB, ST depressions in V4-6, T-wave inversions III and aVF, PACs  ASSESSMENT: 1. HTN 2. Hyperlipidemia- not actively treating due to weight loss 3. RBBB 4. Paroxysmal a-fib, not on warfarin due to fall risk 5. Dementia  PLAN: 1.   Sara [name] continues to have weight loss and has had a lower cholesterol profile. I do not see clear indication for statin at this point and it was discontinued in the past. There is little indication therefore for niacin or fish oil. Hopefully we can take this off her list and reduce her medicines to some extent. There continues to be some weight loss and memory loss. Fortunately she's had no recent falls. She will need to remain on aspirin for stroke prevention with A. fib although that appears to be very  infrequent. Warfarin or other anticoagulant are too high risk given her situation. Blood pressure is well-controlled today. No need to adjust her blood pressure medicines. Plan to see her back in 6 months or sooner as necessary.  Pixie Casino, MD, [facility] Attending Cardiologist [place]  09/15/2014, 3:28 PM

## 2014-11-17 ENCOUNTER — Encounter: Payer: Self-pay | Admitting: Adult Health

## 2015-01-14 ENCOUNTER — Ambulatory Visit: Payer: Medicare Other | Admitting: Adult Health

## 2015-01-19 ENCOUNTER — Encounter: Payer: Self-pay | Admitting: Adult Health

## 2015-01-19 ENCOUNTER — Ambulatory Visit (INDEPENDENT_AMBULATORY_CARE_PROVIDER_SITE_OTHER): Payer: Medicare Other | Admitting: Adult Health

## 2015-01-19 VITALS — BP 118/62 | HR 73 | Ht 62.0 in | Wt 164.0 lb

## 2015-01-19 DIAGNOSIS — R413 Other amnesia: Secondary | ICD-10-CM

## 2015-01-19 MED ORDER — DONEPEZIL HCL 10 MG PO TABS: ORAL_TABLET | ORAL | Status: DC

## 2015-01-19 MED ORDER — MEMANTINE HCL 10 MG PO TABS: 10.0000 mg | ORAL_TABLET | Freq: Two times a day (BID) | ORAL | Status: DC

## 2015-01-19 NOTE — Progress Notes (Signed)
I have reviewed and agreed above plan. 

## 2015-01-19 NOTE — Patient Instructions (Signed)
Continue Aricept and Namenda We will continue to monitor memory If your symptoms worsen or you develop new symptoms please let us know.

## 2015-01-19 NOTE — Progress Notes (Signed)
PATIENT: Sara [name] DOB: 06/06/1925  REASON FOR VISIT: follow up- memory HISTORY FROM: patient  HISTORY OF PRESENT ILLNESS: Ms Sara [name] is an 81 year old female with a history of mild memory loss. She returns today for follow-up. Patient continues to take Aricept and Namenda. She reports that she is tolerating this medication well. The patient reports that her memory has continued to decline. She is able to complete all ADLs independent. She lives at home alone however she has family that stays with her at night. She is able to prepare her own meals. Patient and family deny agitation or aggressiveness. She does take Seroquel at bedtime. She states that she has a headache today as well as back pain. She states that she almost canceled her appointment because she is not feeling well. She returns today for an evaluation.  HISTORY  07/14/14: Sara [name] is an 81 year old female with a history of mild memory loss. She returns today for follow-up. The patient continues to take Aricept 10 mg daily as well as Namenda 10 mg twice a day. She states that she is tolerating this medication very well. The patient states that her memory is "bad." She states at home she is able to complete all ADLs independently. She lives in her home however her children will stay with her at night. She states that she continues to cook her own meals but occasionally her family will provide meals for her. She does not operate a motor vehicle. Her daughter states that at times she'll have vivid dreams at night that will cause her to wander and become agitated. The patient continues to take Seroquel at bedtime. Daughter does report that she's had some weight loss. She states that her mom only eats 2 meals a day mainly because she does not get up in time for breakfast. She returns today for an evaluation.  HISTORY 01/13/14:[name] is a 81 year old female with a history of mild memory loss. She returns today for follow-up. She  is currently taking Aricept 10 mg daily. She also takes namenda 10 mg BID but states that it is very expensive. She states that her memory has remained the same. She is able to complete all ADLS independently. She lives alone except at night her children stay with her. She cooks all her meals without difficulty. Although she sometimes turns on the wrong burner and will burn her burner covers. Her daughter does her finances. She no longer operates a motor vehicle. She is sleeping well at night. She continues to use the Seroquel at night for agtiation. Daughter states that has been controlled.   HISTORY 01/12/13 (YY): Sara [name] is a 81 year old right-handed Caucasian female, accompanied by her daughter, referred by primary care physician for evaluation of short-term memory trouble  She has past medical history of hypertension, diabetes, CAT scan of the brain without contrast has demonstrated atrophy, modereate periventricular small vessel disease,  She had 12 years of education, worked at [employer], retired in 1989, has been active, widowed 12 years ago, still driving, daughter noticed she has gradual onset of memory trouble over past two years, also because of her chronic low back pain knee pain, has become sedentary, she fell a month ago, with left wrist fracture, status post surgical fixation, she had increased forgetfulness, occasionally confusion,  She still able to drive short distance, daughter helps her with bills now, she tends to make mistakes, she can still use her phone live independently,  there is  no family history of dementia or, she has 3 brothers and 3 sisters, Her mother died of congestive heart failure at age 82, her father died of gastric ulcer at age 36, besides this TIA episode, there was no history of stroke like symptoms, there was no sudden stepwise decline of her mentation.  She was [facility] to [facility] emergency room in April 18 2011, she was noticed to have sudden onset  language difficulty, sounds like paraphasic errors, no loss of consciousness, no limb muscle weakness, lasting 5 minutes, she had extensive evaluation, MRI of the brain has demonstrate extensive white matter disease, MRA of the brain has demonstrate atherosclerotic disease, echocardiogram was normal, ejection fraction 55-60%, ultrasound of carotid artery no significant carotid artery stenosis,  She is now back to her baseline, daughter reported mild improvement with donepezil, she is less confused, she was also givenseroquel 25 mg every night, which helps her sleep, otherwise she has difficulty falling sleep, sometimes hear people calling her name, baby crying  UPDATE Sept 29th 2014:  She still lives alone, overall doing very well, she is taking donepezil 10 mg every day, Namenda 10 mg twice a day, daughter reported financial burden was Namenda years, she is sleeping well, continue to take seroquel 25 mg every day,  REVIEW OF SYSTEMS: Out of a complete 14 system review of symptoms, the patient complains only of the following symptoms, and all other reviewed systems are negative.  Incontinence of bladder, joint pain, back pain, walking difficulty, daytime sleepiness, sleep talking, confusion, decreased concentration, nervous/anxious, speech difficulty, weakness, headache, memory loss, bruise/bleed easily, appetite change  ALLERGIES: Allergies  Allergen Reactions  . Codeine Nausea Only and Other (See Comments)    In addition, most pain medications cause confusion, make patient light headed.    HOME MEDICATIONS: Outpatient Prescriptions Prior to Visit  Medication Sig Dispense Refill  . acetaminophen (TYLENOL) 325 MG tablet Take 650 mg by mouth every 4 (four) hours as needed. pain    . aspirin 81 MG tablet Take 81 mg by mouth daily.    Marland Kitchen b complex vitamins tablet Take 1 tablet by mouth daily.    . calcium carbonate (OS-CAL) 600 MG TABS tablet Take 600 mg by mouth daily with breakfast.    .  Chlorpheniramine Maleate (ALLERGY PO) Take 10 mg by mouth daily.    Marland Kitchen diltiazem (CARDIZEM CD) 120 MG 24 hr capsule Take 120 mg by mouth daily.     Marland Kitchen donepezil (ARICEPT) 10 MG tablet TAKE 1 TABLET (10 MG TOTAL) BY MOUTH DAILY. 30 tablet 5  . donepezil (ARICEPT) 10 MG tablet TAKE 1 TABLET (10 MG TOTAL) BY MOUTH DAILY. 30 tablet 5  . DULoxetine (CYMBALTA) 30 MG capsule Take 1 capsule by mouth daily.    Marland Kitchen FLUoxetine (PROZAC) 10 MG capsule Take 10 mg by mouth daily.    Marland Kitchen KLOR-CON M20 20 MEQ tablet Take 20 mEq by mouth daily.     . magnesium oxide (MAG-OX) 400 MG tablet Take 400 mg by mouth daily.    . memantine (NAMENDA) 10 MG tablet TAKE 1 TABLET BY MOUTH TWICE A DAY 60 tablet 3  . Misc Natural Products (OSTEO BI-FLEX ADV DOUBLE ST PO) Take 1 capsule by mouth 2 (two) times daily.    . Naproxen Sodium (ALEVE PO) Take 220 mg by mouth 2 (two) times daily.     . QUEtiapine (SEROQUEL) 25 MG tablet TAKE 1 TABLET BY MOUTH AT BEDTIME 30 tablet 5   No facility-administered  medications prior to visit.    PAST MEDICAL HISTORY: Past Medical History  Diagnosis Date  . Cancer     breast - left  . Arthritis   . Diabetes mellitus     type 2  . Hypertension   . Generalized headaches   . Hearing loss   . Sore throat   . Confusion   . Weakness   . Mental disorder   . DEMENTIA   . Stroke 02-23-2011    recently diagnosed with mini-strokes  . PAF (paroxysmal atrial fibrillation)     remote history, thought to be r/t acute cholecystits  . Frequent falls   . Dyslipidemia   . RBBB   . History of nuclear stress test 2000    persantine; low risk scan   . TIA (transient ischemic attack)   . Gait disturbance   . Lumbago   . Memory loss     PAST SURGICAL HISTORY: Past Surgical History  Procedure Laterality Date  . Back surgery      lower  . Vericose veins    . Abdominal hysterectomy    . Breast surgery    . Wrist surgery    . Cardiac catheterization  02/07/2010    mild CAD  . Fracture surgery   01/2011    left arm   . Mastectomy      Left breast with lymph node removeal  . Laparoscopic cholecystectomy  02/11/2010  . Transthoracic echocardiogram  04/20/2011    EF 55-60%; mildly thickened AV leaflets;     FAMILY HISTORY: Family History  Problem Relation Age of Onset  . Diabetes type II Son     SOCIAL HISTORY: Social History   Social History  . Marital Status: Widowed    Spouse Name: N/A  . Number of Children: 3  . Years of Education: 12   Occupational History  .      retired.   Social History Main Topics  . Smoking status: Never Smoker   . Smokeless tobacco: Never Used  . Alcohol Use: No  . Drug Use: No  . Sexual Activity: Not on file   Other Topics Concern  . Not on file   Social History Narrative   Patient is retired and lives at home alone. Patient has a high school education. Patient has three children.   Caffeine-      PHYSICAL EXAM  Filed Vitals:   01/19/15 1120  BP: 118/62  Pulse: 73  Height: 5\' 2"  (1.575 m)  Weight: 164 lb (74.39 kg)   Body mass index is 29.99 kg/(m^2).  MMSE - Mini Mental State Exam 01/19/2015 07/14/2014  Orientation to time 1 4  Orientation to Place 3 4  Registration 3 3  Attention/ Calculation 5 5  Recall 1 2  Language- name 2 objects 2 2  Language- repeat 1 1  Language- follow 3 step command 2 3  Language- read & follow direction 1 1  Write a sentence 1 1  Copy design 0 0  Total score 20 26   Generalized: Well developed, in no acute distress   Neurological examination  Mentation: Alert. Follows all commands speech and language fluent Cranial nerve II-XII: Pupils were equal round reactive to light. Extraocular movements were full, visual field were full on confrontational test. Facial sensation and strength were normal. Uvula tongue midline. Head turning and shoulder shrug  were normal and symmetric. Motor: The motor testing reveals 5 over 5 strength of all 4 extremities. Good symmetric motor tone  is noted  throughout.  Sensory: Sensory testing is intact to soft touch on all 4 extremities. No evidence of extinction is noted.  Coordination: Cerebellar testing reveals good finger-nose-finger and heel-to-shin bilaterally.  Gait and station: uses a cane when ambulating.  Reflexes: Deep tendon reflexes are symmetric and normal bilaterally.   DIAGNOSTIC DATA (LABS, IMAGING, TESTING) - I reviewed patient records, labs, notes, testing and imaging myself where available.   ASSESSMENT AND PLAN 81 y.o. year old female  has a past medical history of Cancer; Arthritis; Diabetes mellitus; Hypertension; Generalized headaches; Hearing loss; Sore throat; Confusion; Weakness; Mental disorder; DEMENTIA; Stroke (02-23-2011); PAF (paroxysmal atrial fibrillation); Frequent falls; Dyslipidemia; RBBB; History of nuclear stress test (2000); TIA (transient ischemic attack); Gait disturbance; Lumbago; and Memory loss. here with:  1. Memory loss  The patient's memory score has slightly declined. MMSE today is 20/30 was previously 26/30. The patient and her daughter feel that her decline today may be contributed to her not feeling well. She will continue on Aricept and Namenda. We will continue to monitor the memory over time. Patient and daughter are advised that if her symptoms worsen or she develops any new symptoms she should let us know. She will follow-up in 6 months with Dr. Krista Blue.  Ward Givens, MSN, NP-C 01/19/2015, 11:19 AM [facility] [address], [place], [place] [address] [phone]

## 2015-03-15 ENCOUNTER — Ambulatory Visit: Payer: Medicare Other | Admitting: Internal Medicine

## 2015-05-17 ENCOUNTER — Ambulatory Visit (INDEPENDENT_AMBULATORY_CARE_PROVIDER_SITE_OTHER): Payer: Medicare Other | Admitting: Internal Medicine

## 2015-05-17 ENCOUNTER — Encounter: Payer: Self-pay | Admitting: Internal Medicine

## 2015-05-17 ENCOUNTER — Ambulatory Visit (INDEPENDENT_AMBULATORY_CARE_PROVIDER_SITE_OTHER): Payer: Medicare Other

## 2015-05-17 VITALS — BP 112/68 | HR 80 | Ht 62.0 in | Wt 156.3 lb

## 2015-05-17 DIAGNOSIS — I4510 Unspecified right bundle-branch block: Secondary | ICD-10-CM

## 2015-05-17 DIAGNOSIS — I480 Paroxysmal atrial fibrillation: Secondary | ICD-10-CM

## 2015-05-17 DIAGNOSIS — R0602 Shortness of breath: Secondary | ICD-10-CM

## 2015-05-17 DIAGNOSIS — R0609 Other forms of dyspnea: Secondary | ICD-10-CM

## 2015-05-17 DIAGNOSIS — I517 Cardiomegaly: Secondary | ICD-10-CM

## 2015-05-17 DIAGNOSIS — F0390 Unspecified dementia without behavioral disturbance: Secondary | ICD-10-CM

## 2015-05-17 LAB — BASIC METABOLIC PANEL
BUN: 14 mg/dL (ref 7–25)
CALCIUM: 9.5 mg/dL (ref 8.6–10.4)
CO2: 31 mmol/L (ref 20–31)
Chloride: 104 mmol/L (ref 98–110)
Creat: 0.71 mg/dL (ref 0.60–0.88)
GLUCOSE: 108 mg/dL — AB (ref 65–99)
Potassium: 4.3 mmol/L (ref 3.5–5.3)
Sodium: 141 mmol/L (ref 135–146)

## 2015-05-17 NOTE — Progress Notes (Signed)
Patient ID: Sara [name], female   DOB: January 15, 1926, 82 y.o.   MRN: [id number]    OFFICE NOTE  Chief Complaint:  Recent fall, shortness of breath and cardiomegaly  Primary Care Physician: Aura Dials, PA-C  HPI:  Sara [name] is an 82 year old female who has had a history of falls due to imbalance and a history of dementia with some worsening memory loss. She had a history of paroxysmal atrial fibrillation which was very short lived and converted, possibly thought to be in the setting of acute cholecystitis. She has been on aspirin alone due to her falls and is not felt to be a good Coumadin candidate. In the past, she had been on Lasix up to 40 mg daily. This was, however, decreased due to excessive urination and eventually stopped.   Today she presents for annual follow-up. Has had issues with back and knees. Has constant pain in these areas. Has discussed with PCP and has been treated with everything per the patient. They have recently tried a topical cream. States they do not want to try anything stronger. Fell 2 weeks ago and went to the ED. Had CT head and XR right knee.    Sara [name] has no complaints today. Overall she is feeling fairly well. The memory continues to decline somewhat. She's had no recent falls. In the past and taken off her statin and I do not see clear indications for her to continue to be on fish oil and niacin.   Sara [name] returns today accompanied by her daughter. Recently she's had a fall and was seen in the [facility]. She underwent a CT scan of her chest and head. The chest CT did not indicate any fractures although she was complaining of left chest pain. She was noted to have some aortic and coronary atherosclerosis as well as mild cardiomegaly. There were diffuse groundglass changes on the CT scan concerning for interstitial findings. Sara [name] reports some worsening shortness of breath recently. Her daughter notes that some of her pain complaints of been out  of proportion and may be related to her dementia. She is also on Seroquel, possibly for behavioral disturbance or sleep problems.  PMHx:  Past Medical History  Diagnosis Date  . Cancer [facility])     breast - left  . Arthritis   . Diabetes mellitus     type 2  . Hypertension   . Generalized headaches   . Hearing loss   . Sore throat   . Confusion   . Weakness   . Mental disorder   . DEMENTIA   . Stroke [facility]) 02-23-2011    recently diagnosed with mini-strokes  . PAF (paroxysmal atrial fibrillation) ([place])     remote history, thought to be r/t acute cholecystits  . Frequent falls   . Dyslipidemia   . RBBB   . History of nuclear stress test 2000    persantine; low risk scan   . TIA (transient ischemic attack)   . Gait disturbance   . Lumbago   . Memory loss     Past Surgical History  Procedure Laterality Date  . Back surgery      lower  . Vericose veins    . Abdominal hysterectomy    . Breast surgery    . Wrist surgery    . Cardiac catheterization  02/07/2010    mild CAD  . Fracture surgery  01/2011    left arm   . Mastectomy  Left breast with lymph node removeal  . Laparoscopic cholecystectomy  02/11/2010  . Transthoracic echocardiogram  04/20/2011    EF 55-60%; mildly thickened AV leaflets;     FAMHx:  Family History  Problem Relation Age of Onset  . Diabetes type II Son     SOCHx:   reports that she has never smoked. She has never used smokeless tobacco. She reports that she does not drink alcohol or use illicit drugs.  ALLERGIES:  Allergies  Allergen Reactions  . Codeine Nausea Only and Other (See Comments)    In addition, most pain medications cause confusion, make patient light headed.    ROS: A comprehensive review of systems was negative except for: Respiratory: positive for stable dyspnea on exertion Musculoskeletal: positive for arthralgias and back pain  HOME MEDS: Current Outpatient Prescriptions  Medication Sig Dispense Refill  .  acetaminophen (TYLENOL) 325 MG tablet Take 650 mg by mouth every 4 (four) hours as needed. pain    . aspirin 81 MG tablet Take 81 mg by mouth daily.    Marland Kitchen b complex vitamins tablet Take 1 tablet by mouth daily.    . calcium carbonate (OS-CAL) 600 MG TABS tablet Take 600 mg by mouth daily with breakfast.    . Chlorpheniramine Maleate (ALLERGY PO) Take 10 mg by mouth daily.    Marland Kitchen diltiazem (CARDIZEM CD) 120 MG 24 hr capsule Take 120 mg by mouth daily.     Marland Kitchen donepezil (ARICEPT) 10 MG tablet TAKE 1 TABLET (10 MG TOTAL) BY MOUTH DAILY. 30 tablet 5  . DULoxetine (CYMBALTA) 30 MG capsule Take 1 capsule by mouth daily.    Marland Kitchen FLUoxetine (PROZAC) 10 MG capsule Take 10 mg by mouth daily.    Marland Kitchen KLOR-CON M20 20 MEQ tablet Take 20 mEq by mouth daily.     . magnesium oxide (MAG-OX) 400 MG tablet Take 400 mg by mouth daily.    . memantine (NAMENDA) 10 MG tablet Take 1 tablet (10 mg total) by mouth 2 (two) times daily. 60 tablet 3  . Misc Natural Products (OSTEO BI-FLEX ADV DOUBLE ST PO) Take 1 capsule by mouth 2 (two) times daily.    . Naproxen Sodium (ALEVE PO) Take 220 mg by mouth 2 (two) times daily.     . nitrofurantoin (MACRODANTIN) 100 MG capsule Take 100 mg by mouth daily.  11  . QUEtiapine (SEROQUEL) 25 MG tablet TAKE 1 TABLET BY MOUTH AT BEDTIME 30 tablet 5   No current facility-administered medications for this visit.    LABS/IMAGING: No results found for this or any previous visit (from the past 48 hour(s)). No results found.  VITALS: BP 112/68 mmHg  Pulse 80  Ht 5\' 2"  (1.575 m)  Wt 156 lb 4.8 oz (70.897 kg)  BMI 28.58 kg/m2  EXAM: General appearance: alert, cooperative and no distress Neck: no adenopathy, no carotid bruit and no JVD Lungs: Significant dry crackles in both bases but worse in the right base Heart: regular rate and rhythm, S1, S2 normal, no murmur, click, rub or gallop Extremities: extremities normal, atraumatic, no cyanosis or edema Skin: no lesions or rashes  noted Neurologic: Grossly normal  EKG: Normal sinus rhythm 80,  bifascicular block  ASSESSMENT: 1. HTN 2. Hyperlipidemia- not actively treating due to weight loss 3. Bifascicular block 4. Paroxysmal a-fib, not on warfarin due to fall risk 5. Dementia 6. Cardiomegaly and shortness of breath-groundglass opacities on CT  PLAN: 1.   Mrs. Faron has had some progressive shortness  of breath and signs of cardiomegaly on x-ray and CT. Last echo was in 2013 and showed normal LV size and function. I like to get a repeat echo to make sure she has not developed a new cardiomyopathy. In addition I'll obtain blood work including a metabolic profile and BNP. The CT findings are concerning for an interstitial process. While these could be findings associated with heart failure, her exam is not significant for that. The rales sound more dry to me suggestive of a interstitial lung disease. Hopefully we can determine if there is cardiac or whether this is a primary pulmonary process. She does have a follow-up with Dr. Melvyn Novas in pulmonary next month.  Pixie Casino, MD, [facility] Attending Cardiologist Marquette C Hilty 05/17/2015, 1:23 PM

## 2015-05-17 NOTE — Patient Instructions (Signed)
Your physician has requested that you have an echocardiogram. Echocardiography is a painless test that uses sound waves to create images of your heart. It provides your doctor with information about the size and shape of your heart and how well your heart's chambers and valves are working. This procedure takes approximately one hour. There are no restrictions for this procedure.  Your physician recommends that you return for lab work TODAY.  Your physician has recommended that you wear a holter monitor. Holter monitors are medical devices that record the heart's electrical activity. Doctors most often use these monitors to diagnose arrhythmias. Arrhythmias are problems with the speed or rhythm of the heartbeat. The monitor is a small, portable device. You can wear one while you do your normal daily activities. This is usually used to diagnose what is causing palpitations/syncope (passing out).  Dr Debara Pickett recommends that you schedule a follow-up appointment after the tests have been completed.  If you need a refill on your cardiac medications before your next appointment, please call your pharmacy.

## 2015-05-18 ENCOUNTER — Ambulatory Visit: Payer: Medicare Other | Admitting: Endocrinology

## 2015-05-18 LAB — BRAIN NATRIURETIC PEPTIDE: BRAIN NATRIURETIC PEPTIDE: 104.6 pg/mL — AB (ref 0.0–100.0)

## 2015-05-23 ENCOUNTER — Ambulatory Visit (HOSPITAL_COMMUNITY)
Admission: RE | Admit: 2015-05-23 | Discharge: 2015-05-23 | Disposition: A | Discharge status: Home / Self Care | Payer: Medicare Other | Source: Ambulatory Visit | Attending: Cardiovascular Disease | Admitting: Cardiovascular Disease

## 2015-05-23 ENCOUNTER — Other Ambulatory Visit (INDEPENDENT_AMBULATORY_CARE_PROVIDER_SITE_OTHER): Payer: Medicare Other

## 2015-05-23 ENCOUNTER — Ambulatory Visit (INDEPENDENT_AMBULATORY_CARE_PROVIDER_SITE_OTHER): Payer: Medicare Other | Admitting: Internal Medicine

## 2015-05-23 ENCOUNTER — Ambulatory Visit (INDEPENDENT_AMBULATORY_CARE_PROVIDER_SITE_OTHER)
Admission: RE | Admit: 2015-05-23 | Discharge: 2015-05-23 | Disposition: A | Discharge status: Home / Self Care | Payer: Medicare Other | Source: Ambulatory Visit | Attending: Internal Medicine | Admitting: Internal Medicine

## 2015-05-23 ENCOUNTER — Encounter: Payer: Self-pay | Admitting: Internal Medicine

## 2015-05-23 ENCOUNTER — Other Ambulatory Visit: Payer: Self-pay | Admitting: Internal Medicine

## 2015-05-23 VITALS — BP 90/60 | HR 88 | Ht 62.0 in | Wt 157.0 lb

## 2015-05-23 DIAGNOSIS — R0602 Shortness of breath: Secondary | ICD-10-CM | POA: Insufficient documentation

## 2015-05-23 DIAGNOSIS — E119 Type 2 diabetes mellitus without complications: Secondary | ICD-10-CM | POA: Diagnosis not present

## 2015-05-23 DIAGNOSIS — R0609 Other forms of dyspnea: Secondary | ICD-10-CM

## 2015-05-23 DIAGNOSIS — I517 Cardiomegaly: Secondary | ICD-10-CM | POA: Diagnosis not present

## 2015-05-23 DIAGNOSIS — J704 Drug-induced interstitial lung disorders, unspecified: Secondary | ICD-10-CM | POA: Diagnosis not present

## 2015-05-23 DIAGNOSIS — I10 Essential (primary) hypertension: Secondary | ICD-10-CM | POA: Diagnosis not present

## 2015-05-23 DIAGNOSIS — I351 Nonrheumatic aortic (valve) insufficiency: Secondary | ICD-10-CM | POA: Insufficient documentation

## 2015-05-23 DIAGNOSIS — R0600 Dyspnea, unspecified: Secondary | ICD-10-CM | POA: Diagnosis present

## 2015-05-23 LAB — CBC WITH DIFFERENTIAL/PLATELET
Basophils Absolute: 0 10*3/uL (ref 0.0–0.1)
Basophils Relative: 0.2 % (ref 0.0–3.0)
Eosinophils Absolute: 0.2 10*3/uL (ref 0.0–0.7)
Eosinophils Relative: 1.8 % (ref 0.0–5.0)
HCT: 42.2 % (ref 36.0–46.0)
Hemoglobin: 14 g/dL (ref 12.0–15.0)
LYMPHS ABS: 1.5 10*3/uL (ref 0.7–4.0)
Lymphocytes Relative: 14.7 % (ref 12.0–46.0)
MCHC: 33.1 g/dL (ref 30.0–36.0)
MCV: 89.3 fl (ref 78.0–100.0)
MONOS PCT: 6 % (ref 3.0–12.0)
Monocytes Absolute: 0.6 10*3/uL (ref 0.1–1.0)
NEUTROS PCT: 77.3 % — AB (ref 43.0–77.0)
Neutro Abs: 7.8 10*3/uL — ABNORMAL HIGH (ref 1.4–7.7)
PLATELETS: 250 10*3/uL (ref 150.0–400.0)
RBC: 4.72 Mil/uL (ref 3.87–5.11)
RDW: 14.1 % (ref 11.5–15.5)
WBC: 10.1 10*3/uL (ref 4.0–10.5)

## 2015-05-23 LAB — BRAIN NATRIURETIC PEPTIDE: PRO B NATRI PEPTIDE: 123 pg/mL — AB (ref 0.0–100.0)

## 2015-05-23 LAB — SEDIMENTATION RATE: SED RATE: 76 mm/h — AB (ref 0–22)

## 2015-05-23 NOTE — Progress Notes (Signed)
Subjective:    Patient ID: Sara [name], female    DOB: 08-Jan-1926,    MRN: [id number]  HPI  69 yowf never smoker with new onset cough x fall 2016 then sob  Jan 2017 worse with walking with PT with GG changes on CT Chest referred to pulmonary clinic 05/23/2015 by Roe Coombs   05/23/2015 1st [facility] office visit/ [name]  maint on macrodantin by Dr Alinda Money  Chief Complaint  Patient presents with  . Pulmonary Consult    Referred by Dr. Anastasia Pall for eval of abnormal ct chest. Pt c/o cough and SOB x 3 wks.  She states she gets SOB walking "not far" and her cough is prod at times with white sputum.    onset of symptoms insidious and progressive x 3-56m, not weeks, first with the cough then the sob.  No obvious day to day or daytime variability or assoc classically pleuritic or ex  cp   chest tightness, subjective wheeze or overt sinus or hb symptoms. No unusual exp hx or h/o childhood pna/ asthma or knowledge of premature birth.  Sleeping ok without nocturnal  or early am exacerbation  of respiratory  c/o's or need for noct saba. Also denies any obvious fluctuation of symptoms with weather or environmental changes or other aggravating or alleviating factors except as outlined above   Current Medications, Allergies, Complete Past Medical History, Past Surgical History, Family History, and Social History were reviewed in [employer] record.             Review of Systems  Constitutional: Positive for appetite change and unexpected weight change. Negative for fever and chills.  HENT: Positive for sore throat. Negative for congestion, dental problem, ear pain, nosebleeds, postnasal drip, rhinorrhea, sinus pressure, sneezing, trouble swallowing and voice change.   Eyes: Negative for visual disturbance.  Respiratory: Positive for cough and shortness of breath. Negative for choking.   Cardiovascular: Positive for chest pain. Negative for leg swelling.    Gastrointestinal: Negative for vomiting, abdominal pain and diarrhea.  Genitourinary: Negative for difficulty urinating.  Musculoskeletal: Negative for arthralgias.  Skin: Negative for rash.  Neurological: Positive for headaches. Negative for tremors and syncope.  Hematological: Does not bruise/bleed easily.       Objective:   Physical Exam   amb wf nad at rest very hoarseness   Wt Readings from Last 3 Encounters:  05/23/15 157 lb (71.215 kg)  05/17/15 156 lb 4.8 oz (70.897 kg)  01/19/15 164 lb (74.39 kg)    Vital signs reviewed    HEENT: nl dentition, turbinates, and oropharynx. Nl external ear canals without cough reflex   NECK :  without JVD/Nodes/TM/ nl carotid upstrokes bilaterally   LUNGS: no acc muscle use,  Nl contour chest  With insp crackles bilaterally    CV:  RRR  no s3 or murmur or increase in P2, no edema   ABD:  soft and nontender with nl inspiratory excursion in the supine position. No bruits or organomegaly, bowel sounds nl  MS:  Nl gait/ ext warm without deformities, calf tenderness, cyanosis or clubbing No obvious joint restrictions   SKIN: warm and dry without lesions    NEURO:  alert, approp, nl sensorium with  no motor deficits    CXR PA and Lateral:   05/23/2015 :    I personally reviewed images and agree with radiology impression as follows:    Enlargement of cardiac silhouette.  Bronchitic changes with progression of peripheral  interstitial infiltrates throughout both lungs since 2011, favor progression of chronic interstitial lung disease/fibrosis over acute peripheral Infiltrates.   Labs ordered/ reviewed:      Chemistry      Component Value Date/Time   NA 141 05/17/2015 1314   K 4.3 05/17/2015 1314   CL 104 05/17/2015 1314   CO2 31 05/17/2015 1314   BUN 14 05/17/2015 1314   CREATININE 0.71 05/17/2015 1314   CREATININE 0.60 04/21/2011 0500      Component Value Date/Time   CALCIUM 9.5 05/17/2015 1314   ALKPHOS 68  04/19/2011 0600   AST 14 04/19/2011 0600   ALT 10 04/19/2011 0600   BILITOT 0.4 04/19/2011 0600        Lab Results  Component Value Date   WBC 10.1 05/23/2015   HGB 14.0 05/23/2015   HCT 42.2 05/23/2015   MCV 89.3 05/23/2015   PLT 250.0 05/23/2015         Lab Results  Component Value Date   TSH 0.645 04/19/2011     Lab Results  Component Value Date   PROBNP 123.0* 05/23/2015       Lab Results  Component Value Date   ESRSEDRATE 76* 05/23/2015   ESRSEDRATE 8 04/19/2011            Assessment & Plan:

## 2015-05-23 NOTE — Patient Instructions (Addendum)
Stop macrodantin indefinitely   Please remember to go to the lab and x-ray department downstairs for your tests - we will call you with the results when they are available.    Try prilosec otc 20mg   Take 30-60 min before first meal of the day and Pepcid ac (famotidine) 20 mg one @  bedtime until cough is completely gone for at least a week without the need for cough suppression  GERD (REFLUX)  is an extremely common cause of respiratory symptoms just like yours , many times with no obvious heartburn at all.    It can be treated with medication, but also with lifestyle changes including elevation of the head of your bed (ideally with 6 inch  bed blocks),  Smoking cessation, avoidance of late meals, excessive alcohol, and avoid fatty foods, chocolate, peppermint, colas, red wine, and acidic juices such as orange juice.  NO MINT OR MENTHOL PRODUCTS SO NO COUGH DROPS  USE SUGARLESS CANDY INSTEAD (Jolley ranchers or Stover's or Life Savers) or even ice chips will also do - the key is to swallow to prevent all throat clearing. NO OIL BASED VITAMINS - use powdered substitutes.   Please schedule a follow up office visit in 4 weeks, sooner if needed

## 2015-05-24 ENCOUNTER — Telehealth: Payer: Self-pay | Admitting: Internal Medicine

## 2015-05-24 DIAGNOSIS — J704 Drug-induced interstitial lung disorders, unspecified: Secondary | ICD-10-CM | POA: Insufficient documentation

## 2015-05-24 NOTE — Telephone Encounter (Signed)
Spoke with the pt's daughter, Izora Gala and notified of results of labs and cxr  She verbalized understanding and denied any questions

## 2015-05-24 NOTE — Assessment & Plan Note (Signed)
Developed while on marcrodantin with onset Fall 2016 > d/c'd 05/23/2015 with ESR 76 but no desats walking on that date   So this is macodantin lung dz until proven otherwise.   DDx for pulmonary fibrosis  includes idiopathic pulmonary fibrosis, pulmonary fibrosis associated with rheumatologic diseases (which have a relatively benign course in most cases) , adverse effect from  drugs such as chemotherapy or amiodarone exposure, nonspecific interstitial pneumonia which is typically steroid responsive, and chronic hypersensitivity pneumonitis.   In active  smokers Langerhan's Cell  Histiocyctosis (eosinophilic granuomatosis),  DIP,  and Respiratory Bronchiolitis ILD also need to be considered,  But don't apply here   Use of PPI is associated with improved survival time and with decreased radiologic fibrosis per King's study published in AJRCCM vol 184 p1390.  Dec 2011 and also may have other beneficial effects as per the latest review in [place] vol 193 A4536 Jun 20016.  This may not always be cause and effect, but given how universally unimpressive and expensive  all the other  Drugs developed to day  have been for pf,   rec start  rx ppi / diet/ lifestyle modification and f/u with serial walking sats and lung volumes for now to put more points on the curve / establish firm baseline before considering additional measures.   For now therefore rx gerd / hold macrodantin and f/u in 4 weeks with option of short steroid rx if condition worsens  Total time devoted to counseling  = 35/52mreview case with pt/ discussion of options/alternatives/ personally creating in presence of pt  then going over specific  Instructions directly with the pt including how to use all of the meds but in particular covering each new medication in detail (see avs)

## 2015-05-24 NOTE — Progress Notes (Signed)
Quick Note:  Spoke with pt and notified of results per Dr. Wert. Pt verbalized understanding and denied any questions.  ______ 

## 2015-05-24 NOTE — Telephone Encounter (Signed)
LMTC x 1  

## 2015-05-24 NOTE — Progress Notes (Signed)
Quick Note:  LMTCB ______ 

## 2015-05-24 NOTE — Telephone Encounter (Signed)
[phone] calling back

## 2015-05-24 NOTE — Assessment & Plan Note (Signed)
05/23/2015   Walked RA x one lap @ 185 stopped due to  Sob, slow pace, no desat  See ild (see separate a/p)

## 2015-05-26 ENCOUNTER — Telehealth: Payer: Self-pay | Admitting: Internal Medicine

## 2015-05-26 LAB — RESPIRATORY ALLERGY PROFILE REGION II ~~LOC~~
Allergen, Cedar tree, t12: <0.1 kU/L
Allergen, Comm Silver Birch, t9: <0.1 kU/L
Allergen, Mouse Urine Protein, e78: <0.1 kU/L
Alternaria Alternata: <0.1 kU/L
Aspergillus fumigatus, m3: <0.1 kU/L
Box Elder IgE: <0.1 kU/L
Common Ragweed: <0.1 kU/L
IGE (IMMUNOGLOBULIN E), SERUM: 3 kU/L (ref <115)
Johnson Grass: <0.1 kU/L
Pecan/Hickory Tree IgE: <0.1 kU/L
Penicillium Notatum: <0.1 kU/L
Rough Pigweed  IgE: <0.1 kU/L
Sheep Sorrel IgE: <0.1 kU/L
Timothy Grass: <0.1 kU/L

## 2015-05-26 NOTE — Telephone Encounter (Signed)
Pt seen by Dr. Debara Pickett on 1/31. Holter monitor worn.  Coralyn Mark called to report finding of new onset atrial fibrillation observed on monitor. Results processing & should be viewable today. She is aware I am sending to physician for review.  Pt has f/u w/ Dr. Debara Pickett on 2/14.

## 2015-05-26 NOTE — Telephone Encounter (Signed)
PAF is a known diagnosis. Not anticoagulated due to fall risk. Keep f/u appt please MCr

## 2015-05-26 NOTE — Telephone Encounter (Signed)
Sara [name] is calling in to report an abnormal Holter monitor. Please f/u with her  Thanks

## 2015-05-27 NOTE — Telephone Encounter (Signed)
Follow up   Pt daughter is calling for rn to call back she wants information on montor

## 2015-05-27 NOTE — Progress Notes (Signed)
Quick Note:  Spoke with pt and notified of results per Dr. Wert. Pt verbalized understanding and denied any questions.  ______ 

## 2015-05-27 NOTE — Telephone Encounter (Signed)
Returned call. Daughter advised monitor results pending. Echo results were communicated.  Pt has f/u 2/14 to discuss findings.

## 2015-05-31 ENCOUNTER — Ambulatory Visit (INDEPENDENT_AMBULATORY_CARE_PROVIDER_SITE_OTHER): Payer: Medicare Other | Admitting: Internal Medicine

## 2015-05-31 ENCOUNTER — Encounter: Payer: Self-pay | Admitting: Internal Medicine

## 2015-05-31 VITALS — BP 118/58 | HR 80 | Ht 61.0 in | Wt 152.0 lb

## 2015-05-31 DIAGNOSIS — I4510 Unspecified right bundle-branch block: Secondary | ICD-10-CM | POA: Diagnosis not present

## 2015-05-31 DIAGNOSIS — J704 Drug-induced interstitial lung disorders, unspecified: Secondary | ICD-10-CM

## 2015-05-31 DIAGNOSIS — I480 Paroxysmal atrial fibrillation: Secondary | ICD-10-CM

## 2015-05-31 DIAGNOSIS — I517 Cardiomegaly: Secondary | ICD-10-CM

## 2015-05-31 DIAGNOSIS — E1159 Type 2 diabetes mellitus with other circulatory complications: Secondary | ICD-10-CM | POA: Diagnosis not present

## 2015-05-31 DIAGNOSIS — I10 Essential (primary) hypertension: Secondary | ICD-10-CM

## 2015-05-31 MED ORDER — FUROSEMIDE 20 MG PO TABS: 20.0000 mg | ORAL_TABLET | Freq: Every day | ORAL | Status: AC | PRN

## 2015-05-31 NOTE — Patient Instructions (Signed)
Your physician wants you to follow-up in: 6 months with Dr. Debara Pickett. You will receive a reminder letter in the mail two months in advance. If you don't receive a letter, please call our office to schedule the follow-up appointment.  Your physician has recommended you make the following change in your medication...  1. Dr. Debara Pickett has ordered FUROSEMIDE (lasix) 20mg  daily as needed for swelling

## 2015-06-01 NOTE — Progress Notes (Signed)
Patient ID: Sara [name], female   DOB: 1926-03-22, 82 y.o.   MRN: [id number]    OFFICE NOTE  Chief Complaint:  Improve dyspnea  Primary Care Physician: Aura Dials, PA-C  HPI:  Sara [name] is an 82 year old female who has had a history of falls due to imbalance and a history of dementia with some worsening memory loss. She had a history of paroxysmal atrial fibrillation which was very short lived and converted, possibly thought to be in the setting of acute cholecystitis. She has been on aspirin alone due to her falls and is not felt to be a good Coumadin candidate. In the past, she had been on Lasix up to 40 mg daily. This was, however, decreased due to excessive urination and eventually stopped.   Today she presents for annual follow-up. Has had issues with back and knees. Has constant pain in these areas. Has discussed with PCP and has been treated with everything per the patient. They have recently tried a topical cream. States they do not want to try anything stronger. Fell 2 weeks ago and went to the ED. Had CT head and XR right knee.    Sara [name] has no complaints today. Overall she is feeling fairly well. The memory continues to decline somewhat. She's had no recent falls. In the past and taken off her statin and I do not see clear indications for her to continue to be on fish oil and niacin.   Sara [name] returns today accompanied by her daughter. Recently she's had a fall and was seen in the [facility]. She underwent a CT scan of her chest and head. The chest CT did not indicate any fractures although she was complaining of left chest pain. She was noted to have some aortic and coronary atherosclerosis as well as mild cardiomegaly. There were diffuse groundglass changes on the CT scan concerning for interstitial findings. Sara [name] reports some worsening shortness of breath recently. Her daughter notes that some of her pain complaints of been out of proportion and may be related  to her dementia. She is also on Seroquel, possibly for behavioral disturbance or sleep problems.  Sara [name] returns today for follow-up. In the interim she had an echocardiogram which shows some LVH but normal systolic function. There is mild diastolic dysfunction. Her CT indicated interstitial findings and she saw Dr. Melvyn Novas with pulmonary. He felt she has interstitial lung disease possibly related to long-term Macrodantin use. She discontinued that medicine is ready felt some improvement in her shortness of breath. She's had 2 BNP studies and although she appears to be euvolemic on exam her BNP is very mildly elevated, just slightly above 100. This could suggest some elevated filling pressures or an element of diastolic heart failure.  PMHx:  Past Medical History  Diagnosis Date  . Cancer [facility])     breast - left  . Arthritis   . Diabetes mellitus     type 2  . Hypertension   . Generalized headaches   . Hearing loss   . Sore throat   . Confusion   . Weakness   . Mental disorder   . DEMENTIA   . Stroke [facility]) 02-23-2011    recently diagnosed with mini-strokes  . PAF (paroxysmal atrial fibrillation) ([place])     remote history, thought to be r/t acute cholecystits  . Frequent falls   . Dyslipidemia   . RBBB   . History of nuclear stress test 2000    persantine; low  risk scan   . TIA (transient ischemic attack)   . Gait disturbance   . Lumbago   . Memory loss     Past Surgical History  Procedure Laterality Date  . Back surgery      lower  . Vericose veins    . Abdominal hysterectomy    . Breast surgery    . Wrist surgery    . Cardiac catheterization  02/07/2010    mild CAD  . Fracture surgery  01/2011    left arm   . Mastectomy      Left breast with lymph node removeal  . Laparoscopic cholecystectomy  02/11/2010  . Transthoracic echocardiogram  04/20/2011    EF 55-60%; mildly thickened AV leaflets;     FAMHx:  Family History  Problem Relation Age of Onset  . Diabetes  type II Son   . Emphysema Brother     smoked    SOCHx:   reports that she has never smoked. She has never used smokeless tobacco. She reports that she does not drink alcohol or use illicit drugs.  ALLERGIES:  Allergies  Allergen Reactions  . Codeine Nausea Only and Other (See Comments)    In addition, most pain medications cause confusion, make patient light headed.    ROS: A comprehensive review of systems was negative except for: Respiratory: positive for stable dyspnea on exertion Musculoskeletal: positive for arthralgias and back pain  HOME MEDS: Current Outpatient Prescriptions  Medication Sig Dispense Refill  . acetaminophen (TYLENOL) 325 MG tablet Take 650 mg by mouth every 4 (four) hours as needed. pain    . aspirin 81 MG tablet Take 81 mg by mouth daily.    Marland Kitchen b complex vitamins tablet Take 1 tablet by mouth daily.    . calcium carbonate (OS-CAL) 600 MG TABS tablet Take 600 mg by mouth daily with breakfast.    . Chlorpheniramine Maleate (ALLERGY PO) Take 10 mg by mouth daily.    Marland Kitchen diltiazem (CARDIZEM CD) 120 MG 24 hr capsule Take 120 mg by mouth daily.     Marland Kitchen donepezil (ARICEPT) 10 MG tablet TAKE 1 TABLET (10 MG TOTAL) BY MOUTH DAILY. 30 tablet 5  . DULoxetine (CYMBALTA) 30 MG capsule Take 1 capsule by mouth daily.    Marland Kitchen FLUoxetine (PROZAC) 10 MG capsule Take 10 mg by mouth daily.    Marland Kitchen KLOR-CON M20 20 MEQ tablet Take 20 mEq by mouth daily.     . magnesium oxide (MAG-OX) 400 MG tablet Take 400 mg by mouth daily.    . memantine (NAMENDA) 10 MG tablet Take 1 tablet (10 mg total) by mouth 2 (two) times daily. 60 tablet 3  . Misc Natural Products (OSTEO BI-FLEX ADV DOUBLE ST PO) Take 1 capsule by mouth 2 (two) times daily.    . QUEtiapine (SEROQUEL) 25 MG tablet Take 12.5 mg by mouth at bedtime.    . furosemide (LASIX) 20 MG tablet Take 1 tablet (20 mg total) by mouth daily as needed for edema. 90 tablet 0   No current facility-administered medications for this visit.     LABS/IMAGING: No results found for this or any previous visit (from the past 48 hour(s)). No results found.  VITALS: BP 118/58 mmHg  Pulse 80  Ht 5\' 1"  (1.549 m)  Wt 152 lb (68.947 kg)  BMI 28.74 kg/m2  EXAM: deferred  EKG: Deferred  ASSESSMENT: 1. HTN 2. Hyperlipidemia- not actively treating due to weight loss 3. Bifascicular block 4. Paroxysmal a-fib,  not on warfarin due to fall risk 5. Dementia 6. Interstitial lung disease 7. Diastolic CHF - LVH  PLAN: 1.   Sara [name] has had shortness of breath related to interstitial lung disease on Macrodantin. This is been discontinued. She may have some mild congestive heart failure which is diastolic. Echo shows some LVH which explains her cardiomegaly. She really has had improvement after stopping her Macrodantin. I will provide Lasix for her to use as needed for worsening shortness of breath or lower extremity swelling. Plan follow-up in 6 months.  Pixie Casino, MD, [facility] Attending Cardiologist [facility] 06/01/2015, 8:22 AM

## 2015-06-06 ENCOUNTER — Encounter: Payer: Self-pay | Admitting: Endocrinology

## 2015-06-06 ENCOUNTER — Ambulatory Visit (INDEPENDENT_AMBULATORY_CARE_PROVIDER_SITE_OTHER): Payer: Medicare Other | Admitting: Endocrinology

## 2015-06-06 VITALS — BP 128/76 | HR 59 | Temp 98.2°F | Ht 61.0 in | Wt 158.0 lb

## 2015-06-06 DIAGNOSIS — E041 Nontoxic single thyroid nodule: Secondary | ICD-10-CM

## 2015-06-06 LAB — TSH: TSH: 0.82 u[IU]/mL (ref 0.35–4.50)

## 2015-06-06 LAB — T4, FREE: Free T4: 1.03 ng/dL (ref 0.60–1.60)

## 2015-06-06 NOTE — Patient Instructions (Signed)
blood tests are requested for you today.  We'll let you know about the results. If it is overactive, i'll prescribe a thyroid pill, and then please come back for a follow-up appointment in 1 month. If it is normal, no treatment is needed now, so please come back for a follow-up appointment in 6 months.

## 2015-06-06 NOTE — Progress Notes (Signed)
Subjective:    Patient ID: Sara [name], female    DOB: 13-Oct-1925, 82 y.o.   MRN: [id number]  HPI In jan of 2017, pt was incidentally noted on a CT scan to have a nodule at the right thyroid lobe.  She has slight assoc pain.  she is unaware of ever having had thyroid problems in the past.  she has no h/o XRT or surgery to the neck.   Past Medical History  Diagnosis Date  . Cancer [facility])     breast - left  . Arthritis   . Diabetes mellitus     type 2  . Hypertension   . Generalized headaches   . Hearing loss   . Sore throat   . Confusion   . Weakness   . Mental disorder   . DEMENTIA   . Stroke [facility]) 02-23-2011    recently diagnosed with mini-strokes  . PAF (paroxysmal atrial fibrillation) ([place])     remote history, thought to be r/t acute cholecystits  . Frequent falls   . Dyslipidemia   . RBBB   . History of nuclear stress test 2000    persantine; low risk scan   . TIA (transient ischemic attack)   . Gait disturbance   . Lumbago   . Memory loss     Past Surgical History  Procedure Laterality Date  . Back surgery      lower  . Vericose veins    . Abdominal hysterectomy    . Breast surgery    . Wrist surgery    . Cardiac catheterization  02/07/2010    mild CAD  . Fracture surgery  01/2011    left arm   . Mastectomy      Left breast with lymph node removeal  . Laparoscopic cholecystectomy  02/11/2010  . Transthoracic echocardiogram  04/20/2011    EF 55-60%; mildly thickened AV leaflets;     Social History   Social History  . Marital Status: Widowed    Spouse Name: N/A  . Number of Children: 3  . Years of Education: 12   Occupational History  . Retired     retired.   Social History Main Topics  . Smoking status: Never Smoker   . Smokeless tobacco: Never Used  . Alcohol Use: No  . Drug Use: No  . Sexual Activity: Not on file   Other Topics Concern  . Not on file   Social History Narrative   Patient is retired and lives at home alone. Patient  has a high school education. Patient has three children.   Caffeine-    Current Outpatient Prescriptions on File Prior to Visit  Medication Sig Dispense Refill  . acetaminophen (TYLENOL) 325 MG tablet Take 650 mg by mouth every 4 (four) hours as needed. pain    . aspirin 81 MG tablet Take 81 mg by mouth daily.    Marland Kitchen b complex vitamins tablet Take 1 tablet by mouth daily.    . calcium carbonate (OS-CAL) 600 MG TABS tablet Take 600 mg by mouth daily with breakfast.    . Chlorpheniramine Maleate (ALLERGY PO) Take 10 mg by mouth daily.    Marland Kitchen diltiazem (CARDIZEM CD) 120 MG 24 hr capsule Take 120 mg by mouth daily.     Marland Kitchen donepezil (ARICEPT) 10 MG tablet TAKE 1 TABLET (10 MG TOTAL) BY MOUTH DAILY. 30 tablet 5  . DULoxetine (CYMBALTA) 30 MG capsule Take 1 capsule by mouth daily.    Marland Kitchen  FLUoxetine (PROZAC) 10 MG capsule Take 10 mg by mouth daily.    . furosemide (LASIX) 20 MG tablet Take 1 tablet (20 mg total) by mouth daily as needed for edema. 90 tablet 0  . KLOR-CON M20 20 MEQ tablet Take 20 mEq by mouth daily.     . magnesium oxide (MAG-OX) 400 MG tablet Take 400 mg by mouth daily.    . memantine (NAMENDA) 10 MG tablet Take 1 tablet (10 mg total) by mouth 2 (two) times daily. 60 tablet 3  . Misc Natural Products (OSTEO BI-FLEX ADV DOUBLE ST PO) Take 1 capsule by mouth 2 (two) times daily.    . QUEtiapine (SEROQUEL) 25 MG tablet Take 12.5 mg by mouth at bedtime.     No current facility-administered medications on file prior to visit.    Allergies  Allergen Reactions  . Codeine Nausea Only and Other (See Comments)    In addition, most pain medications cause confusion, make patient light headed.    Family History  Problem Relation Age of Onset  . Diabetes type II Son   . Emphysema Brother     smoked  . Thyroid disease Daughter   . Thyroid disease Son     BP 128/76 mmHg  Pulse 59  Temp(Src) 98.2 F (36.8 C) (Oral)  Ht 5\' 1"  (1.549 m)  Wt 158 lb (71.668 kg)  BMI 29.87 kg/m2  SpO2  90%   Review of Systems Denies hoarseness, visual loss, chest pain, sob, dysphagia, and skin rash.  She has lost 60 lbs x 3 years.  She has easy bruising, cold intolerance, rhinorrhea, chronic headache, and chronic depression.      Objective:   Physical Exam VS: see vs page GEN: no distress HEAD: head: no deformity eyes: no periorbital swelling, no proptosis external nose and ears are normal mouth: no lesion seen NECK: supple, thyroid is not enlarged CHEST WALL: no deformity LUNGS:  Clear to auscultation, except for coarse rales at the bases. CV: reg rate and rhythm, no murmur ABD: abdomen is soft, nontender.  no hepatosplenomegaly.  not distended.  no hernia MUSCULOSKELETAL: muscle bulk and strength are grossly normal.  no obvious joint swelling.  gait is steady with a walker EXTEMITIES: no deformity.  no ulcer on the feet.  feet are of normal color and temp.  no edema, but there are bilat varicosities PULSES: dorsalis pedis intact bilat.  NEURO:  cn 2-12 grossly intact, except for hearing loss.   readily moves all 4's.  sensation is intact to touch on the feet SKIN:  Normal texture and temperature.  No rash or suspicious lesion is visible.  Few ecchymoses on the forearms.  NODES:  None palpable at the neck.  PSYCH: alert, well-oriented.  Does not appear anxious nor depressed.    TSH (2012)=0.31  Lab Results  Component Value Date   TSH 0.82 06/06/2015      Assessment & Plan:  Thyroid nodule, new.  prob a hyperfunctioning adenoma.   Hyperthyroidism: mild, intermittent, prob due to the nodule Frail elderly state: in this setting, she does not need w/u of nodule--just rx of any assoc hyperthyroidism.    Patient is advised the following: Patient Instructions  blood tests are requested for you today.  We'll let you know about the results. If it is overactive, i'll prescribe a thyroid pill, and then please come back for a follow-up appointment in 1 month. If it is normal, no  treatment is needed now, so please come back  for a follow-up appointment in 6 months.

## 2015-06-16 ENCOUNTER — Other Ambulatory Visit: Payer: Self-pay | Admitting: Adult Health

## 2015-07-20 ENCOUNTER — Ambulatory Visit (INDEPENDENT_AMBULATORY_CARE_PROVIDER_SITE_OTHER): Payer: Medicare Other | Admitting: Adult Health

## 2015-07-20 ENCOUNTER — Encounter: Payer: Self-pay | Admitting: Adult Health

## 2015-07-20 VITALS — BP 130/70 | HR 78 | Resp 20 | Ht 62.0 in | Wt 146.0 lb

## 2015-07-20 DIAGNOSIS — F0390 Unspecified dementia without behavioral disturbance: Secondary | ICD-10-CM | POA: Diagnosis not present

## 2015-07-20 MED ORDER — DONEPEZIL HCL 10 MG PO TABS: ORAL_TABLET | ORAL | Status: DC

## 2015-07-20 NOTE — Patient Instructions (Signed)
Continue namenda and aricept  Memory score is stable If your symptoms worsen or you develop new symptoms please let us know.

## 2015-07-20 NOTE — Progress Notes (Signed)
PATIENT: Sara [name] DOB: [birth year]  REASON FOR VISIT: follow up- memory loss HISTORY FROM: patient  HISTORY OF PRESENT ILLNESS: Sara [name] is an 82 year old female with a history of mild memory loss. She returns today for follow-up. She continues to take Aricept and Namenda. Her daughter feels that her memory may have gotten worse. She does require some assistance with ADLs. The family is currently trying to get a aide come in 3 times a week to help her with bathing and her meals. In the past the patient has burned her food when she tried to cook alone. She lives at home alone but has family that stays with her at night. She is currently on Seroquel. Denies any agitation or aggressiveness. Family reports that she has been having hallucinations. She occasionally will cease up out in the yard. Her daughter reports that recently she saw rhinoceros in the backyard. The patient and her daughter states that none of the hallucinations are violent or fearful. She states that she sleeps well. She does not operate a motor vehicle. She returns today for an evaluation.  HISTORY 01/19/15 (MM): Sara [name] is an 82 year old female with a history of mild memory loss. She returns today for follow-up. Patient continues to take Aricept and Namenda. She reports that she is tolerating this medication well. The patient reports that her memory has continued to decline. She is able to complete all ADLs independent. She lives at home alone however she has family that stays with her at night. She is able to prepare her own meals. Patient and family deny agitation or aggressiveness. She does take Seroquel at bedtime. She states that she has a headache today as well as back pain. She states that she almost canceled her appointment because she is not feeling well. She returns today for an evaluation.  HISTORY  07/14/14: Sara [name] is an 82 year old female with a history of mild memory loss. She returns today for  follow-up. The patient continues to take Aricept 10 mg daily as well as Namenda 10 mg twice a day. She states that she is tolerating this medication very well. The patient states that her memory is "bad." She states at home she is able to complete all ADLs independently. She lives in her home however her children will stay with her at night. She states that she continues to cook her own meals but occasionally her family will provide meals for her. She does not operate a motor vehicle. Her daughter states that at times she'll have vivid dreams at night that will cause her to wander and become agitated. The patient continues to take Seroquel at bedtime. Daughter does report that she's had some weight loss. She states that her mom only eats 2 meals a day mainly because she does not get up in time for breakfast. She returns today for an evaluation.  HISTORY 01/13/14:Sara [name] is a 82 year old female with a history of mild memory loss. She returns today for follow-up. She is currently taking Aricept 10 mg daily. She also takes namenda 10 mg BID but states that it is very expensive. She states that her memory has remained the same. She is able to complete all ADLS independently. She lives alone except at night her children stay with her. She cooks all her meals without difficulty. Although she sometimes turns on the wrong burner and will burn her burner covers. Her daughter does her finances. She no longer operates a motor vehicle. She  is sleeping well at night. She continues to use the Seroquel at night for agtiation. Daughter states that has been controlled.   HISTORY 01/12/13 (YY): Sara [name] is a 82 year old right-handed Caucasian female, accompanied by her daughter, referred by primary care physician for evaluation of short-term memory trouble  She has past medical history of hypertension, diabetes, CAT scan of the brain without contrast has demonstrated atrophy, modereate periventricular small vessel  disease,  She had 12 years of education, worked at [employer], retired in 1989, has been active, widowed 12 years ago, still driving, daughter noticed she has gradual onset of memory trouble over past two years, also because of her chronic low back pain knee pain, has become sedentary, she fell a month ago, with left wrist fracture, status post surgical fixation, she had increased forgetfulness, occasionally confusion,  She still able to drive short distance, daughter helps her with bills now, she tends to make mistakes, she can still use her phone live independently,  there is no family history of dementia or, she has 3 brothers and 3 sisters, Her mother died of congestive heart failure at age 18, her father died of gastric ulcer at age 42, besides this TIA episode, there was no history of stroke like symptoms, there was no sudden stepwise decline of her mentation.  She was taken to [facility] emergency room in April 18 2011, she was noticed to have sudden onset language difficulty, sounds like paraphasic errors, no loss of consciousness, no limb muscle weakness, lasting 5 minutes, she had extensive evaluation, MRI of the brain has demonstrate extensive white matter disease, MRA of the brain has demonstrate atherosclerotic disease, echocardiogram was normal, ejection fraction 55-60%, ultrasound of carotid artery no significant carotid artery stenosis,  She is now back to her baseline, daughter reported mild improvement with donepezil, she is less confused, she was also givenseroquel 25 mg every night, which helps her sleep, otherwise she has difficulty falling sleep, sometimes hear people calling her name, baby crying  UPDATE Sept 29th 2014:  She still lives alone, overall doing very well, she is taking donepezil 10 mg every day, Namenda 10 mg twice a day, daughter reported financial burden was Namenda years, she is sleeping well, continue to take seroquel 25 mg every day,  REVIEW OF SYSTEMS:  Out of a complete 14 system review of symptoms, the patient complains only of the following symptoms, and all other reviewed systems are negative.  Incontinence of bladder, joint pain, back pain, aching muscles, walking difficulty, sleep talking, acting out dreams, agitation, confusion, decreased concentration, weakness, tremors, headache, memory loss, restless bleed easily, cold intolerance, eye itching, fatigue, hearing loss, runny nose  ALLERGIES: Allergies  Allergen Reactions  . Codeine Nausea Only and Other (See Comments)    In addition, most pain medications cause confusion, make patient light headed.    HOME MEDICATIONS: Outpatient Prescriptions Prior to Visit  Medication Sig Dispense Refill  . acetaminophen (TYLENOL) 325 MG tablet Take 650 mg by mouth every 4 (four) hours as needed. pain    . aspirin 81 MG tablet Take 81 mg by mouth daily.    Marland Kitchen b complex vitamins tablet Take 1 tablet by mouth daily.    . calcium carbonate (OS-CAL) 600 MG TABS tablet Take 600 mg by mouth daily with breakfast.    . Chlorpheniramine Maleate (ALLERGY PO) Take 10 mg by mouth daily.    Marland Kitchen diltiazem (CARDIZEM CD) 120 MG 24 hr capsule Take 120 mg by mouth daily.     Marland Kitchen  donepezil (ARICEPT) 10 MG tablet TAKE 1 TABLET (10 MG TOTAL) BY MOUTH DAILY. 30 tablet 5  . DULoxetine (CYMBALTA) 30 MG capsule Take 1 capsule by mouth daily.    Marland Kitchen FLUoxetine (PROZAC) 10 MG capsule Take 10 mg by mouth daily.    . furosemide (LASIX) 20 MG tablet Take 1 tablet (20 mg total) by mouth daily as needed for edema. 90 tablet 0  . KLOR-CON M20 20 MEQ tablet Take 20 mEq by mouth daily.     . magnesium oxide (MAG-OX) 400 MG tablet Take 400 mg by mouth daily.    . memantine (NAMENDA) 10 MG tablet TAKE 1 TABLET (10 MG TOTAL) BY MOUTH 2 (TWO) TIMES DAILY. 60 tablet 3  . Misc Natural Products (OSTEO BI-FLEX ADV DOUBLE ST PO) Take 1 capsule by mouth 2 (two) times daily.    . QUEtiapine (SEROQUEL) 25 MG tablet Take 12.5 mg by mouth at  bedtime.     No facility-administered medications prior to visit.    PAST MEDICAL HISTORY: Past Medical History  Diagnosis Date  . Cancer [facility])     breast - left  . Arthritis   . Diabetes mellitus     type 2  . Hypertension   . Generalized headaches   . Hearing loss   . Sore throat   . Confusion   . Weakness   . Mental disorder   . DEMENTIA   . Stroke [facility]) 02-23-2011    recently diagnosed with mini-strokes  . PAF (paroxysmal atrial fibrillation) ([place])     remote history, thought to be r/t acute cholecystits  . Frequent falls   . Dyslipidemia   . RBBB   . History of nuclear stress test 2000    persantine; low risk scan   . TIA (transient ischemic attack)   . Gait disturbance   . Lumbago   . Memory loss     PAST SURGICAL HISTORY: Past Surgical History  Procedure Laterality Date  . Back surgery      lower  . Vericose veins    . Abdominal hysterectomy    . Breast surgery    . Wrist surgery    . Cardiac catheterization  02/07/2010    mild CAD  . Fracture surgery  01/2011    left arm   . Mastectomy      Left breast with lymph node removeal  . Laparoscopic cholecystectomy  02/11/2010  . Transthoracic echocardiogram  04/20/2011    EF 55-60%; mildly thickened AV leaflets;     FAMILY HISTORY: Family History  Problem Relation Age of Onset  . Diabetes type II Son   . Emphysema Brother     smoked  . Thyroid disease Daughter   . Thyroid disease Son     SOCIAL HISTORY: Social History   Social History  . Marital Status: Widowed    Spouse Name: N/A  . Number of Children: 3  . Years of Education: 12   Occupational History  . Retired     retired.   Social History Main Topics  . Smoking status: Never Smoker   . Smokeless tobacco: Never Used  . Alcohol Use: No  . Drug Use: No  . Sexual Activity: Not on file   Other Topics Concern  . Not on file   Social History Narrative   Patient is retired and lives at home alone. Patient has a high school  education. Patient has three children.   Caffeine-      PHYSICAL EXAM  Filed Vitals:   07/20/15 1140  BP: 130/70  Pulse: 78  Resp: 20  Height: 5\' 2"  (1.575 m)  Weight: 146 lb (66.225 kg)   Body mass index is 26.7 kg/(m^2).   MMSE - Mini Mental State Exam 07/20/2015 01/19/2015 07/14/2014  Orientation to time 1 1 4   Orientation to Place 3 3 4   Registration 3 3 3   Attention/ Calculation 5 5 5   Recall 1 1 2   Language- name 2 objects 2 2 2   Language- repeat 1 1 1   Language- follow 3 step command 3 2 3   Language- read & follow direction 1 1 1   Write a sentence 1 1 1   Copy design 0 0 0  Total score 21 20 26      Generalized: Well developed, in no acute distress   Neurological examination  Mentation: Alert  Follows all commands speech and language fluent Cranial nerve II-XII: Pupils were equal round reactive to light. Extraocular movements were full, visual field were full on confrontational test. Facial sensation and strength were normal. Uvula tongue midline. Head turning and shoulder shrug  were normal and symmetric. Motor: The motor testing reveals 5 over 5 strength of all 4 extremities. Good symmetric motor tone is noted throughout.  Sensory: Sensory testing is intact to soft touch on all 4 extremities. No evidence of extinction is noted.  Coordination: Cerebellar testing reveals good finger-nose-finger and heel-to-shin bilaterally.  Gait and station: Gait is slightly unsteady. She uses a walker when ambulating. She requires assistance when standing. Tandem gait not attended. Reflexes: Deep tendon reflexes are symmetric and normal bilaterally.   DIAGNOSTIC DATA (LABS, IMAGING, TESTING) - I reviewed patient records, labs, notes, testing and imaging myself where available.  Lab Results  Component Value Date   WBC 10.1 05/23/2015   HGB 14.0 05/23/2015   HCT 42.2 05/23/2015   MCV 89.3 05/23/2015   PLT 250.0 05/23/2015      Component Value Date/Time   NA 141 05/17/2015  1314   K 4.3 05/17/2015 1314   CL 104 05/17/2015 1314   CO2 31 05/17/2015 1314   GLUCOSE 108* 05/17/2015 1314   BUN 14 05/17/2015 1314   CREATININE 0.71 05/17/2015 1314   CREATININE 0.60 04/21/2011 0500   CALCIUM 9.5 05/17/2015 1314   PROT 6.1 04/19/2011 0600   ALBUMIN 3.0* 04/19/2011 0600   AST 14 04/19/2011 0600   ALT 10 04/19/2011 0600   ALKPHOS 68 04/19/2011 0600   BILITOT 0.4 04/19/2011 0600   GFRNONAA 82* 04/21/2011 0500   GFRAA >90 04/21/2011 0500     Lab Results  Component Value Date   TSH 0.82 06/06/2015      ASSESSMENT AND PLAN 82 y.o. year old female  has a past medical history of Cancer ([place]); Arthritis; Diabetes mellitus; Hypertension; Generalized headaches; Hearing loss; Sore throat; Confusion; Weakness; Mental disorder; DEMENTIA; Stroke ([place]) (02-23-2011); PAF (paroxysmal atrial fibrillation) ([place]); Frequent falls; Dyslipidemia; RBBB; History of nuclear stress test (2000); TIA (transient ischemic attack); Gait disturbance; Lumbago; and Memory loss. here with:  1. Dementia without behavioral disturbance  The patient's memory score has remained stable. Her MMSE today is 21/30 was previously 20/30. She will continue on Aricept and Namenda. Patient and her daughter advise if her hallucinations become violent or fearful that she'll let us know. We will continue to monitor her symptoms. She will follow-up in 6 months with Dr. Krista Blue.    Ward Givens, MSN, NP-C 07/20/2015, 11:47 AM [facility] [address], [place],  [place] [address] [phone]

## 2015-07-21 NOTE — Progress Notes (Signed)
I have reviewed and agreed above plan. 

## 2015-07-28 ENCOUNTER — Other Ambulatory Visit: Payer: Self-pay | Admitting: Adult Health

## 2015-07-29 ENCOUNTER — Emergency Department (HOSPITAL_COMMUNITY)
Admission: EM | Admit: 2015-07-29 | Discharge: 2015-07-30 | Disposition: A | Discharge status: Home / Self Care | Payer: Medicare Other | Attending: Emergency Medicine | Admitting: Emergency Medicine

## 2015-07-29 ENCOUNTER — Emergency Department (HOSPITAL_COMMUNITY): Payer: Medicare Other

## 2015-07-29 ENCOUNTER — Encounter (HOSPITAL_COMMUNITY): Payer: Self-pay

## 2015-07-29 DIAGNOSIS — Z8673 Personal history of transient ischemic attack (TIA), and cerebral infarction without residual deficits: Secondary | ICD-10-CM | POA: Diagnosis not present

## 2015-07-29 DIAGNOSIS — E119 Type 2 diabetes mellitus without complications: Secondary | ICD-10-CM | POA: Insufficient documentation

## 2015-07-29 DIAGNOSIS — I10 Essential (primary) hypertension: Secondary | ICD-10-CM | POA: Diagnosis not present

## 2015-07-29 DIAGNOSIS — M25551 Pain in right hip: Secondary | ICD-10-CM | POA: Diagnosis not present

## 2015-07-29 DIAGNOSIS — M1990 Unspecified osteoarthritis, unspecified site: Secondary | ICD-10-CM | POA: Diagnosis not present

## 2015-07-29 DIAGNOSIS — M545 Low back pain, unspecified: Secondary | ICD-10-CM

## 2015-07-29 NOTE — ED Provider Notes (Signed)
CSN: [id number]     Arrival date & time 07/29/15  2218 History  By signing my name below, I, Terressa Koyanagi, attest that this documentation has been prepared under the direction and in the presence of Rolland Porter, MD at 2310 PM. Electronically Signed: Terressa Koyanagi, ED Scribe. 07/30/2015. 11:54 PM.   Chief Complaint  Patient presents with  . Hip Pain   The history is provided by the patient and a relative. No language interpreter was used.    Level V caveat for dementia  PCP: SPENCER,SARA C, PA-C HPI Comments: Sara [name] is a 82 y.o. female, with PMHx noted below including arthritis of bilateral knees, dementia with worsening memory loss, falls due to imbalance, gait disturbance, and walker use at baseline, who presents to the Emergency Department, accompanied by her son and relative, complaining of atraumatic right hip pain onset yesterday. Relative reports at baseline pt is able to get herself out of bed, however, yesterday she had difficulty getting out of bed and today she was unable to get out of bed all. Associated Sx include low back pain.   PCP Dr Bradd Burner Dr Layne Benton Neurosurgery  Dr Luiz Ochoa, Narda Amber neurosurgery   Past Medical History  Diagnosis Date  . Cancer [facility])     breast - left  . Arthritis   . Diabetes mellitus     type 2  . Hypertension   . Generalized headaches   . Hearing loss   . Sore throat   . Confusion   . Weakness   . Mental disorder   . DEMENTIA   . Stroke [facility]) 02-23-2011    recently diagnosed with mini-strokes  . PAF (paroxysmal atrial fibrillation) ([place])     remote history, thought to be r/t acute cholecystits  . Frequent falls   . Dyslipidemia   . RBBB   . History of nuclear stress test 2000    persantine; low risk scan   . TIA (transient ischemic attack)   . Gait disturbance   . Lumbago   . Memory loss    Past Surgical History  Procedure Laterality Date  . Back surgery      lower  . Vericose veins    . Abdominal hysterectomy     . Breast surgery    . Wrist surgery    . Cardiac catheterization  02/07/2010    mild CAD  . Fracture surgery  01/2011    left arm   . Mastectomy      Left breast with lymph node removeal  . Laparoscopic cholecystectomy  02/11/2010  . Transthoracic echocardiogram  04/20/2011    EF 55-60%; mildly thickened AV leaflets;    Family History  Problem Relation Age of Onset  . Diabetes type II Son   . Emphysema Brother     smoked  . Thyroid disease Daughter   . Thyroid disease Son    Social History  Substance Use Topics  . Smoking status: Never Smoker   . Smokeless tobacco: Never Used  . Alcohol Use: No   Lives alone, family stay with her at night  OB History    No data available     Review of Systems  Musculoskeletal: Positive for back pain, arthralgias (right hip pain) and gait problem (at baseline).  All other systems reviewed and are negative.  Allergies  Codeine  Home Medications   Prior to Admission medications   Medication Sig Start Date End Date Taking? Authorizing Provider  acetaminophen (TYLENOL) 325 MG tablet Take  650 mg by mouth every 4 (four) hours as needed. pain    Historical Provider, MD  aspirin 81 MG tablet Take 81 mg by mouth daily.    Historical Provider, MD  b complex vitamins tablet Take 1 tablet by mouth daily.    Historical Provider, MD  calcium carbonate (OS-CAL) 600 MG TABS tablet Take 600 mg by mouth daily with breakfast.    Historical Provider, MD  Chlorpheniramine Maleate (ALLERGY PO) Take 10 mg by mouth daily.    Historical Provider, MD  diltiazem (CARDIZEM CD) 120 MG 24 hr capsule Take 120 mg by mouth daily.  01/29/11   Historical Provider, MD  donepezil (ARICEPT) 10 MG tablet TAKE 1 TABLET (10 MG TOTAL) BY MOUTH DAILY. 07/20/15   Ward Givens, NP  DULoxetine (CYMBALTA) 30 MG capsule Take 1 capsule by mouth daily. 11/23/13   Historical Provider, MD  FLUoxetine (PROZAC) 10 MG capsule Take 10 mg by mouth daily.    Historical Provider, MD   furosemide (LASIX) 20 MG tablet Take 1 tablet (20 mg total) by mouth daily as needed for edema. 05/31/15   Pixie Casino, MD  KLOR-CON M20 20 MEQ tablet Take 20 mEq by mouth daily.  01/02/11   Historical Provider, MD  magnesium oxide (MAG-OX) 400 MG tablet Take 400 mg by mouth daily.    Historical Provider, MD  memantine (NAMENDA) 10 MG tablet TAKE 1 TABLET (10 MG TOTAL) BY MOUTH 2 (TWO) TIMES DAILY. 06/16/15   Ward Givens, NP  Misc Natural Products (OSTEO BI-FLEX ADV DOUBLE ST PO) Take 1 capsule by mouth 2 (two) times daily.    Historical Provider, MD  oxybutynin (DITROPAN-XL) 10 MG 24 hr tablet Take 10 mg by mouth daily. 06/22/15   Historical Provider, MD  QUEtiapine (SEROQUEL) 25 MG tablet Take 12.5 mg by mouth at bedtime.    Historical Provider, MD  traMADol (ULTRAM) 50 MG tablet Take 1 tablet (50 mg total) by mouth every 6 (six) hours as needed for moderate pain or severe pain. 07/30/15   Rolland Porter, MD   Triage Vitals: BP 141/80 mmHg  Pulse 76  Resp 18  SpO2 97% Physical Exam  Constitutional: She appears well-developed and well-nourished.  Non-toxic appearance. She does not appear ill. No distress.  HENT:  Head: Normocephalic and atraumatic.  Right Ear: External ear normal.  Left Ear: External ear normal.  Nose: Nose normal. No mucosal edema or rhinorrhea.  Mouth/Throat: Oropharynx is clear and moist and mucous membranes are normal. No dental abscesses or uvula swelling.  Eyes: Conjunctivae and EOM are normal. Pupils are equal, round, and reactive to light.  Neck: Normal range of motion and full passive range of motion without pain. Neck supple.  Cardiovascular: Normal rate, regular rhythm and normal heart sounds.  Exam reveals no gallop and no friction rub.   No murmur heard. Pulmonary/Chest: Effort normal and breath sounds normal. No respiratory distress. She has no wheezes. She has no rhonchi. She has no rales. She exhibits no tenderness and no crepitus.  Abdominal: Soft. Normal  appearance and bowel sounds are normal. She exhibits no distension. There is no tenderness. There is no rebound and no guarding.  Musculoskeletal: Normal range of motion. She exhibits no edema or tenderness.       Back:       Legs: Moves all extremities well. Flexes right knee and right hip w/out difficulty. Tender to palpation over true right hip joint. Nontender over greater trochanter. Diffusely tender over the lower  lumbar spine  Neurological: She is alert. She has normal strength. No cranial nerve deficit.  Alert and cooperative; some confusion and memory loss.   Skin: Skin is warm, dry and intact. No rash noted. No erythema. No pallor.  Psychiatric: She has a normal mood and affect. Her speech is normal and behavior is normal. Her mood appears not anxious.  Nursing note and vitals reviewed.   ED Course  Procedures (including critical care time)  Medications  traMADol (ULTRAM) tablet 50 mg (not administered)    DIAGNOSTIC STUDIES: Oxygen Saturation is 97% on ra, nl by my interpretation.    COORDINATION OF CARE: 11:17 PM: Discussed treatment plan which includes imaging of her right  with pt and family at bedside; they verbalizes understanding and agrees with treatment plan.  After reviewing her x-ray I had the nursing staff tried [place] patient.  01:22 pt ambulated in her room with a walker (which she normally uses at home) and reports she had pain in her right hip with every step on the right. Discussed getting CT scan to look for occult fracture and CT of her lumbar spine.  Family now also reports patient has been getting physical therapy at home for the past 2 months due to leg weakness. She states the physical therapist yesterday thought her hip pain was from her back.  3:30 AM patient and family were given her CT results. She has seen Dr. Layne Benton in the past for injections in her knees, she has seen Dr. Luiz Ochoa for her back in the past. She will be referred back to them for  further evaluation of her pain. Daughter is concerned because patient has been unable to get out of bed today. She was started on tramadol for pain. I talked to the family that they would not admit her for this kind of problem. And if they thought they wanted her in a nursing home, which family states they do not want, she would not be admitted for this type of problem. Family may need to stay with her more and help her more.   Imaging Review Ct Lumbar Spine Wo Contrast  07/30/2015  CLINICAL DATA:  RIGHT hip pain for 1 day, unable to bear weight. Pain shoots to RIGHT hip and leg. EXAM: CT LUMBAR SPINE WITHOUT CONTRAST TECHNIQUE: Multidetector CT imaging of the lumbar spine was performed without intravenous contrast administration. Multiplanar CT image reconstructions were also generated. COMPARISON:  MRI of the lumbar spine December 22, 2010 FINDINGS: Vertebral bodies are intact with maintenance of lumbar lordosis. Using the reference level of the last well-formed intervertebral disc as L5-S1, similar minimal grade 1 L3-4 anterolisthesis without spondylolysis. Severe L4-5 disc height loss is mildly progressed. Mild L2-3 and L3-4 disc height loss. Severe osteopenia without destructive bony lesions. Status post cholecystectomy. Severe aortoiliac vascular calcifications. Status post hysterectomy. Partially imaged apparent severe sigmoid diverticulosis with stool distended rectum. Level by level evaluation: L1-2: No disc bulge, canal stenosis nor neural foraminal narrowing. L2-3: Small broad-based disc osteophyte complex asymmetric to the LEFT. Moderate facet arthropathy and ligamentum flavum redundancy. Mild canal stenosis. Mild bilateral caudal neural foraminal narrowing. L3-4: Small broad-based disc osteophyte complex. Severe RIGHT, moderate to severe LEFT facet arthropathy and ligamentum flavum redundancy. Mild canal stenosis. Moderate RIGHT neural foraminal narrowing. L4-5: Small broad-based disc osteophyte  complex. Severe RIGHT greater LEFT facet arthropathy status post LEFT hemilaminectomy. No canal stenosis. Moderate LEFT, mild RIGHT neural foraminal narrowing. L5-S1: No significant disc bulge. Severe facet arthropathy without canal stenosis.  Moderate to severe LEFT neural foraminal narrowing. IMPRESSION: No acute fracture or malalignment. Severe osteopenia decreases sensitivity for acute nondisplaced fractures. Similar degenerative change of the lumbar spine resulting in mild canal stenosis at L2-3 and L3-4. Neural foraminal narrowing L2-3 through L5-S1: Moderate to severe on the LEFT at L5-S1. Electronically Signed   By: Elon Alas M.D.   On: 07/30/2015 03:20   Ct Hip Right Wo Contrast  07/30/2015  CLINICAL DATA:  Acute onset of right hip pain for 1 day, radiating up into the leg. Initial encounter. EXAM: CT OF THE RIGHT HIP WITHOUT CONTRAST TECHNIQUE: Multidetector CT imaging of the right hip was performed according to the standard protocol. Multiplanar CT image reconstructions were also generated. COMPARISON:  Right hip radiographs performed 07/29/2015 FINDINGS: There is no evidence of fracture or dislocation. The left femoral neck is unremarkable in appearance. Mild degenerative change is noted at the right sacroiliac joint, and at the pubic symphysis. No hip joint effusion is seen. The visualized musculature is grossly unremarkable, aside from mild atrophy. Scattered calcification is noted along the right common femoral artery and its branches. Scattered diverticulosis is noted along the sigmoid colon. The rectum is mildly distended with stool. IMPRESSION: 1. No evidence of fracture or dislocation. 2. Scattered diverticulosis along the sigmoid colon. 3. Rectum mildly distended with stool. 4. Scattered calcification along the right common femoral artery and its branches. Electronically Signed   By: Garald Balding M.D.   On: 07/30/2015 03:23   Dg Hip Unilat With Pelvis 2-3 Views Right  07/30/2015   CLINICAL DATA:  Acute onset of right hip pain for 2 days. Initial encounter. EXAM: DG HIP (WITH OR WITHOUT PELVIS) 2-3V RIGHT COMPARISON:  CT of the chest, abdomen and pelvis performed 01/03/2004 FINDINGS: There is no evidence of fracture or dislocation. Both femoral heads are seated normally within their respective acetabula. The proximal right femur appears intact. Degenerative change is noted along the lower lumbar spine. Mild degenerative change is noted at the pubic symphysis. The visualized bowel gas pattern is grossly unremarkable in appearance. IMPRESSION: No evidence of fracture or dislocation. Electronically Signed   By: Garald Balding M.D.   On: 07/30/2015 00:02   I have personally reviewed and evaluated these images as part of my medical decision-making.  MDM   Final diagnoses:  Right hip pain  Low back pain at multiple sites    New Prescriptions   TRAMADOL (ULTRAM) 50 MG TABLET    Take 1 tablet (50 mg total) by mouth every 6 (six) hours as needed for moderate pain or severe pain.    Plan discharge  Rolland Porter, MD, FACEP   I personally performed the services described in this documentation, which was scribed in my presence. The recorded information has been reviewed and considered.  Rolland Porter, MD, Barbette Or, MD 07/30/15 [phone]

## 2015-07-29 NOTE — ED Notes (Signed)
Pt from home by ems for c/o right hip pain.  Pt denies injury or trauma or recent fall

## 2015-07-29 NOTE — ED Notes (Signed)
CBG 127 

## 2015-07-30 ENCOUNTER — Emergency Department (HOSPITAL_COMMUNITY): Payer: Medicare Other

## 2015-07-30 DIAGNOSIS — M25551 Pain in right hip: Secondary | ICD-10-CM | POA: Diagnosis not present

## 2015-07-30 MED ORDER — TRAMADOL HCL 50 MG PO TABS: 50.0000 mg | ORAL_TABLET | Freq: Four times a day (QID) | ORAL | Status: DC | PRN

## 2015-07-30 MED ORDER — TRAMADOL HCL 50 MG PO TABS
50.0000 mg | ORAL_TABLET | Freq: Once | ORAL | Status: AC
  Administered 2015-07-30: 50 mg via ORAL
  Filled 2015-07-30: qty 1

## 2015-07-30 NOTE — ED Notes (Signed)
Pt ambulated in room, pt complain of pain and shaking in leg

## 2015-07-30 NOTE — ED Notes (Addendum)
Pt resting with eyes open, appears to be in no distress. Respirations are even and unlabored.  

## 2015-07-30 NOTE — Discharge Instructions (Signed)
Give her the tramadol with acetaminophen 650 mg 4 times a day for pain. You can have her seen by Dr Layne Benton to see if an injection of her hip might help. She has a lot of spine disease but it is mainly on the LEFT.

## 2015-08-01 LAB — CBG MONITORING, ED: GLUCOSE-CAPILLARY: 127 mg/dL — AB (ref 65–99)

## 2015-10-03 ENCOUNTER — Other Ambulatory Visit: Payer: Self-pay | Admitting: Adult Health

## 2015-10-27 ENCOUNTER — Other Ambulatory Visit: Payer: Self-pay | Admitting: Adult Health

## 2015-12-01 ENCOUNTER — Ambulatory Visit: Payer: Medicare Other | Admitting: Internal Medicine

## 2015-12-28 ENCOUNTER — Other Ambulatory Visit: Payer: Self-pay | Admitting: Adult Health

## 2015-12-29 NOTE — Telephone Encounter (Addendum)
I called and spoke to Chrystie Nose, poa for pt.  Pt has been given 1 tab at pm.  She was not sure if 50mg  or 25mg  tab that she is given.  She will check and I will call her in am and check.

## 2015-12-30 MED ORDER — QUETIAPINE FUMARATE 50 MG PO TABS: 50.0000 mg | ORAL_TABLET | Freq: Every day | ORAL | 6 refills | Status: DC

## 2015-12-30 NOTE — Telephone Encounter (Signed)
I called and spoke to [place], [place] of pt.  They have been giving seroquel 50mg  po qhs at bedtime.  From last ovf note it states has been taking.  (they have at times given 25mg  (1/2 tab).  She has gotten slightly worse so they have increased to 50mg .  Please advise.  I put in prescription for 50mg  1 tab po qhs.

## 2016-01-09 ENCOUNTER — Other Ambulatory Visit: Payer: Self-pay | Admitting: Physician Assistant

## 2016-01-09 DIAGNOSIS — N6452 Nipple discharge: Secondary | ICD-10-CM

## 2016-01-09 DIAGNOSIS — N6459 Other signs and symptoms in breast: Secondary | ICD-10-CM

## 2016-01-16 ENCOUNTER — Ambulatory Visit (INDEPENDENT_AMBULATORY_CARE_PROVIDER_SITE_OTHER): Payer: Medicare Other | Admitting: Internal Medicine

## 2016-01-16 ENCOUNTER — Encounter: Payer: Self-pay | Admitting: Internal Medicine

## 2016-01-16 VITALS — BP 130/74 | HR 68 | Ht 61.0 in | Wt 160.0 lb

## 2016-01-16 DIAGNOSIS — I4510 Unspecified right bundle-branch block: Secondary | ICD-10-CM

## 2016-01-16 DIAGNOSIS — I10 Essential (primary) hypertension: Secondary | ICD-10-CM | POA: Diagnosis not present

## 2016-01-16 DIAGNOSIS — I517 Cardiomegaly: Secondary | ICD-10-CM | POA: Diagnosis not present

## 2016-01-16 DIAGNOSIS — I480 Paroxysmal atrial fibrillation: Secondary | ICD-10-CM | POA: Diagnosis not present

## 2016-01-16 NOTE — Progress Notes (Signed)
Patient ID: Sara [name], female   DOB: Jul 29, 1925, 82 y.o.   MRN: [id number]    OFFICE NOTE  Chief Complaint:  Arthritic pain  Primary Care Physician: Aura Dials, PA-C  HPI:  Sara [name] is an 82 year old female who has had a history of falls due to imbalance and a history of dementia with some worsening memory loss. She had a history of paroxysmal atrial fibrillation which was very short lived and converted, possibly thought to be in the setting of acute cholecystitis. She has been on aspirin alone due to her falls and is not felt to be a good Coumadin candidate. In the past, she had been on Lasix up to 40 mg daily. This was, however, decreased due to excessive urination and eventually stopped.   Today she presents for annual follow-up. Has had issues with back and knees. Has constant pain in these areas. Has discussed with PCP and has been treated with everything per the patient. They have recently tried a topical cream. States they do not want to try anything stronger. Fell 2 weeks ago and went to the ED. Had CT head and XR right knee.    Sara [name] has no complaints today. Overall she is feeling fairly well. The memory continues to decline somewhat. She's had no recent falls. In the past and taken off her statin and I do not see clear indications for her to continue to be on fish oil and niacin.   [name] returns today accompanied by her daughter. Recently she's had a fall and was seen in the [place] [facility]. She underwent a CT scan of her chest and head. The chest CT did not indicate any fractures although she was complaining of left chest pain. She was noted to have some aortic and coronary atherosclerosis as well as mild cardiomegaly. There were diffuse groundglass changes on the CT scan concerning for interstitial findings. Ms. Vonbergen reports some worsening shortness of breath recently. Her daughter notes that some of her pain complaints of been out of proportion and may be related  to her dementia. She is also on Seroquel, possibly for behavioral disturbance or sleep problems.  Sara [name] returns today for follow-up. In the interim she had an echocardiogram which shows some LVH but normal systolic function. There is mild diastolic dysfunction. Her CT indicated interstitial findings and she saw Dr. Melvyn Novas with pulmonary. He felt she has interstitial lung disease possibly related to long-term Macrodantin use. She discontinued that medicine is ready felt some improvement in her shortness of breath. She's had 2 BNP studies and although she appears to be euvolemic on exam her BNP is very mildly elevated, just slightly above 100. This could suggest some elevated filling pressures or an element of diastolic heart failure.  01/16/2016  Sara [name] is seen today in follow-up. She reports that she's had no worsening swelling. According to her daughter she's not required additional Lasix although I prescribed it for her. Weight is up about 8 pounds over appetite seems to improve somewhat. Her main complaints are related to significant arthritis which limits her activities. Memory is somewhat worse than it was before and she has a follow-up with her neurologist in October. She denies any chest pain or worsening shortness of breath.  PMHx:  Past Medical History:  Diagnosis Date  . Arthritis   . Cancer [facility])    breast - left  . Confusion   . DEMENTIA   . Diabetes mellitus    type 2  .  Dyslipidemia   . Frequent falls   . Gait disturbance   . Generalized headaches   . Hearing loss   . History of nuclear stress test 2000   persantine; low risk scan   . Hypertension   . Lumbago   . Memory loss   . Mental disorder   . PAF (paroxysmal atrial fibrillation) ([place])    remote history, thought to be r/t acute cholecystits  . RBBB   . Sore throat   . Stroke [facility]) 02-23-2011   recently diagnosed with mini-strokes  . TIA (transient ischemic attack)   . Weakness     Past Surgical  History:  Procedure Laterality Date  . ABDOMINAL HYSTERECTOMY    . BACK SURGERY     lower  . BREAST SURGERY    . CARDIAC CATHETERIZATION  02/07/2010   mild CAD  . FRACTURE SURGERY  01/2011   left arm   . LAPAROSCOPIC CHOLECYSTECTOMY  02/11/2010  . MASTECTOMY     Left breast with lymph node removeal  . TRANSTHORACIC ECHOCARDIOGRAM  04/20/2011   EF 55-60%; mildly thickened AV leaflets;   . vericose veins    . WRIST SURGERY      FAMHx:  Family History  Problem Relation Age of Onset  . Diabetes type II Son   . Emphysema Brother     smoked  . Thyroid disease Daughter   . Thyroid disease Son     SOCHx:   reports that she has never smoked. She has never used smokeless tobacco. She reports that she does not drink alcohol or use drugs.  ALLERGIES:  Allergies  Allergen Reactions  . Codeine Nausea Only and Other (See Comments)    In addition, most pain medications cause confusion, make patient light headed.    ROS: A comprehensive review of systems was negative  HOME MEDS: Current Outpatient Prescriptions  Medication Sig Dispense Refill  . acetaminophen (TYLENOL) 325 MG tablet Take 650 mg by mouth every 4 (four) hours as needed. pain    . aspirin 81 MG tablet Take 81 mg by mouth daily.    Marland Kitchen b complex vitamins tablet Take 1 tablet by mouth daily.    . calcium carbonate (OS-CAL) 600 MG TABS tablet Take 600 mg by mouth daily with breakfast.    . Chlorpheniramine Maleate (ALLERGY PO) Take 10 mg by mouth daily.    Marland Kitchen diltiazem (CARDIZEM CD) 120 MG 24 hr capsule Take 120 mg by mouth daily.     Marland Kitchen donepezil (ARICEPT) 10 MG tablet TAKE 1 TABLET (10 MG TOTAL) BY MOUTH DAILY. 30 tablet 11  . DULoxetine (CYMBALTA) 30 MG capsule Take 1 capsule by mouth daily.    Marland Kitchen FLUoxetine (PROZAC) 10 MG capsule Take 10 mg by mouth daily.    . furosemide (LASIX) 20 MG tablet Take 1 tablet (20 mg total) by mouth daily as needed for edema. 90 tablet 0  . KLOR-CON M20 20 MEQ tablet Take 20 mEq by mouth  daily.     . magnesium oxide (MAG-OX) 400 MG tablet Take 400 mg by mouth daily.    . memantine (NAMENDA) 10 MG tablet TAKE 1 TABLET (10 MG TOTAL) BY MOUTH 2 (TWO) TIMES DAILY. 60 tablet 3  . Misc Natural Products (OSTEO BI-FLEX ADV DOUBLE ST PO) Take 1 capsule by mouth 2 (two) times daily.    Marland Kitchen oxybutynin (DITROPAN-XL) 10 MG 24 hr tablet Take 10 mg by mouth daily.  11  . QUEtiapine (SEROQUEL) 50 MG tablet Take  1 tablet (50 mg total) by mouth at bedtime. 30 tablet 6   No current facility-administered medications for this visit.     LABS/IMAGING: No results found for this or any previous visit (from the past 48 hour(s)). No results found.  VITALS: BP 130/74   Pulse 68   Ht 5\' 1"  (1.549 m)   Wt 160 lb (72.6 kg)   BMI 30.23 kg/m   EXAM: General appearance: alert and no distress Lungs: diminished breath sounds bilaterally Heart: regular rate and rhythm Extremities: extremities normal, atraumatic, no cyanosis or edema Neurologic: Mental status: Alert, oriented, thought content appropriate Psych: Pleasant  EKG: Normal sinus rhythm at 68, RBBB  ASSESSMENT: 1. HTN 2. Hyperlipidemia- not actively treating due to weight loss 3. Bifascicular block 4. Paroxysmal a-fib, not on warfarin due to fall risk 5. Dementia 6. Interstitial lung disease 7. Diastolic CHF - LVH  PLAN: 1.   Mrs. Goldston has had no worsening shortness of breath although has had some weight gain. This is apparently due to better appetite and she has no pulmonary edema or lower extremity swelling. Recommend follow-up in 6 months and contact me if swelling worsens or there is a need for increased Lasix usage.  Pixie Casino, MD, [facility] Attending Cardiologist [place] 01/16/2016, 12:59 PM

## 2016-01-16 NOTE — Patient Instructions (Addendum)
Medication Instructions:  Your physician recommends that you continue on your current medications as directed. Please refer to the Current Medication list given to you today.  Labwork: None   Testing/Procedures: None   Follow-Up: Your physician wants you to follow-up in: 6 months with Dr Hilty. You will receive a reminder letter in the mail two months in advance. If you don't receive a letter, please call our office to schedule the follow-up appointment.  Any Other Special Instructions Will Be Listed Below (If Applicable).     If you need a refill on your cardiac medications before your next appointment, please call your pharmacy.  

## 2016-01-19 ENCOUNTER — Ambulatory Visit (INDEPENDENT_AMBULATORY_CARE_PROVIDER_SITE_OTHER): Payer: Medicare Other | Admitting: Neurology

## 2016-01-19 ENCOUNTER — Encounter: Payer: Self-pay | Admitting: Neurology

## 2016-01-19 DIAGNOSIS — F03918 Unspecified dementia, unspecified severity, with other behavioral disturbance: Secondary | ICD-10-CM | POA: Insufficient documentation

## 2016-01-19 DIAGNOSIS — F0391 Unspecified dementia with behavioral disturbance: Secondary | ICD-10-CM

## 2016-01-19 MED ORDER — DONEPEZIL HCL 10 MG PO TABS: ORAL_TABLET | ORAL | 4 refills | Status: DC

## 2016-01-19 MED ORDER — QUETIAPINE FUMARATE 50 MG PO TABS: 50.0000 mg | ORAL_TABLET | Freq: Every day | ORAL | 11 refills | Status: DC

## 2016-01-19 MED ORDER — MEMANTINE HCL 10 MG PO TABS: 10.0000 mg | ORAL_TABLET | Freq: Two times a day (BID) | ORAL | 4 refills | Status: DC

## 2016-01-19 NOTE — Progress Notes (Signed)
PATIENT: Sara [name] DOB: 07-13-1925  REASON FOR VISIT: follow up- memory loss HISTORY FROM: patient  HISTORY OF PRESENT ILLNESS:  Sara [name] is a 82 year old right-handed Caucasian female, follow-up for dementia, she was initially evaluated in 2014,  She has past medical history of hypertension, diabetes, CAT scan of the brain without contrast has demonstrated atrophy, modereate periventricular small vessel disease,  She had 12 years of education, worked at [employer], retired in 1989, has been active, widowed 12 years ago, still driving, daughter noticed she has gradual onset of memory trouble since 2012, also because of her chronic low back pain knee pain, has become sedentary, she fell a month ago, with left wrist fracture, status post surgical fixation, she had increased forgetfulness, occasionally confusion,  She still able to drive short distance, daughter helps her with bills now, she tends to make mistakes, she can still use her phone live independently,  there is no family history of dementia or, she has 3 brothers and 3 sisters, Her mother died of congestive heart failure at age 46, her father died of gastric ulcer at age 77, besides this TIA episode, there was no history of stroke like symptoms, there was no sudden stepwise decline of her mentation.  She was taken to [facility] emergency room in April 18 2011, she was noticed to have sudden onset language difficulty, sounds like paraphasic errors, no loss of consciousness, no limb muscle weakness, lasting 5 minutes, she had extensive evaluation,  MRI of the brain in 2013 has demonstrate extensive white matter disease, MRA of the brain has demonstrate atherosclerotic disease, echocardiogram was normal, ejection fraction 55-60%, ultrasound of carotid artery no significant carotid artery stenosis,  She is now back to her baseline, daughter reported mild improvement with donepezil, she is less confused, she was also  givenseroquel 25 mg every night, which helps her sleep, otherwise she has difficulty falling sleep, sometimes hear people calling her name, baby crying   Update October 5th 2017 Over the past few years, she had gradual declined functional status, she still lives by herself, but has caregiver comes in 3 days a week 5 hours each day, her children take turns to stay with her overnight,  She is taking namenda and aricept, she was noted to have fluctuation in her mentation, also easily agitated, she has visual hallucinations, is taking seroquel now 50 mg every day, she has urinary incontinence,   REVIEW OF SYSTEMS: Out of a complete 14 system review of symptoms, the patient complains only of the following symptoms, and all other reviewed systems are negative.  Cold intolerance, bruising easily, speech difficulty, weakness, agitation, confusion, decreased concentration, joint pain, back pain, achy muscles, walking difficulty, hearing loss, runny nose   ALLERGIES: Allergies  Allergen Reactions  . Codeine Nausea Only and Other (See Comments)    In addition, most pain medications cause confusion, make patient light headed.    HOME MEDICATIONS: Outpatient Medications Prior to Visit  Medication Sig Dispense Refill  . acetaminophen (TYLENOL) 325 MG tablet Take 650 mg by mouth every 4 (four) hours as needed. pain    . aspirin 81 MG tablet Take 81 mg by mouth daily.    Marland Kitchen b complex vitamins tablet Take 1 tablet by mouth daily.    . calcium carbonate (OS-CAL) 600 MG TABS tablet Take 600 mg by mouth daily with breakfast.    . Chlorpheniramine Maleate (ALLERGY PO) Take 10 mg by mouth daily.    Marland Kitchen diltiazem (CARDIZEM  CD) 120 MG 24 hr capsule Take 120 mg by mouth daily.     Marland Kitchen donepezil (ARICEPT) 10 MG tablet TAKE 1 TABLET (10 MG TOTAL) BY MOUTH DAILY. 30 tablet 11  . DULoxetine (CYMBALTA) 30 MG capsule Take 1 capsule by mouth daily.    Marland Kitchen FLUoxetine (PROZAC) 10 MG capsule Take 10 mg by mouth daily.    .  furosemide (LASIX) 20 MG tablet Take 1 tablet (20 mg total) by mouth daily as needed for edema. 90 tablet 0  . KLOR-CON M20 20 MEQ tablet Take 20 mEq by mouth daily.     . magnesium oxide (MAG-OX) 400 MG tablet Take 400 mg by mouth daily.    . memantine (NAMENDA) 10 MG tablet TAKE 1 TABLET (10 MG TOTAL) BY MOUTH 2 (TWO) TIMES DAILY. 60 tablet 3  . Misc Natural Products (OSTEO BI-FLEX ADV DOUBLE ST PO) Take 1 capsule by mouth 2 (two) times daily.    Marland Kitchen oxybutynin (DITROPAN-XL) 10 MG 24 hr tablet Take 10 mg by mouth daily.  11  . QUEtiapine (SEROQUEL) 50 MG tablet Take 1 tablet (50 mg total) by mouth at bedtime. 30 tablet 6   No facility-administered medications prior to visit.     PAST MEDICAL HISTORY: Past Medical History:  Diagnosis Date  . Arthritis   . Cancer [facility])    breast - left  . Confusion   . DEMENTIA   . Diabetes mellitus    type 2  . Dyslipidemia   . Frequent falls   . Gait disturbance   . Generalized headaches   . Hearing loss   . History of nuclear stress test 2000   persantine; low risk scan   . Hypertension   . Lumbago   . Memory loss   . Mental disorder   . PAF (paroxysmal atrial fibrillation) ([place])    remote history, thought to be r/t acute cholecystits  . RBBB   . Sore throat   . Stroke [facility]) 02-23-2011   recently diagnosed with mini-strokes  . TIA (transient ischemic attack)   . Weakness     PAST SURGICAL HISTORY: Past Surgical History:  Procedure Laterality Date  . ABDOMINAL HYSTERECTOMY    . BACK SURGERY     lower  . BREAST SURGERY    . CARDIAC CATHETERIZATION  02/07/2010   mild CAD  . FRACTURE SURGERY  01/2011   left arm   . LAPAROSCOPIC CHOLECYSTECTOMY  02/11/2010  . MASTECTOMY     Left breast with lymph node removeal  . TRANSTHORACIC ECHOCARDIOGRAM  04/20/2011   EF 55-60%; mildly thickened AV leaflets;   . vericose veins    . WRIST SURGERY      FAMILY HISTORY: Family History  Problem Relation Age of Onset  . Diabetes type II Son     . Emphysema Brother     smoked  . Thyroid disease Daughter   . Thyroid disease Son     SOCIAL HISTORY: Social History   Social History  . Marital status: Widowed    Spouse name: N/A  . Number of children: 3  . Years of education: 12   Occupational History  . Retired     retired.   Social History Main Topics  . Smoking status: Never Smoker  . Smokeless tobacco: Never Used  . Alcohol use No  . Drug use: No  . Sexual activity: Not on file   Other Topics Concern  . Not on file   Social History Narrative  Patient is retired and lives at home alone. Patient has a high school education. Patient has three children.   Caffeine-      PHYSICAL EXAM  Vitals:   01/19/16 1136  BP: 134/61  Pulse: 70  Weight: 161 lb 4 oz (73.1 kg)  Height: 5\' 1"  (1.549 m)   Body mass index is 30.47 kg/m.   MMSE - Mini Mental State Exam 01/19/2016 07/20/2015 01/19/2015  Orientation to time 0 1 1  Orientation to Place 4 3 3   Registration 3 3 3   Attention/ Calculation 1 5 5   Recall 0 1 1  Language- name 2 objects 2 2 2   Language- repeat 1 1 1   Language- follow 3 step command 2 3 2   Language- read & follow direction 1 1 1   Write a sentence 1 1 1   Copy design 0 0 0  Total score 15 21 20      PHYSICAL EXAMNIATION:  Gen: NAD, conversant, well nourised, obese, well groomed                     Cardiovascular: Regular rate rhythm, no peripheral edema, warm, nontender. Eyes: Conjunctivae clear without exudates or hemorrhage Neck: Supple, no carotid bruise. Pulmonary: Clear to auscultation bilaterally   NEUROLOGICAL EXAM:  MENTAL STATUS: Speech:    Speech is normal; fluent and spontaneous with normal comprehension.  Cognition: MMSE 15/30 animal naming 7     Orientation:She is not oriented to time, year, month, season, place         recent and remote memory: She missed 3 out of 3 recalls     Attention span and concentration: She has difficulties they will world backwards     Normal  Language, naming, repeating,spontaneous speech, she could not copy design,     Fund of knowledge   CRANIAL NERVES: CN II: Visual fields are full to confrontation. Fundoscopic exam is normal with sharp discs and no vascular changes. Pupils are round equal and briskly reactive to light. CN III, IV, VI: extraocular movement are normal. No ptosis. CN V: Facial sensation is intact to pinprick in all 3 divisions bilaterally. Corneal responses are intact.  CN VII: Face is symmetric with normal eye closure and smile. CN VIII: Hearing is normal to rubbing fingers CN IX, X: Palate elevates symmetrically. Phonation is normal. CN XI: Head turning and shoulder shrug are intact CN XII: Tongue is midline with normal movements and no atrophy.  MOTOR: There is no pronator drift of out-stretched arms. Muscle bulk and tone are normal. Muscle strength is normal.  REFLEXES: Reflexes are 2+ and symmetric at the biceps, triceps, knees, and ankles. Plantar responses are flexor.  SENSORY: Intact to light touch, pinprick, positional and vibratory sensation are intact in fingers and toes.  COORDINATION: Rapid alternating movements and fine finger movements are intact. There is no dysmetria on finger-to-nose and heel-knee-shin.    GAIT/STANCE: She needs push up to get up from seated position, cautious, rely on her walker   DIAGNOSTIC DATA (LABS, IMAGING, TESTING) - I reviewed patient records, labs, notes, testing and imaging myself where available.  Lab Results  Component Value Date   WBC 10.1 05/23/2015   HGB 14.0 05/23/2015   HCT 42.2 05/23/2015   MCV 89.3 05/23/2015   PLT 250.0 05/23/2015      Component Value Date/Time   NA 141 05/17/2015 1314   K 4.3 05/17/2015 1314   CL 104 05/17/2015 1314   CO2 31 05/17/2015 1314   GLUCOSE  108 (H) 05/17/2015 1314   BUN 14 05/17/2015 1314   CREATININE 0.71 05/17/2015 1314   CALCIUM 9.5 05/17/2015 1314   PROT 6.1 04/19/2011 0600   ALBUMIN 3.0 (L)  04/19/2011 0600   AST 14 04/19/2011 0600   ALT 10 04/19/2011 0600   ALKPHOS 68 04/19/2011 0600   BILITOT 0.4 04/19/2011 0600   GFRNONAA 82 (L) 04/21/2011 0500   GFRAA >90 04/21/2011 0500     Lab Results  Component Value Date   TSH 0.82 06/06/2015      ASSESSMENT AND PLAN 82 y.o. year old female   Moderate dementia with behavior issues, progressive worsening Continue Aricept, Namenda, refill her prescription Seroquel 50 mg every night  Return to clinic 6 months with nurse practitioner  Marcial Pacas, M.D. Ph.D.  [facility] [place], [place] [address] Phone: [phone] Fax:      [phone]

## 2016-01-23 ENCOUNTER — Other Ambulatory Visit: Payer: Medicare Other

## 2016-01-26 ENCOUNTER — Other Ambulatory Visit: Payer: Medicare Other

## 2016-02-02 ENCOUNTER — Other Ambulatory Visit: Payer: Self-pay | Admitting: *Deleted

## 2016-02-02 MED ORDER — QUETIAPINE FUMARATE 50 MG PO TABS: 50.0000 mg | ORAL_TABLET | Freq: Every day | ORAL | 3 refills | Status: DC

## 2016-02-07 ENCOUNTER — Ambulatory Visit
Admission: RE | Admit: 2016-02-07 | Discharge: 2016-02-07 | Disposition: A | Discharge status: Home / Self Care | Payer: Medicare Other | Source: Ambulatory Visit | Attending: Physician Assistant | Admitting: Physician Assistant

## 2016-02-07 DIAGNOSIS — N6452 Nipple discharge: Secondary | ICD-10-CM

## 2016-02-07 DIAGNOSIS — N6459 Other signs and symptoms in breast: Secondary | ICD-10-CM

## 2016-06-03 IMAGING — DX DG CHEST 2V
2 series · 2 of 2 positions shown · non-contrast
Comparison: 04/19/2011 ; correlation PET-CT 11/09/2009

CLINICAL DATA: Dyspnea on exertion, cough and congestion for 1
month, history breast cancer, hypertension, diabetes mellitus

EXAM:
CHEST  2 VIEW

[chest pa]
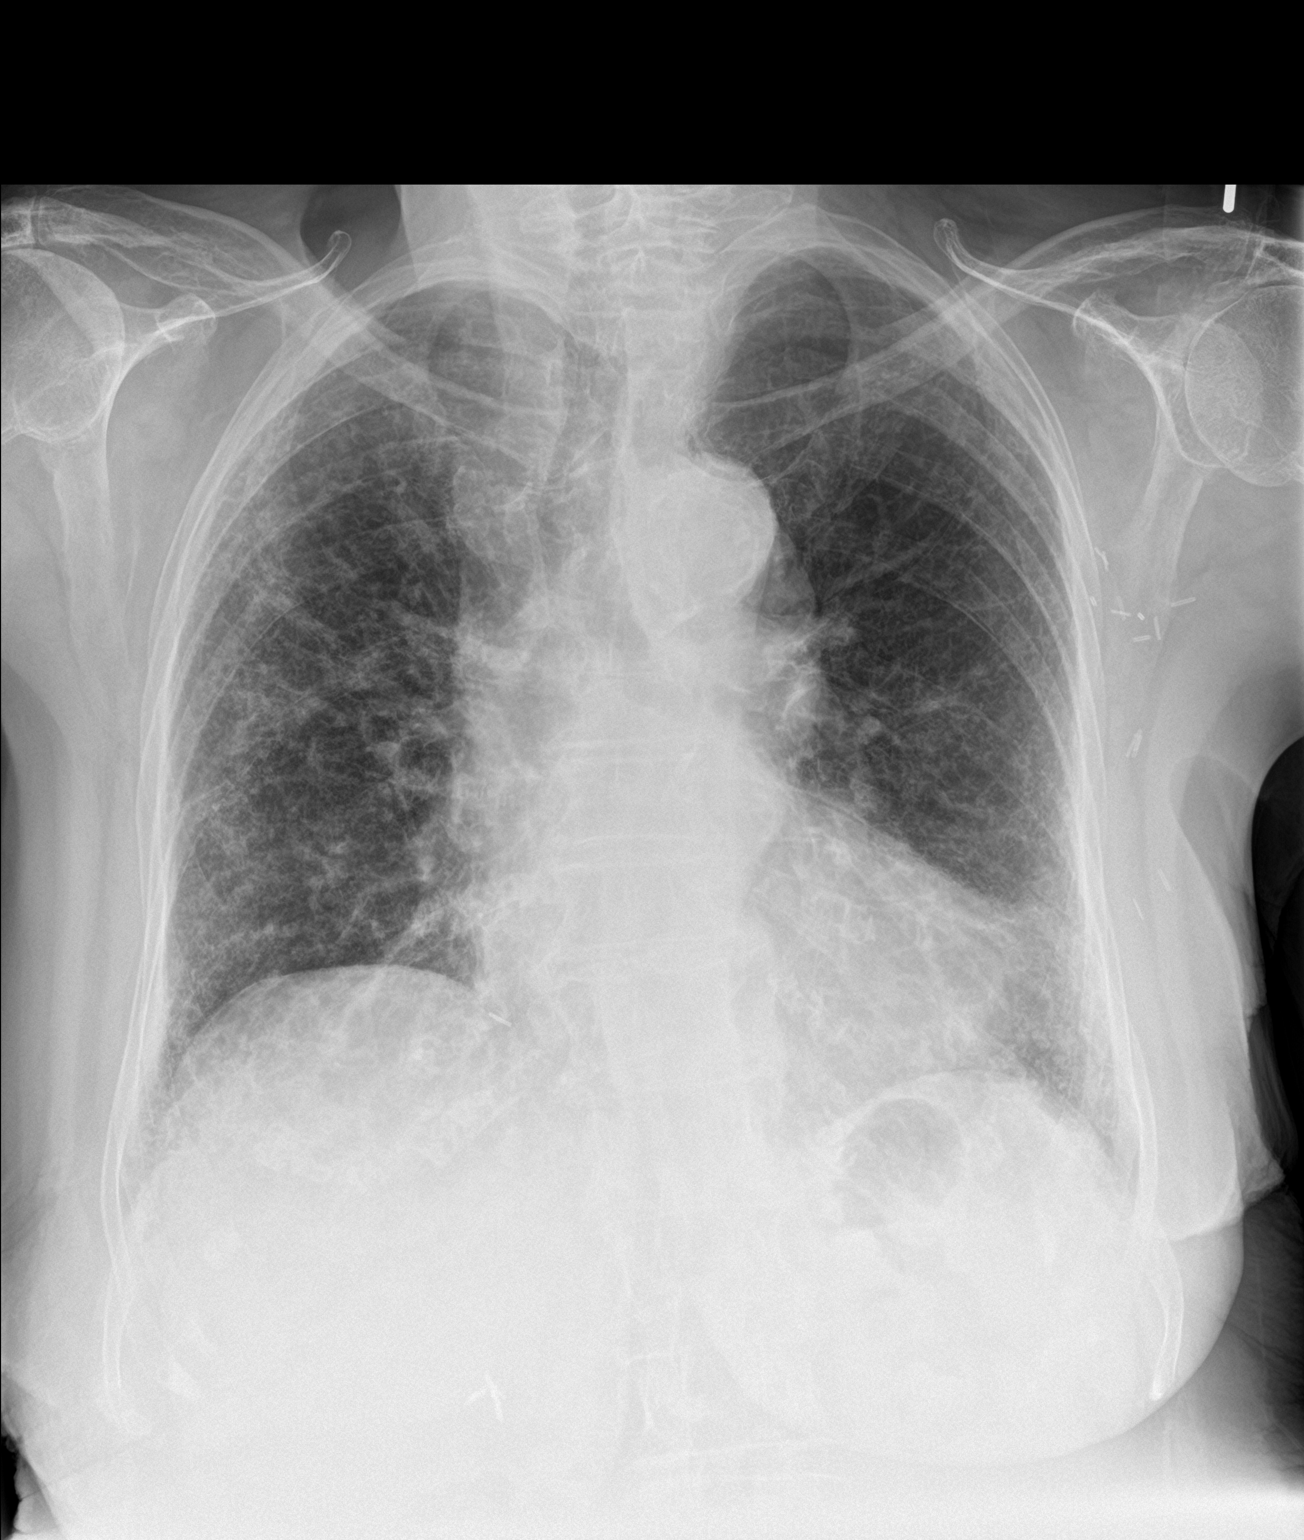

[chest lat]
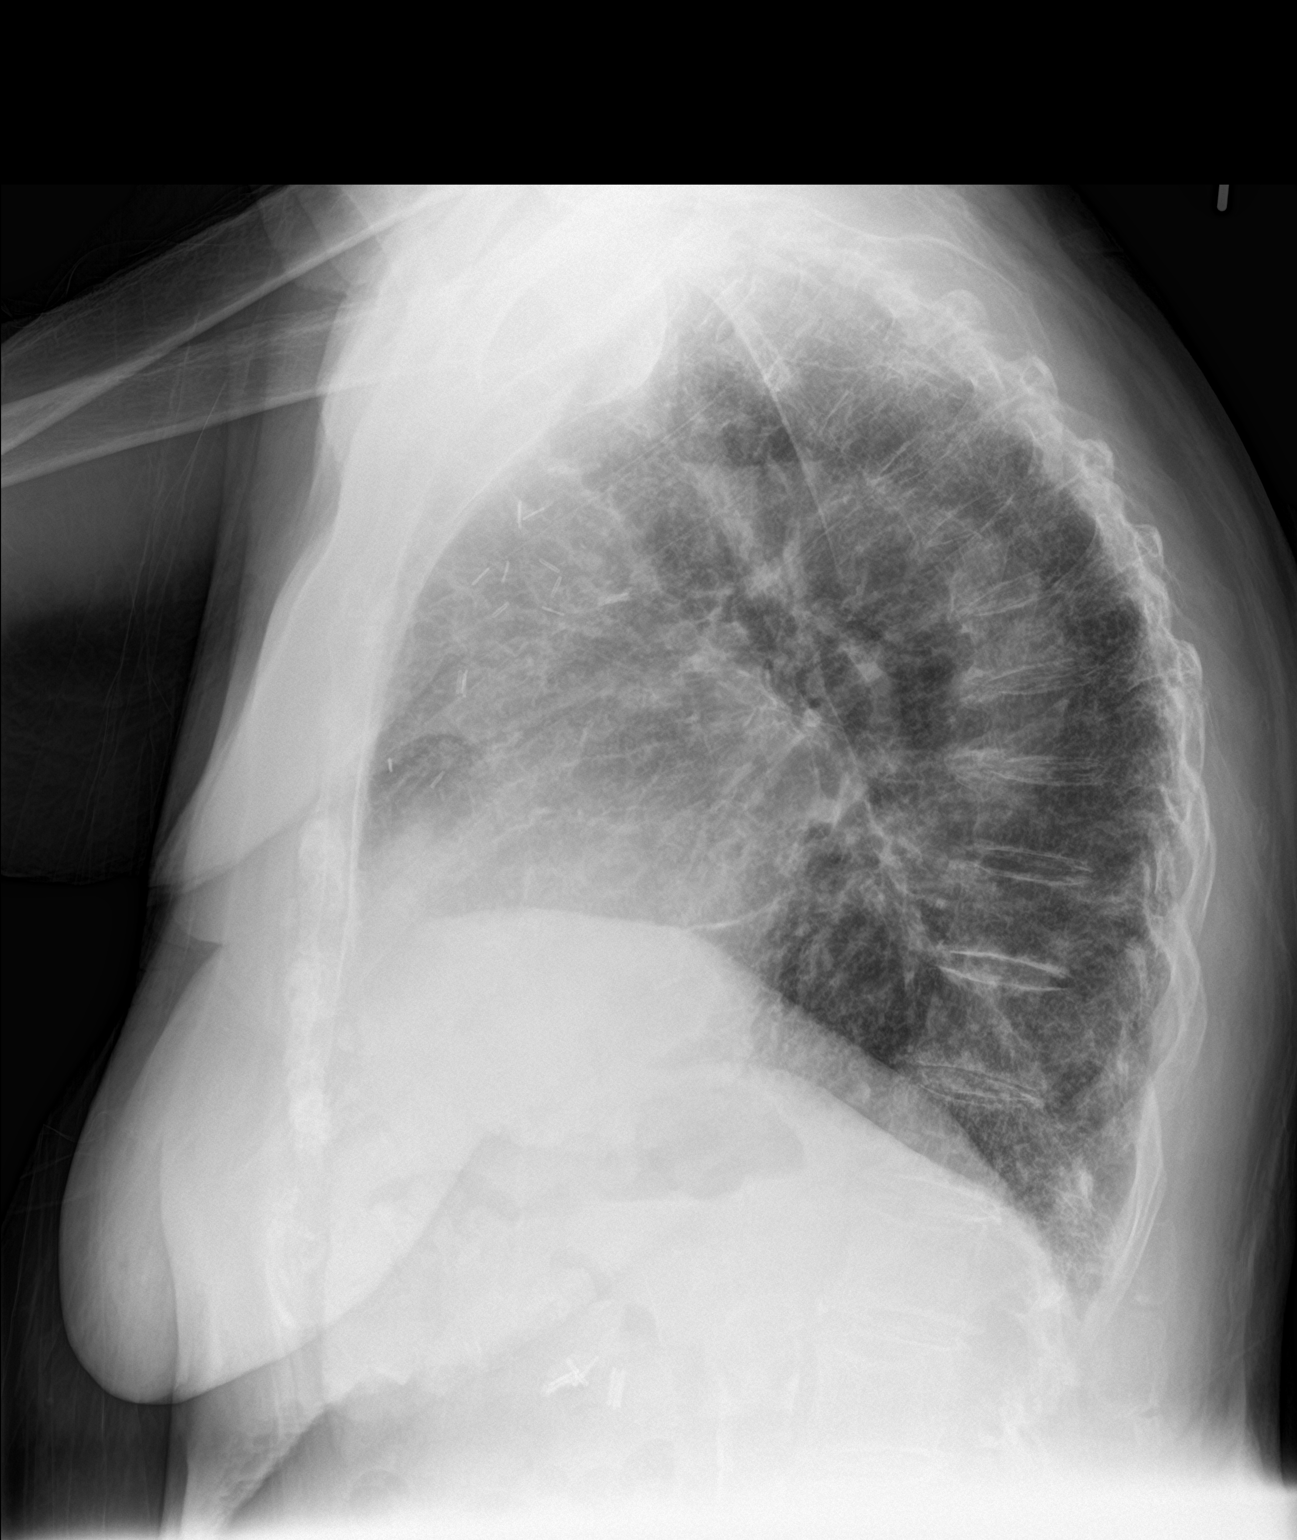

[2 of 2 positions shown; findings below may reference images not displayed]

FINDINGS: Enlargement of cardiac silhouette.

Calcified tortuous aorta.

Mediastinal contours and pulmonary vascularity normal.

Peripheral interstitial infiltrates throughout both lungs.

These appear progressive since previous exam and could represent
either acute interstitial infiltrates or developing peripheral
interstitial lung disease.

Prior PET-CT exam demonstrates mild peripheral interstitial lung
disease changes at that time, favor progression of chronic
interstitial disease.

No pleural effusion or pneumothorax.

Bones diffusely demineralized.

Surgical clips LEFT axilla and RIGHT upper quadrant.
IMPRESSION: Enlargement of cardiac silhouette.

Bronchitic changes with progression of peripheral interstitial
infiltrates throughout both lungs since 0200, favor progression of
chronic interstitial lung disease/fibrosis over acute peripheral
infiltrates.

## 2016-07-19 ENCOUNTER — Ambulatory Visit: Payer: Medicare Other | Admitting: Adult Health

## 2016-08-14 ENCOUNTER — Ambulatory Visit: Payer: Medicare Other | Admitting: Internal Medicine

## 2016-09-06 ENCOUNTER — Encounter: Payer: Self-pay | Admitting: Adult Health

## 2016-09-06 ENCOUNTER — Encounter (INDEPENDENT_AMBULATORY_CARE_PROVIDER_SITE_OTHER): Payer: Self-pay

## 2016-09-06 ENCOUNTER — Ambulatory Visit (INDEPENDENT_AMBULATORY_CARE_PROVIDER_SITE_OTHER): Payer: Medicare Other | Admitting: Adult Health

## 2016-09-06 VITALS — BP 143/73 | HR 69 | Ht 61.0 in | Wt 153.0 lb

## 2016-09-06 DIAGNOSIS — G309 Alzheimer's disease, unspecified: Secondary | ICD-10-CM | POA: Diagnosis not present

## 2016-09-06 DIAGNOSIS — F0281 Dementia in other diseases classified elsewhere with behavioral disturbance: Secondary | ICD-10-CM

## 2016-09-06 MED ORDER — MEMANTINE HCL 10 MG PO TABS: 10.0000 mg | ORAL_TABLET | Freq: Two times a day (BID) | ORAL | 4 refills | Status: AC

## 2016-09-06 MED ORDER — QUETIAPINE FUMARATE 50 MG PO TABS: 50.0000 mg | ORAL_TABLET | Freq: Every day | ORAL | 3 refills | Status: AC

## 2016-09-06 MED ORDER — DONEPEZIL HCL 10 MG PO TABS: ORAL_TABLET | ORAL | 4 refills | Status: AC

## 2016-09-06 NOTE — Patient Instructions (Signed)
Continue Namenda and Aricept  Continue Seroquel  Memory score slightly declined If your symptoms worsen or you develop new symptoms please let us know.

## 2016-09-06 NOTE — Progress Notes (Signed)
I have reviewed and agreed above plan. 

## 2016-09-06 NOTE — Progress Notes (Signed)
PATIENT: Sara [name] DOB: 24-Apr-1925  REASON FOR VISIT: follow up- Dementia HISTORY FROM: patient  HISTORY OF PRESENT ILLNESS:  HISTORY 01/19/16 Copied from Dr. Rhea Belton notes: Sara [name] is a 83 year old right-handed Caucasian female, follow-up for dementia, she was initially evaluated in 2014,  She has past medical history of hypertension, diabetes, CAT scan of the brain without contrast has demonstrated atrophy, modereate periventricular small vessel disease,  She had 12 years of education, worked at [employer], retired in 1989, has been active, widowed 12 years ago, still driving, daughter noticed she has gradual onset of memory trouble since 2012, also because of her chronic low back pain knee pain, has become sedentary, she fell a month ago, with left wrist fracture, status post surgical fixation, she had increased forgetfulness, occasionally confusion,  She still able to drive short distance, daughter helps her with bills now, she tends to make mistakes, she can still use her phone live independently,  there is no family history of dementia or, she has 3 brothers and 3 sisters, Her mother died of congestive heart failure at age 22, her father died of gastric ulcer at age 23, besides this TIA episode, there was no history of stroke like symptoms, there was no sudden stepwise decline of her mentation.  She was taken to [facility] emergency room in April 18 2011, she was noticed to have sudden onset language difficulty, sounds like paraphasic errors, no loss of consciousness, no limb muscle weakness, lasting 5 minutes, she had extensive evaluation,  MRI of the brain in 2013 has demonstrate extensive white matter disease, MRA of the brain has demonstrate atherosclerotic disease, echocardiogram was normal, ejection fraction 55-60%, ultrasound of carotid artery no significant carotid artery stenosis,  She is now back to her baseline, daughter reported mild improvement with  donepezil, she is less confused, she was also givenseroquel 25 mg every night, which helps her sleep, otherwise she has difficulty falling sleep, sometimes hear people calling her name, baby crying   Update October 5th 2017 Over the past few years, she had gradual declined functional status, she still lives by herself, but has caregiver comes in 3 days a week 5 hours each day, her children take turns to stay with her overnight,  She is taking namenda and aricept, she was noted to have fluctuation in her mentation, also easily agitated, she has visual hallucinations, is taking seroquel now 50 mg every day, she has urinary incontinence  TODAY 09/06/16:  Sara [name] is an 83 year old female with a history of Alzheimer's disease. She returns today for follow-up. She is currently taking Aricept and Namenda and is tolerating them well. She lives in her own home however she has someone with her most of the day and at night. Her daughter states that she may have 3-4 hours that she is there by herself. She requires assistance with most ADLs. Her daughter handles her finances. Her family also prepares all of her meals. She does not operate a motor vehicle. Denies any trouble sleeping. The daughter does note some hallucinations but denies them being violent or fearful. They did feel that Seroquel 50 mg at bedtime has been beneficial for the patient's mood and behavior. They deny any new neurological symptoms. Return today for an evaluation.  REVIEW OF SYSTEMS: Out of a complete 14 system review of symptoms, the patient complains only of the following symptoms, and all other reviewed systems are negative.  Joint pain, back pain, aching muscles, walking difficulty, wounds, sleep  talking, acting out dreams, hearing loss, chills, cold intolerance, bruise like bleed easily, memory loss, speech difficulty, weakness, agitation, confusion, decreased concentration, nervous/anxious  ALLERGIES: Allergies  Allergen  Reactions  . Codeine Nausea Only and Other (See Comments)    In addition, most pain medications cause confusion, make patient light headed.    HOME MEDICATIONS: Outpatient Medications Prior to Visit  Medication Sig Dispense Refill  . acetaminophen (TYLENOL) 325 MG tablet Take 650 mg by mouth every 4 (four) hours as needed. pain    . aspirin 81 MG tablet Take 81 mg by mouth daily.    Marland Kitchen b complex vitamins tablet Take 1 tablet by mouth daily.    . calcium carbonate (OS-CAL) 600 MG TABS tablet Take 600 mg by mouth daily with breakfast.    . Chlorpheniramine Maleate (ALLERGY PO) Take 10 mg by mouth daily.    Marland Kitchen diltiazem (CARDIZEM CD) 120 MG 24 hr capsule Take 120 mg by mouth daily.     Marland Kitchen donepezil (ARICEPT) 10 MG tablet TAKE 1 TABLET (10 MG TOTAL) BY MOUTH DAILY. 90 tablet 4  . DULoxetine (CYMBALTA) 30 MG capsule Take 1 capsule by mouth daily.    Marland Kitchen FLUoxetine (PROZAC) 10 MG capsule Take 10 mg by mouth daily.    . furosemide (LASIX) 20 MG tablet Take 1 tablet (20 mg total) by mouth daily as needed for edema. 90 tablet 0  . KLOR-CON M20 20 MEQ tablet Take 20 mEq by mouth daily.     . magnesium oxide (MAG-OX) 400 MG tablet Take 400 mg by mouth daily.    . memantine (NAMENDA) 10 MG tablet Take 1 tablet (10 mg total) by mouth 2 (two) times daily. 180 tablet 4  . Misc Natural Products (OSTEO BI-FLEX ADV DOUBLE ST PO) Take 1 capsule by mouth 2 (two) times daily.    Marland Kitchen oxybutynin (DITROPAN-XL) 10 MG 24 hr tablet Take 10 mg by mouth daily.  11  . QUEtiapine (SEROQUEL) 50 MG tablet Take 1 tablet (50 mg total) by mouth at bedtime. 90 tablet 3   No facility-administered medications prior to visit.     PAST MEDICAL HISTORY: Past Medical History:  Diagnosis Date  . Arthritis   . Cancer [facility])    breast - left  . Confusion   . DEMENTIA   . Diabetes mellitus    type 2  . Dyslipidemia   . Frequent falls   . Gait disturbance   . Generalized headaches   . Hearing loss   . History of nuclear stress  test 2000   persantine; low risk scan   . Hypertension   . Lumbago   . Memory loss   . Mental disorder   . PAF (paroxysmal atrial fibrillation) ([place])    remote history, thought to be r/t acute cholecystits  . RBBB   . Sore throat   . Stroke [facility]) 02-23-2011   recently diagnosed with mini-strokes  . TIA (transient ischemic attack)   . Weakness     PAST SURGICAL HISTORY: Past Surgical History:  Procedure Laterality Date  . ABDOMINAL HYSTERECTOMY    . BACK SURGERY     lower  . BREAST SURGERY    . CARDIAC CATHETERIZATION  02/07/2010   mild CAD  . FRACTURE SURGERY  01/2011   left arm   . LAPAROSCOPIC CHOLECYSTECTOMY  02/11/2010  . MASTECTOMY     Left breast with lymph node removeal  . TRANSTHORACIC ECHOCARDIOGRAM  04/20/2011   EF 55-60%; mildly thickened AV  leaflets;   . vericose veins    . WRIST SURGERY      FAMILY HISTORY: Family History  Problem Relation Age of Onset  . Diabetes type II Son   . Emphysema Brother        smoked  . Thyroid disease Daughter   . Thyroid disease Son     SOCIAL HISTORY: Social History   Social History  . Marital status: Widowed    Spouse name: N/A  . Number of children: 3  . Years of education: 12   Occupational History  . Retired     retired.   Social History Main Topics  . Smoking status: Never Smoker  . Smokeless tobacco: Never Used  . Alcohol use No  . Drug use: No  . Sexual activity: Not on file   Other Topics Concern  . Not on file   Social History Narrative   Patient is retired and lives at home alone. Patient has a high school education. Patient has three children.   Caffeine-      PHYSICAL EXAM  Vitals:   09/06/16 1238  Weight: 153 lb (69.4 kg)  Height: 5\' 1"  (1.549 m)   Body mass index is 28.91 kg/m.   MMSE - Mini Mental State Exam 09/06/2016 01/19/2016 07/20/2015  Orientation to time 0 0 1  Orientation to Place 3 4 3   Registration 3 3 3   Attention/ Calculation 0 1 5  Recall 0 0 1  Language- name 2  objects 1 2 2   Language- repeat 1 1 1   Language- follow 3 step command 3 2 3   Language- read & follow direction 1 1 1   Write a sentence 0 1 1  Copy design 0 0 0  Total score 12 15 21      Generalized: Well developed, in no acute distress   Neurological examination  Mentation: Alert . Follows all commands speech and language fluent Cranial nerve II-XII: Pupils were equal round reactive to light. Extraocular movements were full, visual field were full on confrontational test. Facial sensation and strength were normal. Uvula tongue midline. Head turning and shoulder shrug  were normal and symmetric. Motor: The motor testing reveals 5 over 5 strength of all 4 extremities. Good symmetric motor tone is noted throughout.  Sensory: Sensory testing is intact to soft touch on all 4 extremities. No evidence of extinction is noted.  Coordination: Cerebellar testing reveals good finger-nose-finger and heel-to-shin bilaterally.  Gait and station: Patient's gait is slightly unstable. She uses a Rollator when ambulating. Tandem gait not attempted. Reflexes: Deep tendon reflexes are symmetric and normal bilaterally.   DIAGNOSTIC DATA (LABS, IMAGING, TESTING) - I reviewed patient records, labs, notes, testing and imaging myself where available.  Lab Results  Component Value Date   WBC 10.1 05/23/2015   HGB 14.0 05/23/2015   HCT 42.2 05/23/2015   MCV 89.3 05/23/2015   PLT 250.0 05/23/2015      Component Value Date/Time   NA 141 05/17/2015 1314   K 4.3 05/17/2015 1314   CL 104 05/17/2015 1314   CO2 31 05/17/2015 1314   GLUCOSE 108 (H) 05/17/2015 1314   BUN 14 05/17/2015 1314   CREATININE 0.71 05/17/2015 1314   CALCIUM 9.5 05/17/2015 1314   PROT 6.1 04/19/2011 0600   ALBUMIN 3.0 (L) 04/19/2011 0600   AST 14 04/19/2011 0600   ALT 10 04/19/2011 0600   ALKPHOS 68 04/19/2011 0600   BILITOT 0.4 04/19/2011 0600   GFRNONAA 82 (L) 04/21/2011 0500  GFRAA >90 04/21/2011 0500    ASSESSMENT AND  PLAN 83 y.o. year old female  has a past medical history of Arthritis; Cancer ([place]); Confusion; DEMENTIA; Diabetes mellitus; Dyslipidemia; Frequent falls; Gait disturbance; Generalized headaches; Hearing loss; History of nuclear stress test (2000); Hypertension; Lumbago; Memory loss; Mental disorder; PAF (paroxysmal atrial fibrillation) ([place]); RBBB; Sore throat; Stroke ([place]) (02-23-2011); TIA (transient ischemic attack); and Weakness. here with:  1. Dementia with behavioral disturbance  The patient's memory score has slightly declined. She will continue on Aricept and Namenda. The patient's behavior has improved on Seroquel. We will continue Seroquel 50 mg at bedtime as well. Patient and her daughter are advised that if her symptoms worsen or she develops new symptoms they should let us know. She will follow-up in 6 months or sooner if needed.  I spent 15 minutes with the patient. 50% of this time was spent discussing her memory score.    Ward Givens, MSN, NP-C 09/06/2016, 12:58 PM [facility] [address] [place], [place] [address] [phone]

## 2016-09-12 ENCOUNTER — Ambulatory Visit: Payer: Medicare Other | Admitting: Internal Medicine

## 2016-09-26 ENCOUNTER — Emergency Department (HOSPITAL_COMMUNITY)
Admission: EM | Admit: 2016-09-26 | Discharge: 2016-09-27 | Disposition: A | Discharge status: Home / Self Care | Payer: Medicare Other | Attending: Emergency Medicine | Admitting: Emergency Medicine

## 2016-09-26 ENCOUNTER — Encounter (HOSPITAL_COMMUNITY): Payer: Self-pay

## 2016-09-26 DIAGNOSIS — Z885 Allergy status to narcotic agent status: Secondary | ICD-10-CM | POA: Insufficient documentation

## 2016-09-26 DIAGNOSIS — F0391 Unspecified dementia with behavioral disturbance: Secondary | ICD-10-CM | POA: Insufficient documentation

## 2016-09-26 DIAGNOSIS — E860 Dehydration: Secondary | ICD-10-CM | POA: Insufficient documentation

## 2016-09-26 DIAGNOSIS — E119 Type 2 diabetes mellitus without complications: Secondary | ICD-10-CM | POA: Insufficient documentation

## 2016-09-26 DIAGNOSIS — R531 Weakness: Secondary | ICD-10-CM | POA: Diagnosis not present

## 2016-09-26 DIAGNOSIS — Z79899 Other long term (current) drug therapy: Secondary | ICD-10-CM | POA: Insufficient documentation

## 2016-09-26 DIAGNOSIS — R5383 Other fatigue: Secondary | ICD-10-CM | POA: Diagnosis not present

## 2016-09-26 DIAGNOSIS — I10 Essential (primary) hypertension: Secondary | ICD-10-CM | POA: Insufficient documentation

## 2016-09-26 DIAGNOSIS — R4182 Altered mental status, unspecified: Secondary | ICD-10-CM | POA: Diagnosis present

## 2016-09-26 DIAGNOSIS — Z8673 Personal history of transient ischemic attack (TIA), and cerebral infarction without residual deficits: Secondary | ICD-10-CM | POA: Insufficient documentation

## 2016-09-26 MED ORDER — SODIUM CHLORIDE 0.9 % IV BOLUS (SEPSIS)
1000.0000 mL | Freq: Once | INTRAVENOUS | Status: AC
  Administered 2016-09-27: 1000 mL via INTRAVENOUS

## 2016-09-26 NOTE — ED Triage Notes (Signed)
Per EMS, called out because pt's family stated that she has been lethargic today and HR has been low. Initial BP 100 palpated. Pt was in a-fib with a rate was 90-120s. Pt given 640ml fluid bolus, pt became more alert and HR sinus brady 56. CBG 163. BP 118/64, RR 18. spo2 92% on RA, placed on 3L up to 98%. Has hx of dementia. Lives with family.

## 2016-09-26 NOTE — ED Provider Notes (Addendum)
[facility] DEPT Provider Note   CSN: [id number] Arrival date & time: 09/26/16  2335  By signing my name below, I, Evelene Croon, attest that this documentation has been prepared under the direction and in the presence of Deno Etienne, DO . Electronically Signed: Evelene Croon, Scribe. 09/26/2016. 11:53 PM.  History   Chief Complaint Chief Complaint  Patient presents with  . Fatigue   LEVEL 5 CAVEAT DUE TO Dementia   The history is provided by the patient, the EMS personnel and a relative. No language interpreter was used.    HPI Comments:  Sara [name] is a 83 y.o. female who presents to the Emergency Department via EMS for fatigue per family. Son notes dizziness and change in mental status this evening; episode lasted ~ 20-30 minutes prior to EMS arrival. EMS reports systolic BP of 379 and note pt was in AFIB with HR in 90s-120s. She was given fluids and she improved.  Pt states she is not  sure why she is here; states she feels fine at this time. No recent cough, or fever.  Past Medical History:  Diagnosis Date  . Arthritis   . Cancer [facility])    breast - left  . Confusion   . DEMENTIA   . Diabetes mellitus    type 2  . Dyslipidemia   . Frequent falls   . Gait disturbance   . Generalized headaches   . Hearing loss   . History of nuclear stress test 2000   persantine; low risk scan   . Hypertension   . Lumbago   . Memory loss   . Mental disorder   . PAF (paroxysmal atrial fibrillation) ([place])    remote history, thought to be r/t acute cholecystits  . RBBB   . Sore throat   . Stroke [facility]) 02-23-2011   recently diagnosed with mini-strokes  . TIA (transient ischemic attack)   . Weakness     Patient Active Problem List   Diagnosis Date Noted  . Dementia with behavioral disturbance 01/19/2016  . Right thyroid nodule 06/06/2015  . Drug-induced interstitial lung disorders ([place]) 05/24/2015  . Cardiomegaly 05/17/2015  . DOE (dyspnea on exertion) 05/17/2015  . RBBB  12/14/2013  . PAF (paroxysmal atrial fibrillation) ([place]) 12/09/2012  . Expressive aphasia 04/18/2011  . Diabetes mellitus 04/18/2011  . HTN (hypertension) 04/18/2011  . Diabetes mellitus type 2 in obese ([place]) 03/03/2011  . Hypertension associated with diabetes ([place]) 03/03/2011  . Hypertension associated with diabetes ([place]) 03/03/2011  . Uncomplicated senile dementia 03/03/2011  . Osteoarthritis (arthritis due to wear and tear of joints) 03/03/2011  . Lumbar disc disease 03/03/2011  . Fall 03/03/2011  . History of breast cancer in female 02/05/2011    Past Surgical History:  Procedure Laterality Date  . ABDOMINAL HYSTERECTOMY    . BACK SURGERY     lower  . BREAST SURGERY    . CARDIAC CATHETERIZATION  02/07/2010   mild CAD  . FRACTURE SURGERY  01/2011   left arm   . LAPAROSCOPIC CHOLECYSTECTOMY  02/11/2010  . MASTECTOMY     Left breast with lymph node removeal  . TRANSTHORACIC ECHOCARDIOGRAM  04/20/2011   EF 55-60%; mildly thickened AV leaflets;   . vericose veins    . WRIST SURGERY      OB History    No data available       Home Medications    Prior to Admission medications   Medication Sig Start Date End Date Taking? Authorizing Provider  acetaminophen (TYLENOL) 325 MG tablet Take 650 mg by mouth every 4 (four) hours as needed. pain    [provider]  aspirin 81 MG tablet Take 81 mg by mouth daily.    [provider]  b complex vitamins tablet Take 1 tablet by mouth daily.    [provider]  calcium carbonate (OS-CAL) 600 MG TABS tablet Take 600 mg by mouth daily with breakfast.    [provider]  Chlorpheniramine Maleate (ALLERGY PO) Take 10 mg by mouth daily.    [provider]  diltiazem (CARDIZEM CD) 120 MG 24 hr capsule Take 120 mg by mouth daily.  01/29/11   [provider]  donepezil (ARICEPT) 10 MG tablet TAKE 1 TABLET (10 MG TOTAL) BY MOUTH DAILY. 09/06/16   Ward Givens, NP  DULoxetine (CYMBALTA) 30  MG capsule Take 1 capsule by mouth daily. 11/23/13   [provider]  FLUoxetine (PROZAC) 10 MG capsule Take 10 mg by mouth daily.    [provider]  furosemide (LASIX) 20 MG tablet Take 1 tablet (20 mg total) by mouth daily as needed for edema. 05/31/15   Hilty, Nadean Corwin, MD  KLOR-CON M20 20 MEQ tablet Take 20 mEq by mouth daily.  01/02/11   [provider]  magnesium oxide (MAG-OX) 400 MG tablet Take 400 mg by mouth daily.    [provider]  memantine (NAMENDA) 10 MG tablet Take 1 tablet (10 mg total) by mouth 2 (two) times daily. 09/06/16   Ward Givens, NP  Misc Natural Products (OSTEO BI-FLEX ADV DOUBLE ST PO) Take 1 capsule by mouth 2 (two) times daily.    [provider]  oxybutynin (DITROPAN-XL) 10 MG 24 hr tablet Take 10 mg by mouth daily. 06/22/15   [provider]  QUEtiapine (SEROQUEL) 50 MG tablet Take 1 tablet (50 mg total) by mouth at bedtime. 09/06/16   Ward Givens, NP    Family History Family History  Problem Relation Age of Onset  . Diabetes type II Son   . Emphysema Brother        smoked  . Thyroid disease Daughter   . Thyroid disease Son     Social History Social History  Substance Use Topics  . Smoking status: Never Smoker  . Smokeless tobacco: Never Used  . Alcohol use No     Allergies   Codeine   Review of Systems Review of Systems  Unable to perform ROS: Dementia     Physical Exam Updated Vital Signs BP (!) 136/55   Pulse (!) 53   Resp 18   Ht 5\' 1"  (1.549 m)   Wt 69.4 kg (153 lb)   SpO2 99%   BMI 28.91 kg/m   Physical Exam  Constitutional: She is oriented to person, place, and time. She appears well-developed and well-nourished. No distress.  HENT:  Head: Normocephalic and atraumatic.  Eyes: EOM are normal. Pupils are equal, round, and reactive to light.  Neck: Normal range of motion. Neck supple.  Cardiovascular: Regular rhythm.  Bradycardia present.  Exam reveals no gallop and  no friction rub.   No murmur heard. Pulmonary/Chest: Effort normal. She has no wheezes. She has no rales.  Abdominal: Soft. She exhibits no distension. There is no tenderness.  Musculoskeletal: She exhibits no edema or tenderness.  Neurological: She is alert and oriented to person, place, and time.  Skin: Skin is warm and dry. She is not diaphoretic.  Small skin tear to right inner  arm  Psychiatric: She has a normal mood and affect. Her behavior is normal.  Nursing note and vitals reviewed.    ED Treatments / Results  DIAGNOSTIC STUDIES:  Oxygen Saturation is 96% on RA, normal by my interpretation.     Labs (all labs ordered are listed, but only abnormal results are displayed) Labs Reviewed  COMPREHENSIVE METABOLIC PANEL - Abnormal; Notable for the following:       Result Value   Glucose, Bld 137 (*)    Calcium 8.8 (*)    Total Protein 5.7 (*)    Albumin 2.8 (*)    ALT 8 (*)    All other components within normal limits  CBC WITH DIFFERENTIAL/PLATELET - Abnormal; Notable for the following:    RBC 3.67 (*)    Hemoglobin 11.2 (*)    HCT 34.8 (*)    All other components within normal limits  I-STAT CHEM 8, ED - Abnormal; Notable for the following:    Glucose, Bld 133 (*)    Hemoglobin 10.9 (*)    HCT 32.0 (*)    All other components within normal limits  URINALYSIS, ROUTINE W REFLEX MICROSCOPIC  I-STAT CG4 LACTIC ACID, ED  I-STAT TROPOININ, ED    EKG  EKG Interpretation  Date/Time:  Wednesday September 26 2016 23:42:33 EDT Ventricular Rate:  56 PR Interval:    QRS Duration: 140 QT Interval:  474 QTC Calculation: 458 R Axis:   -54 Text Interpretation:  Sinus rhythm RBBB and LAFB No significant change since last tracing Confirmed by Deno Etienne [phone]) on 09/27/2016 12:03:33 AM       Radiology Dg Chest 2 View  Result Date: 09/27/2016 CLINICAL DATA:  Acute onset of generalized weakness. Initial encounter. EXAM: CHEST  2 VIEW COMPARISON:  Chest radiograph performed  05/23/2015 FINDINGS: The lungs are hypoexpanded. Mild bilateral airspace opacities are relatively stable from the prior study and appear to reflect chronic interstitial lung disease. No superimposed focal airspace consolidation is seen. No pleural effusion or pneumothorax is seen. The heart is mildly enlarged. Clips are noted at the left axilla. No acute osseous abnormalities are seen. Clips are noted within the right upper quadrant, reflecting prior cholecystectomy. IMPRESSION: Lungs hypoexpanded. Mild bilateral airspace opacities are relatively stable from 2017 and appear to reflect chronic interstitial lung disease. No focal airspace consolidation seen. Mild cardiomegaly. Electronically Signed   By: Garald Balding M.D.   On: 09/27/2016 00:37    Procedures Procedures (including critical care time)  Medications Ordered in ED Medications  sodium chloride 0.9 % bolus 1,000 mL (1,000 mLs Intravenous New Bag/Given 09/27/16 0007)     Initial Impression / Assessment and Plan / ED Course  I have reviewed the triage vital signs and the nursing notes.  Pertinent labs & imaging results that were available during my care of the patient were reviewed by me and considered in my medical decision making (see chart for details).     83 yo F With a chief complaint of altered mental status. This was noticed by the family this evening. Resolved prior to arrival to the ED. Patient continued to be at her baseline while in the ED. Infectious workup negative. The patient's hemoglobin is a bit lower than the last time she was seen in the ED. I discussed this with the family. She has had an issue with blood in her stool that is been ongoing for the past month or so. They're willing to do outpatient follow-up. She has a gastroenterologist as  well as a PCP. Labs reassuring. Discharge home.  1:13 AM:  I have discussed the diagnosis/risks/treatment options with the patient and family and believe the pt to be eligible for  discharge home to follow-up with PCP. We also discussed returning to the ED immediately if new or worsening sx occur. We discussed the sx which are most concerning (e.g., sudden worsening pain, fever, inability to tolerate by mouth) that necessitate immediate return. Medications administered to the patient during their visit and any new prescriptions provided to the patient are listed below.  Medications given during this visit Medications  sodium chloride 0.9 % bolus 1,000 mL (1,000 mLs Intravenous New Bag/Given 09/27/16 0007)     The patient appears reasonably screen and/or stabilized for discharge and I doubt any other medical condition or other [facility] requiring further screening, evaluation, or treatment in the ED at this time prior to discharge.    Final Clinical Impressions(s) / ED Diagnoses   Final diagnoses:  Weakness  Dehydration    New Prescriptions New Prescriptions   No medications on file   I personally performed the services described in this documentation, which was scribed in my presence. The recorded information has been reviewed and is accurate.      Deno Etienne, DO 09/27/16 [place], [place], DO 09/27/16 [phone]

## 2016-09-27 ENCOUNTER — Emergency Department (HOSPITAL_COMMUNITY): Payer: Medicare Other

## 2016-09-27 DIAGNOSIS — R531 Weakness: Secondary | ICD-10-CM | POA: Diagnosis not present

## 2016-09-27 LAB — URINALYSIS, ROUTINE W REFLEX MICROSCOPIC
BILIRUBIN URINE: NEGATIVE
Glucose, UA: NEGATIVE mg/dL
HGB URINE DIPSTICK: NEGATIVE
KETONES UR: NEGATIVE mg/dL
Leukocytes, UA: NEGATIVE
NITRITE: NEGATIVE
PH: 6 (ref 5.0–8.0)
Protein, ur: NEGATIVE mg/dL
Specific Gravity, Urine: 1.017 (ref 1.005–1.030)

## 2016-09-27 LAB — I-STAT CHEM 8, ED
BUN: 8 mg/dL (ref 6–20)
CALCIUM ION: 1.18 mmol/L (ref 1.15–1.40)
CHLORIDE: 101 mmol/L (ref 101–111)
CREATININE: 0.8 mg/dL (ref 0.44–1.00)
GLUCOSE: 133 mg/dL — AB (ref 65–99)
HCT: 32 % — ABNORMAL LOW (ref 36.0–46.0)
Hemoglobin: 10.9 g/dL — ABNORMAL LOW (ref 12.0–15.0)
POTASSIUM: 3.9 mmol/L (ref 3.5–5.1)
Sodium: 142 mmol/L (ref 135–145)
TCO2: 30 mmol/L (ref 0–100)

## 2016-09-27 LAB — COMPREHENSIVE METABOLIC PANEL
ALBUMIN: 2.8 g/dL — AB (ref 3.5–5.0)
ALT: 8 U/L — ABNORMAL LOW (ref 14–54)
ANION GAP: 6 (ref 5–15)
AST: 17 U/L (ref 15–41)
Alkaline Phosphatase: 62 U/L (ref 38–126)
BILIRUBIN TOTAL: 0.4 mg/dL (ref 0.3–1.2)
BUN: 6 mg/dL (ref 6–20)
CHLORIDE: 105 mmol/L (ref 101–111)
CO2: 30 mmol/L (ref 22–32)
Calcium: 8.8 mg/dL — ABNORMAL LOW (ref 8.9–10.3)
Creatinine, Ser: 0.82 mg/dL (ref 0.44–1.00)
GFR calc Af Amer: >60 mL/min (ref >60)
GFR calc non Af Amer: >60 mL/min (ref >60)
GLUCOSE: 137 mg/dL — AB (ref 65–99)
POTASSIUM: 3.9 mmol/L (ref 3.5–5.1)
SODIUM: 141 mmol/L (ref 135–145)
TOTAL PROTEIN: 5.7 g/dL — AB (ref 6.5–8.1)

## 2016-09-27 LAB — CBC WITH DIFFERENTIAL/PLATELET
BASOS ABS: 0 10*3/uL (ref 0.0–0.1)
Basophils Relative: 1 %
EOS PCT: 4 %
Eosinophils Absolute: 0.2 10*3/uL (ref 0.0–0.7)
HCT: 34.8 % — ABNORMAL LOW (ref 36.0–46.0)
Hemoglobin: 11.2 g/dL — ABNORMAL LOW (ref 12.0–15.0)
LYMPHS ABS: 1.4 10*3/uL (ref 0.7–4.0)
LYMPHS PCT: 28 %
MCH: 30.5 pg (ref 26.0–34.0)
MCHC: 32.2 g/dL (ref 30.0–36.0)
MCV: 94.8 fL (ref 78.0–100.0)
MONO ABS: 0.4 10*3/uL (ref 0.1–1.0)
Monocytes Relative: 8 %
NEUTROS ABS: 3 10*3/uL (ref 1.7–7.7)
Neutrophils Relative %: 59 %
Platelets: 168 10*3/uL (ref 150–400)
RBC: 3.67 MIL/uL — AB (ref 3.87–5.11)
RDW: 12.8 % (ref 11.5–15.5)
WBC: 5 10*3/uL (ref 4.0–10.5)

## 2016-09-27 LAB — I-STAT CG4 LACTIC ACID, ED: LACTIC ACID, VENOUS: 1.37 mmol/L (ref 0.5–1.9)

## 2016-09-27 LAB — I-STAT TROPONIN, ED: Troponin i, poc: 0 ng/mL (ref 0.00–0.08)

## 2016-09-27 NOTE — ED Notes (Signed)
Pt verbalized understanding of d/c instructions and has no further questions. Pt stable and NAD. Pt d/c home with family driving.  

## 2016-09-27 NOTE — ED Notes (Signed)
Pt transported to xray 

## 2017-03-11 ENCOUNTER — Ambulatory Visit: Payer: Medicare Other | Admitting: Adult Health

## 2017-03-11 ENCOUNTER — Telehealth: Payer: Self-pay | Admitting: *Deleted

## 2017-03-11 NOTE — Telephone Encounter (Signed)
Patient was no show for FU today with NP.

## 2017-03-12 ENCOUNTER — Encounter: Payer: Self-pay | Admitting: Adult Health

## 2017-05-17 DEATH — deceased
